# Patient Record
Sex: Female | Born: 1960 | Hispanic: No | Smoking: Never smoker
Health system: Southern US, Community
[De-identification: ages and names within clinical notes are randomized; demographics above are authoritative.]

## PROBLEM LIST (undated history)

## (undated) DIAGNOSIS — I10 Essential (primary) hypertension: Secondary | ICD-10-CM

## (undated) DIAGNOSIS — I82409 Acute embolism and thrombosis of unspecified deep veins of unspecified lower extremity: Secondary | ICD-10-CM

## (undated) DIAGNOSIS — Q632 Ectopic kidney: Secondary | ICD-10-CM

## (undated) DIAGNOSIS — K219 Gastro-esophageal reflux disease without esophagitis: Secondary | ICD-10-CM

## (undated) DIAGNOSIS — T7840XA Allergy, unspecified, initial encounter: Secondary | ICD-10-CM

## (undated) DIAGNOSIS — I959 Hypotension, unspecified: Secondary | ICD-10-CM

## (undated) DIAGNOSIS — R634 Abnormal weight loss: Secondary | ICD-10-CM

## (undated) DIAGNOSIS — E119 Type 2 diabetes mellitus without complications: Secondary | ICD-10-CM

## (undated) DIAGNOSIS — Z5189 Encounter for other specified aftercare: Secondary | ICD-10-CM

## (undated) DIAGNOSIS — T8610 Unspecified complication of kidney transplant: Secondary | ICD-10-CM

## (undated) DIAGNOSIS — D649 Anemia, unspecified: Secondary | ICD-10-CM

## (undated) DIAGNOSIS — R011 Cardiac murmur, unspecified: Secondary | ICD-10-CM

## (undated) DIAGNOSIS — D689 Coagulation defect, unspecified: Secondary | ICD-10-CM

## (undated) DIAGNOSIS — M1990 Unspecified osteoarthritis, unspecified site: Secondary | ICD-10-CM

## (undated) DIAGNOSIS — D61818 Other pancytopenia: Secondary | ICD-10-CM

## (undated) DIAGNOSIS — I639 Cerebral infarction, unspecified: Secondary | ICD-10-CM

## (undated) DIAGNOSIS — N184 Chronic kidney disease, stage 4 (severe): Secondary | ICD-10-CM

## (undated) DIAGNOSIS — H269 Unspecified cataract: Secondary | ICD-10-CM

## (undated) DIAGNOSIS — N189 Chronic kidney disease, unspecified: Secondary | ICD-10-CM

## (undated) DIAGNOSIS — R569 Unspecified convulsions: Secondary | ICD-10-CM

## (undated) DIAGNOSIS — T451X5A Adverse effect of antineoplastic and immunosuppressive drugs, initial encounter: Secondary | ICD-10-CM

## (undated) HISTORY — DX: Allergy, unspecified, initial encounter: T78.40XA

## (undated) HISTORY — DX: Hypercalcemia: E83.52

## (undated) HISTORY — DX: Hypomagnesemia: E83.42

## (undated) HISTORY — DX: Chronic kidney disease, unspecified: N18.9

## (undated) HISTORY — DX: Cerebral infarction, unspecified: I63.9

## (undated) HISTORY — DX: Anemia, unspecified: D64.9

## (undated) HISTORY — DX: Ectopic kidney: Q63.2

## (undated) HISTORY — DX: Acute embolism and thrombosis of unspecified deep veins of unspecified lower extremity: I82.409

## (undated) HISTORY — DX: Unspecified cataract: H26.9

## (undated) HISTORY — PX: OTHER SURGICAL HISTORY: SHX169

## (undated) HISTORY — DX: Hypotension, unspecified: I95.9

## (undated) HISTORY — DX: Encounter for other specified aftercare: Z51.89

## (undated) HISTORY — DX: Cardiac murmur, unspecified: R01.1

## (undated) HISTORY — DX: Coagulation defect, unspecified: D68.9

## (undated) HISTORY — DX: Gastro-esophageal reflux disease without esophagitis: K21.9

## (undated) HISTORY — PX: URETER SURGERY: SHX823

## (undated) HISTORY — DX: Unspecified osteoarthritis, unspecified site: M19.90

## (undated) HISTORY — DX: Unspecified convulsions: R56.9

## (undated) HISTORY — DX: Type 2 diabetes mellitus without complications: E11.9

## (undated) HISTORY — DX: Essential (primary) hypertension: I10

---

## 1991-03-01 HISTORY — PX: PERITONEAL CATHETER INSERTION: SHX2223

## 2002-02-28 DIAGNOSIS — I639 Cerebral infarction, unspecified: Secondary | ICD-10-CM

## 2002-02-28 HISTORY — DX: Cerebral infarction, unspecified: I63.9

## 2003-03-01 HISTORY — PX: KIDNEY TRANSPLANT: SHX239

## 2009-02-28 DIAGNOSIS — R569 Unspecified convulsions: Secondary | ICD-10-CM

## 2009-02-28 HISTORY — DX: Unspecified convulsions: R56.9

## 2010-09-02 ENCOUNTER — Emergency Department (HOSPITAL_COMMUNITY): Payer: Medicare Other

## 2010-09-02 ENCOUNTER — Inpatient Hospital Stay (HOSPITAL_COMMUNITY)
Admission: EM | Admit: 2010-09-02 | Discharge: 2010-09-07 | DRG: 683 | Disposition: A | Discharge status: Home / Self Care | Payer: Medicare Other | Attending: Internal Medicine | Admitting: Internal Medicine

## 2010-09-02 DIAGNOSIS — R0609 Other forms of dyspnea: Secondary | ICD-10-CM | POA: Diagnosis present

## 2010-09-02 DIAGNOSIS — N185 Chronic kidney disease, stage 5: Secondary | ICD-10-CM | POA: Diagnosis present

## 2010-09-02 DIAGNOSIS — E872 Acidosis, unspecified: Secondary | ICD-10-CM | POA: Diagnosis present

## 2010-09-02 DIAGNOSIS — Z8673 Personal history of transient ischemic attack (TIA), and cerebral infarction without residual deficits: Secondary | ICD-10-CM

## 2010-09-02 DIAGNOSIS — K439 Ventral hernia without obstruction or gangrene: Secondary | ICD-10-CM | POA: Diagnosis present

## 2010-09-02 DIAGNOSIS — D638 Anemia in other chronic diseases classified elsewhere: Secondary | ICD-10-CM | POA: Diagnosis present

## 2010-09-02 DIAGNOSIS — Z7982 Long term (current) use of aspirin: Secondary | ICD-10-CM

## 2010-09-02 DIAGNOSIS — N390 Urinary tract infection, site not specified: Secondary | ICD-10-CM | POA: Diagnosis present

## 2010-09-02 DIAGNOSIS — N179 Acute kidney failure, unspecified: Principal | ICD-10-CM | POA: Diagnosis present

## 2010-09-02 DIAGNOSIS — R0989 Other specified symptoms and signs involving the circulatory and respiratory systems: Secondary | ICD-10-CM | POA: Diagnosis present

## 2010-09-02 DIAGNOSIS — R197 Diarrhea, unspecified: Secondary | ICD-10-CM | POA: Diagnosis present

## 2010-09-02 DIAGNOSIS — Z9119 Patient's noncompliance with other medical treatment and regimen: Secondary | ICD-10-CM

## 2010-09-02 DIAGNOSIS — Z91199 Patient's noncompliance with other medical treatment and regimen due to unspecified reason: Secondary | ICD-10-CM

## 2010-09-02 DIAGNOSIS — E876 Hypokalemia: Secondary | ICD-10-CM | POA: Diagnosis present

## 2010-09-02 DIAGNOSIS — Z940 Kidney transplant status: Secondary | ICD-10-CM

## 2010-09-02 LAB — PROTIME-INR: INR: 1.02 (ref 0.00–1.49)

## 2010-09-02 LAB — DIFFERENTIAL
Eosinophils Absolute: 0 10*3/uL (ref 0.0–0.7)
Lymphs Abs: 1.2 10*3/uL (ref 0.7–4.0)
Monocytes Absolute: 0.2 10*3/uL (ref 0.1–1.0)
Neutrophils Relative %: 75 % (ref 43–77)

## 2010-09-02 LAB — ABO/RH: ABO/RH(D): A NEG

## 2010-09-02 LAB — CBC
HCT: 16.3 % — ABNORMAL LOW (ref 36.0–46.0)
Hemoglobin: 5.2 g/dL — CL (ref 12.0–15.0)
MCH: 24.4 pg — ABNORMAL LOW (ref 26.0–34.0)
RBC: 2.13 MIL/uL — ABNORMAL LOW (ref 3.87–5.11)

## 2010-09-02 LAB — POCT I-STAT, CHEM 8
BUN: 112 mg/dL — ABNORMAL HIGH (ref 6–23)
Creatinine, Ser: 7.1 mg/dL — ABNORMAL HIGH (ref 0.50–1.10)
Sodium: 141 mEq/L (ref 135–145)
TCO2: 6 mmol/L (ref 0–100)

## 2010-09-02 LAB — SAMPLE TO BLOOD BANK

## 2010-09-02 NOTE — H&P (Signed)
[name], [name] NO.:  [id number]  MEDICAL RECORD NO.:  [id number]  LOCATION:  [facility]                         FACILITY:  [facility]  PHYSICIAN:  Domenic Polite, MD     DATE OF BIRTH:  May 11, 1960  DATE OF ADMISSION:  09/02/2010 DATE OF DISCHARGE:                             HISTORY & PHYSICAL   PRIMARY CARE PHYSICIAN:  Unassigned at [facility] in [place], [place].  TRANSPLANT NEPHROLOGIST:  Dr. Freada Bergeron and Dr. Franchot Heidelberg, both at [facility] and [facility] in [place], [place].  CHIEF COMPLAINT:  Weakness and dyspnea on exertion.  HISTORY OF PRESENT ILLNESS:  [name] is a 50 year old female with history of chronic kidney disease, cadaveric renal transplant back in 2005, anemia of chronic disease who moved from [place] to [place] in October 2011, since then has no medical followup and blood work.  She was followed closely by the transplant physicians while she was in [place] and also used to get Procrit shots every other week for her anemia.  She reports that over the last several weeks she has noticed increased fatigue and weakness and dyspnea on exertion.  On evaluation in the ER, she was found to have a hemoglobin of 6.1 and a creatinine of 7.1.  Her last recorded creatinine back in August 2011 was 2.9 and her hemoglobin at that time was 10.2 while on Aranesp.  PAST MEDICAL HISTORY: 1. Congenital kidney disease. 2. Status post cadaveric kidney transplant in 2005. 3. History of CVA. 4. History of TIA. 5. Anemia of chronic disease supposed to be on erythropoietin shots. 6. History of peritoneal dialysis in the past. 7. Past history of ovarian cyst status post removal. 8. History of angina.  PAST SURGICAL HISTORY:  Significant for: 1. Urethral reconstruction. 2. Ovarian cyst removal. 3. C-section. 4. History of peritoneal dialysis catheter. 5. Cadaveric renal transplant in 2005.  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  No history of tobacco or alcohol use.  ALLERGIES:  ALLEGRA, CEPHALOSPORIN, and ERYTHROMYCIN.  MEDICATIONS INCLUDE: Prograf, cellcept, HCTZ, Aspirin, and supplements doses need to be confirmed.  REVIEW OF SYSTEMS:  Positive for diarrhea which has been ongoing intermittently for close to 9 months now.  PHYSICAL EXAMINATION:  VITAL SIGNS:  Temperature is 98.3, pulse is 86, blood pressure 115/63, respirations 20, satting 100% on room air. GENERAL:  This is an averagely built female, lying on the stretcher in no acute distress. HEENT:  Pupils round, reactive to light.  Extraocular movements intact. NECK:  No JVD or lymphadenopathy. CARDIOVASCULAR:  S1 and S2, regular rate and rhythm, mildly tachycardic. LUNGS:  Clear to auscultation bilaterally. ABDOMEN:  Soft, nontender.  Positive bowel sounds.  No organomegaly. EXTREMITIES:  With no edema, clubbing, or cyanosis.  REVIEW OF LABORATORY DATA:  White count of 5.7, hemoglobin 6.1, platelets 227, MCV is 76.  Differential is normal.  Coags normal.  BMET: Sodium of 141, potassium 3.0, chloride 120, bicarb of 6, BUN 112, creatinine 7.1, ionized calcium 0.9.  ASSESSMENT AND PLAN:  [name] is a 50 year old female with: 1. Acute renal failure, progressive chronic kidney disease. 2. Metabolic acidosis 3. Status post cadaveric kidney transplant. 4. Anemia of chronic disease, w/ possible Iron deficiency and reported suspicion of Myeloma 4. Prior history of suspicion of light chain disease(Myeloma). 5. History of transient ischemic attack. 6. History of chronic diarrhea for 9 months. 7. Noncompliance w/ medical follow up.  PLAN: 1. We will admit her to the telemetry floor, check anemia panel, type     and cross and transfuse 1 unit of leukoreduced PRBC, check SPEP,      and check an immunofixation, free light chain assays.   Also, for her acute renal failure,     put her on IV fluids w/ bicarb, get an ultrasound of her transplant     kidney, strict I/Os, check a Prograf level, and get a Renal consult. 2. For her diarrhea, it is possible that it could be secondary to her     CellCept; however, because she is immunosuppressed, we will check     for Cryptosporidium, Isospora, etc.   Further management as condition evolves.   At some point, this patient will best be served in a tertiary transplant facility.     Domenic Polite, MD     PJ/MEDQ  D:  09/02/2010  T:  09/02/2010  Job:  [id number]  Electronically Signed by Domenic Polite  on 09/02/2010 10:35:56 PM

## 2010-09-03 ENCOUNTER — Inpatient Hospital Stay (HOSPITAL_COMMUNITY): Payer: Medicare Other

## 2010-09-03 ENCOUNTER — Encounter (HOSPITAL_COMMUNITY): Payer: PRIVATE HEALTH INSURANCE

## 2010-09-03 LAB — CBC
Hemoglobin: 6.3 g/dL — CL (ref 12.0–15.0)
MCHC: 33.2 g/dL (ref 30.0–36.0)
RBC: 2.33 MIL/uL — ABNORMAL LOW (ref 3.87–5.11)

## 2010-09-03 LAB — BASIC METABOLIC PANEL
BUN: 84 mg/dL — ABNORMAL HIGH (ref 6–23)
BUN: 84 mg/dL — ABNORMAL HIGH (ref 6–23)
CO2: 17 mEq/L — ABNORMAL LOW (ref 19–32)
Calcium: 6.5 mg/dL — ABNORMAL LOW (ref 8.4–10.5)
Chloride: 105 mEq/L (ref 96–112)
Creatinine, Ser: 5.88 mg/dL — ABNORMAL HIGH (ref 0.50–1.10)
Creatinine, Ser: 5.56 mg/dL — ABNORMAL HIGH (ref 0.50–1.10)
GFR calc non Af Amer: 7 mL/min — ABNORMAL LOW (ref >60)
GFR calc non Af Amer: 8 mL/min — ABNORMAL LOW (ref >60)
Glucose, Bld: 101 mg/dL — ABNORMAL HIGH (ref 70–99)
Glucose, Bld: 110 mg/dL — ABNORMAL HIGH (ref 70–99)
Potassium: 3.3 mEq/L — ABNORMAL LOW (ref 3.5–5.1)
Sodium: 137 mEq/L (ref 135–145)
Sodium: 140 mEq/L (ref 135–145)

## 2010-09-03 LAB — URINALYSIS, ROUTINE W REFLEX MICROSCOPIC
Nitrite: POSITIVE — AB
Specific Gravity, Urine: 1.009 (ref 1.005–1.030)
Urobilinogen, UA: 0.2 mg/dL (ref 0.0–1.0)

## 2010-09-03 LAB — HEPATIC FUNCTION PANEL
ALT: 13 U/L (ref 0–35)
AST: 15 U/L (ref 0–37)
Bilirubin, Direct: 0.1 mg/dL (ref 0.0–0.3)
Total Protein: 5.8 g/dL — ABNORMAL LOW (ref 6.0–8.3)

## 2010-09-03 LAB — URINE MICROSCOPIC-ADD ON

## 2010-09-03 LAB — IRON AND TIBC: UIBC: <55 ug/dL

## 2010-09-03 LAB — LACTATE DEHYDROGENASE: LDH: 300 U/L — ABNORMAL HIGH (ref 94–250)

## 2010-09-03 LAB — MRSA PCR SCREENING: MRSA by PCR: NEGATIVE

## 2010-09-04 LAB — CBC
HCT: 22.2 % — ABNORMAL LOW (ref 36.0–46.0)
Hemoglobin: 8.1 g/dL — ABNORMAL LOW (ref 12.0–15.0)
Hemoglobin: 8.7 g/dL — ABNORMAL LOW (ref 12.0–15.0)
MCH: 28.9 pg (ref 26.0–34.0)
MCH: 27.5 pg (ref 26.0–34.0)
MCHC: 36.5 g/dL — ABNORMAL HIGH (ref 30.0–36.0)
MCHC: 34.5 g/dL (ref 30.0–36.0)

## 2010-09-04 LAB — BASIC METABOLIC PANEL
BUN: 81 mg/dL — ABNORMAL HIGH (ref 6–23)
BUN: 79 mg/dL — ABNORMAL HIGH (ref 6–23)
CO2: 20 mEq/L (ref 19–32)
Calcium: 6.2 mg/dL — CL (ref 8.4–10.5)
Chloride: 107 mEq/L (ref 96–112)
Creatinine, Ser: 5.04 mg/dL — ABNORMAL HIGH (ref 0.50–1.10)
Creatinine, Ser: 4.95 mg/dL — ABNORMAL HIGH (ref 0.50–1.10)
GFR calc non Af Amer: 9 mL/min — ABNORMAL LOW (ref >60)
Glucose, Bld: 139 mg/dL — ABNORMAL HIGH (ref 70–99)
Sodium: 143 mEq/L (ref 135–145)

## 2010-09-04 LAB — POCT OCCULT BLOOD STOOL (DEVICE): Fecal Occult Bld: NEGATIVE

## 2010-09-04 LAB — RENAL FUNCTION PANEL
BUN: 83 mg/dL — ABNORMAL HIGH (ref 6–23)
Calcium: 5.6 mg/dL — CL (ref 8.4–10.5)
Creatinine, Ser: 5.3 mg/dL — ABNORMAL HIGH (ref 0.50–1.10)
Glucose, Bld: 141 mg/dL — ABNORMAL HIGH (ref 70–99)
Phosphorus: 0.8 mg/dL — CL (ref 2.3–4.6)

## 2010-09-04 LAB — URINE CULTURE
Colony Count: NO GROWTH
Culture  Setup Time: 201207061643

## 2010-09-04 LAB — PHOSPHORUS: Phosphorus: 2.4 mg/dL (ref 2.3–4.6)

## 2010-09-04 LAB — MAGNESIUM: Magnesium: 1 mg/dL — ABNORMAL LOW (ref 1.5–2.5)

## 2010-09-05 LAB — RENAL FUNCTION PANEL
Albumin: 2.8 g/dL — ABNORMAL LOW (ref 3.5–5.2)
Calcium: 5.8 mg/dL — CL (ref 8.4–10.5)
Creatinine, Ser: 4.95 mg/dL — ABNORMAL HIGH (ref 0.50–1.10)
GFR calc non Af Amer: 9 mL/min — ABNORMAL LOW (ref >60)
Phosphorus: 2.7 mg/dL (ref 2.3–4.6)

## 2010-09-05 LAB — CBC
MCH: 27.7 pg (ref 26.0–34.0)
MCHC: 34.2 g/dL (ref 30.0–36.0)
MCV: 81.1 fL (ref 78.0–100.0)
Platelets: 171 10*3/uL (ref 150–400)
RDW: 16.1 % — ABNORMAL HIGH (ref 11.5–15.5)
WBC: 6.7 10*3/uL (ref 4.0–10.5)

## 2010-09-06 LAB — KAPPA/LAMBDA LIGHT CHAINS
Kappa free light chain: 9.79 mg/dL — ABNORMAL HIGH (ref 0.33–1.94)
Kappa, lambda light chain ratio: 1.1 (ref 0.26–1.65)
Lambda free light chains: 8.89 mg/dL — ABNORMAL HIGH (ref 0.57–2.63)

## 2010-09-06 LAB — CROSSMATCH
ABO/RH(D): A NEG
Unit division: 0
Unit division: 0

## 2010-09-06 LAB — CBC
HCT: 23.5 % — ABNORMAL LOW (ref 36.0–46.0)
MCH: 27.6 pg (ref 26.0–34.0)
MCV: 83 fL (ref 78.0–100.0)
Platelets: 167 10*3/uL (ref 150–400)
RDW: 16.6 % — ABNORMAL HIGH (ref 11.5–15.5)

## 2010-09-06 LAB — RENAL FUNCTION PANEL
Albumin: 2.9 g/dL — ABNORMAL LOW (ref 3.5–5.2)
BUN: 72 mg/dL — ABNORMAL HIGH (ref 6–23)
Calcium: 6.8 mg/dL — ABNORMAL LOW (ref 8.4–10.5)
Creatinine, Ser: 4.6 mg/dL — ABNORMAL HIGH (ref 0.50–1.10)
GFR calc non Af Amer: 10 mL/min — ABNORMAL LOW (ref >60)

## 2010-09-06 LAB — GIARDIA/CRYPTOSPORIDIUM SCREEN(EIA): Cryptosporidium Screen (EIA): NEGATIVE

## 2010-09-07 LAB — PROTEIN ELECTROPH W RFLX QUANT IMMUNOGLOBULINS
Albumin ELP: 58.5 % (ref 55.8–66.1)
Alpha-1-Globulin: 7.2 % — ABNORMAL HIGH (ref 2.9–4.9)
Beta 2: 4.4 % (ref 3.2–6.5)
Total Protein ELP: 5.8 g/dL — ABNORMAL LOW (ref 6.0–8.3)

## 2010-09-07 LAB — RENAL FUNCTION PANEL
Albumin: 2.8 g/dL — ABNORMAL LOW (ref 3.5–5.2)
BUN: 63 mg/dL — ABNORMAL HIGH (ref 6–23)
Calcium: 6.8 mg/dL — ABNORMAL LOW (ref 8.4–10.5)
Glucose, Bld: 94 mg/dL (ref 70–99)
Phosphorus: 3.2 mg/dL (ref 2.3–4.6)
Potassium: 3.3 mEq/L — ABNORMAL LOW (ref 3.5–5.1)

## 2010-09-07 LAB — VITAMIN D 25 HYDROXY (VIT D DEFICIENCY, FRACTURES): Vit D, 25-Hydroxy: 19 ng/mL — ABNORMAL LOW (ref 30–89)

## 2010-09-08 LAB — VITAMIN D 1,25 DIHYDROXY
Vitamin D2 1, 25 (OH)2: <8 pg/mL
Vitamin D3 1, 25 (OH)2: 19 pg/mL

## 2010-09-09 LAB — UIFE/LIGHT CHAINS/TP QN, 24-HR UR
Alpha 1, Urine: DETECTED — AB
Free Kappa Lt Chains,Ur: 38.2 mg/dL — ABNORMAL HIGH (ref 0.14–2.42)
Free Kappa/Lambda Ratio: 3.44 ratio (ref 2.04–10.37)
Gamma Globulin, Urine: DETECTED — AB
Total Protein, Urine: 51.5 mg/dL

## 2010-09-12 NOTE — Discharge Summary (Signed)
[name], ROSDAHL                ACCOUNT NO.:  [id number]  MEDICAL RECORD NO.:  [id number]  LOCATION:  6715                         FACILITY:  [facility]  PHYSICIAN:  Niel Hummer, MD    DATE OF BIRTH:  1960/06/29  DATE OF ADMISSION:  09/02/2010 DATE OF DISCHARGE:  09/07/2010                              DISCHARGE SUMMARY   DISCHARGE DIAGNOSES: 1. Acute on chronic kidney disease, prerenal, component of failing     renal transplant stage V. 2. Chronic diarrhea, treated with Flagyl for presumptive Clostridium     difficile. 3. Urinary tract infection. 4. Multiple electrolyte abnormalities, hypocalcemia, hypomagnesemia,     and hypophosphatemia. 5. Anemia of chronic disease.  DISCHARGE MEDICATIONS: 1. Calcium carbonate 250 mg 1 tablet b.i.d. 2. Cipro 500 mg p.o. daily for 4 more days. 3. Magnesium oxide 400 mg p.o. b.i.d. 4. Metronidazole 500 mg p.o. every 8 hours. 5. Paricalcitol 2 mcg p.o. daily. 6. Potassium chloride 10 mEq p.o. daily. 7. Sodium bicarb 650 mg p.o. 3 times a day. 8. Aspirin 81 mg p.o. daily. 9. CellCept 500 mg 1 tablet by mouth twice daily. 10.Ferrous sulfate 325 one tablet by mouth twice daily. 11.Multivitamins 1 tablet by mouth daily. 12.Tacrolimus 1 mg 3 tablets by mouth twice daily.  MEDICATIONS STOPPED DURING HOSPITALIZATION:  Hydrochlorothiazide.  DISPOSITION AND FOLLOWUP:  [name] will need to follow up with Dr. Posey Pronto on September 16, 2010, but she will also get lab work this next Monday and will need to fax to Dr. Serita Grit office.  She will need to follow with her primary care physician.  If her diarrhea continues, she might need a colonoscopy.  During her appointment with Dr. Posey Pronto, will need to assess for urinary tract infection and the need for chronic antibiotic suppressive therapy.  She will need a hemoglobin level to follow anemia.  Also, vitamin D level and protein electrophoresis will need to be followed.  She might need referral to  hematologist depending on those results.  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  She most recently has had baseline creatinine ranging from mid to lower 2 range as per history.  She stated that she has received routine transplant care in [place] on an annual basis for the last few years and has been contemplating moving permanently to [place] to be close to her son.  She was admitted yesterday with about 2-3 weeks' history of increasing weakness with poor appetite and a 70-month history of diarrhea.  She states that she has had this diarrhea since October 2011, which is the duration of time that she has been here in [place].  She reports diminished appetite at least for the past 2 weeks.  No vomiting, hematochezia, or melena.  She is taking hydrochlorothiazide daily.  HOSPITAL COURSE: 1. Acute on chronic renal failure.  The patient was admitted to the     Step-Down Unit.  She was started on IV fluid and bicarb drip.  Over  course of hospitalization, she was on normal saline lock.  Her     bicarb on admission was less than 7 with IV bicarb increased to     normal range.  On the day of discharge, her bicarb was 18, so she     was started on sodium bicarb.  Initially, her acute on chronic     renal failure was thought secondary to hemodynamically mediated due     to dehydration and poor oral intake.  She had an ultrasound that     was negative.  Also, there was a consideration that this is also     progression of the chronic renal failure and failing renal     transplant.  Dr. Jonnie Finner spoke with one of her transplant doctor at     [place] and it seems that the patient's creatinine was at 3 last     fall.  Before that, it was 2-2.5 range.   The patient's Prograf     level was at 7 this admission. We will continue with the same Prograf dose.  We     will continue with her immunosuppressive therapy.  She received 3     days of IV Solu-Medrol.  The patient's renal function has     stabilized in the 4.3 range, but this is likely stage V per Dr.     Sanda Klein recommendation.  The patient will follow up with Dr. Posey Pronto     on September 16, 2010.  She will have lab work done on Monday and this     result will be faxed to Dr. Serita Grit office.  Also, the patient had     multiple electrolytes abnormality, hypomagnesemia, hypocalcemia,     and hypophosphatemia, so phosphate was repleted and it was     normalized.  She continued to have low calcium level.  She received     multiple doses IV.  She will be discharged on oral supplement and     she was started on Zemplar.  For hypomagnesemia, she will be     discharged on magnesium oxide.  She did not have any worsening of     her diarrhea with the magnesium oxide. 2. Diarrhea, chronic.  The patient had Giardia negative.  C. diff was     negative.  Her diarrhea has improved on empiric treatment with     Flagyl.  She was taking clindamycin for suppressive therapy for     history of urinary tract infection.  Her C. diff was negative, but     I will give her 7 more days of Flagyl treatment empirically.  Her     diarrhea has improved.  I cannot discard that her diarrhea was     secondary to antibiotic use.  She will need to follow up with her     primary care physician and if this diarrhea persists she will need     a screening colonoscopy. 3. Urinary tract infection.  The patient had a UA with white blood     cell 21-50 and positive nitrate.  She was treated empirically with     ciprofloxacin IV for 4 days, then this was transitioned to p.o.  I     will give her 4 more days of oral antibiotics.  She will need to     follow with her nephrologist and primary care physician to     determine if she  needs long-term suppressive therapy.  I will avoid  continued antibiotics for long term at this time due to frequent     diarrhea to avoid a C. diff infection.  She will need a repeat UA.     Also, her UA did not grow anything. 4. Anemia.  This is likely anemia of chronic disease.  The patient has     not been receiving any epoetin for a while.  She received a total     of 3 units of packed red blood cell.  On admission, her hemoglobin     was 6.3.  It then stabilized at 8.  It seems that her baseline is     8.  The day prior to discharge, her hemoglobin was 7.8.  She was     feeling fine and she was asymptomatic.  She was started on ferrous     sulfate.  She was also started on darbepoetin.  She received a dose     of darbepoetin on September 05, 2010.  She will need this weekly and she     will need to follow up with her nephrologist for this. 5. Acute on chronic renal failure.  The patient had a questionable     history of multiple myeloma.  She had a bone scan that was     negative.  She had a kappa/lambda assay that was 9.7.  Suspicion     for multiple myeloma is less likely.  She will follow up with her     nephrologist for further evaluation to see if the patient will need     to be referred to hematologist.  The urine electrophoresis is     pending at this time.  Nephrologist will follow this result.  On the day of discharge, the patient was in improved condition.  Blood pressure 107/69, sat 99 on room air, respirations 20, pulse 93, and temperature 98.  DISCHARGE LABORATORY DATA:  Sodium 146, potassium 3.3, chloride 114, bicarb 18, glucose 94, BUN 63, creatinine 4.3, mag 1.1.  Kappa  9.7. Hemoglobin 7.8, white blood cell 6.3, and platelet 167.     Niel Hummer, MD     BR/MEDQ  D:  09/07/2010  T:  09/08/2010  Job:  [id number]  Electronically Signed by Niel Hummer MD on 09/12/2010 07:24:03 PM

## 2010-10-13 NOTE — Consult Note (Signed)
[name], [name] NO.:  [id number]  MEDICAL RECORD NO.:  [id number]  LOCATION:  2606                         FACILITY:  [facility]  PHYSICIAN:  Elmarie Shiley, MD          DATE OF BIRTH:  May 17, 1960  DATE OF CONSULTATION:  09/03/2010 DATE OF DISCHARGE:                                [place]   Nephrology is consulted by Dr. Niel Hummer of [facility] for the assistance with management of acute renal failure on chronic kidney disease, stage III transplant.  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  She most recently has had baseline creatinine ranging in the mid to lower two range.  She states that she has received routine transplant care in [place] on an annual basis for the last few years and has been contemplating moving permanently to the [place] area to be close to her son.  She was admitted yesterday with about 2-3 week history of increasing weakness and lethargy with poor appetite and a 41-month history of diarrhea.  She states that she has had this diarrhea since October 2011, which is a duration of time that she has been here in [place].  She reports diminished appetite at least for the last 2 weeks ingesting only about 16 fluid ounces per day.  She denies any nausea or vomiting and denies any hematochezia, melena or hematemesis. She does state that she was aberrantly taking her hydrochlorothiazide 12.5 mg p.o. daily and not missing any doses of her anti-rejection medications.  With regards to her renal transplant, she was previously on peritoneal dialysis for 13 years prior to getting a cadaveric kidney transplant. She was induced with Alemtuzumab and received only 3 doses of  Solu-Medrol postoperatively.  She has been on a steroid-free regime.  She states that prior to April 2011, her creatinine used to range from 1.6-1.8, however in April, she was admitted to a hospital in [place] with pyelonephritis where creatinine peaked at about 5.0 and upon discharge never really got back to its baseline and has been ranging from about 2- 2.5.  Apparently, a recorded creatinine which was verified on admission was 2.9 in August 2011.  Back in April 2011, when she was admitted to [facility] in [facility], she was diagnosed to have a pyelonephritis.  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  At that time, plans were made to do a bone marrow biopsy.  However, she elected to have this done in [place].  Upon reaching [place], repeat serum and urine free light chain assay was done and at that time found to be negative and bone marrow biopsy not done.  She has been anemic at least as far back as August 2011, and occasionally follows up with [facility] in [place], [place].  At some point, was on Aranesp therapy.  With regards to her  transplant allograft, she reports that she has had about 4-5 allograft biopsies after having slow to recover renal function after transplant.  She does not recall being treated for any acute rejection episodes.  PAST MEDICAL HISTORY: 1. Status post cadaveric kidney transplant in 2005.  Currently,     chronic kidney disease, stage III transplant. 2. End-stage renal disease from congenitally atrophic kidney-pelvic     kidney previously on peritoneal dialysis for 13 years (1993-2005). 3. History of cerebrovascular accident. 4. History of transient ischemic attack. 5. Anemia of chronic disease. 6. 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 7. History of ovarian cyst, status post oophorectomy. 8. History of  ACS  MEDICATIONS: 1. CellCept 500 mg p.o. b.i.d. 2. Prograf 3 mg p.o. b.i.d. 3. Hydrochlorothiazide 12.5 mg p.o. daily. 4. Aspirin 325 mg p.o. daily. 5. Clindamycin topical as needed. 6. Ferrous sulfate 325 mg p.o. b.i.d. 7. Multivitamin daily. 8. Sodium bicarbonate 650 mg 2 tablets a day.  ALLERGIES:  CEPHALOSPORINS cause nausea and vomiting, ERYTHROMYCIN causes nausea and vomiting.  SOCIAL HISTORY:  Divorced and currently resides in [place] to be close to her son.  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  She is also Engineer, maintenance.  Denies any tobacco, alcohol or illicit drug abuse.  FAMILY HISTORY:  Noncontributory.  REVIEW OF SYSTEMS:  Negative other than the HPI listed above for all 14 systems reviewed.  PHYSICAL EXAMINATION:  VITAL SIGNS:  Blood pressure 101/74, heart rate of 95 beats per minute, respiratory rate of 17, temperature 98.6 degrees Fahrenheit and oxygen saturation 98% on room air.  GENERAL EVALUATION: Medium build middle-aged [place] American woman resting comfortably in bed. HEAD, NECK AND ENT SYSTEMS:  Head is normocephalic and atraumatic. Pupils are bilaterally equal and reactive to light.  Extraocular muscle movements are normal. NECK:  Supple without any obvious JVD or goiter.  No lymphadenopathy. No bruit. CARDIOVASCULAR ASSESSMENT:  Pulse is currently regular in rate and rhythm.  Heart sounds S1 and S2 are obscured by an ejection systolic murmur. RESPIRATORY EVALUATION:  Both lung fields are clear to auscultation.  No rales, retractions or rhonchi. ABDOMEN:  Soft, flat, mild tenderness over the allograft site.  No obvious organomegaly.  Bowel sounds are normal. EXTREMITIES:  Trace to 1+ edema is present by pedally. SKIN:  No obvious rash, lumps or bumps.  Dry skin and dry oral mucosa is appreciated.  LABORATORY DATA:  Sodium 137, potassium 3.3, bicarbonate 7, BUN  88, (creatinine 6.2 down from 7.1 yesterday), calcium 6.8, hemoglobin 6.3, hematocrit 19, white cell count 5700, platelet count 186,000, ferritin 716.  Iron saturation too low to calculate.  Urinalysis shows a specific gravity of 1009, large amount of leukocyte esterase, positive nitrites, small amount of blood, urine microscopy shows 25-30 white cells per high- power field.  Urine pH is 7.0.  Red cells 0-2 are seen per high-power field.  RADIOLOGICAL EVALUATIONS:  Renal ultrasound of the allograft was done that shows echogenic allograft kidney with slight loss of cortical medullary differentiation.  No hydronephrosis is seen.  No obvious parenchymal masses or cyst are seen.  SKELETAL SURVEY:  Pending.  ASSESSMENT AND PLAN: 1. Acute renal failure on chronic kidney disease, stage III     transplant.  Suspect that her acute renal insufficiency is     primarily volume driven based on the history and timeline of     events.  Her allograft ultrasound was done and  is negative for any     pathological obstruction and shows echogenicity consistent with     chronic kidney disease.  I would continue aggressive intravenous     fluid resuscitation with isotonic sodium bicarbonate given her     history of GI losses that appears to be chronic.  She does have     significant metabolic acidosis that should be further aided by this     isotonic bicarbonate.  I would replete potassium anticipating some     intercellular shifts and agree with discontinuation of     hydrochlorothiazide.  I will continue her Prograf and CellCept and     adjust her doses based on trough Prograf levels.  I will     empirically treat her with 3 days of pulse corticosteroids at the     low dose of 60 mg for any benefit that not suspecting any rejection     at this time.  No recent NSAID use or intravenous contrast use is     noted and there is no acute hemodialysis needs identified at this     time. 2. 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  Await serum protein electrophoresis,     serum free light chain assay as well as urine protein     electrophoresis.  Bone survey is pending. 3. Probable urinary tract infection.  Urine culture is pending,     empirically started on ciprofloxacin. 4. Diarrhea.  Clostridium difficile assay is pending and she is     currently on contact precautions.  Started empirically on p.o.     Flagyl. 5. Anemia.  Suspect anemia of chronic disease.  She is status post     packed red cells and still getting 2 more units.  She also has an     element of iron deficiency for which I will give her intravenous     iron tomorrow.  She would need further evaluation for possible GI     losses.     Elmarie Shiley, MD     JP/MEDQ  D:  09/03/2010  T:  09/04/2010  Job:  [id number]  Electronically Signed by Elmarie Shiley MD on 10/13/2010 04:16:06 PM

## 2011-03-07 ENCOUNTER — Encounter (HOSPITAL_COMMUNITY)
Admission: RE | Admit: 2011-03-07 | Discharge: 2011-03-07 | Disposition: A | Discharge status: Home / Self Care | Payer: Medicare Other | Source: Ambulatory Visit | Attending: Nephrology | Admitting: Nephrology

## 2011-03-07 DIAGNOSIS — Z940 Kidney transplant status: Secondary | ICD-10-CM | POA: Insufficient documentation

## 2011-03-07 DIAGNOSIS — N185 Chronic kidney disease, stage 5: Secondary | ICD-10-CM | POA: Insufficient documentation

## 2011-03-07 DIAGNOSIS — D638 Anemia in other chronic diseases classified elsewhere: Secondary | ICD-10-CM | POA: Insufficient documentation

## 2011-03-07 LAB — RENAL FUNCTION PANEL
Albumin: 3.8 g/dL (ref 3.5–5.2)
BUN: 29 mg/dL — ABNORMAL HIGH (ref 6–23)
CO2: 21 mEq/L (ref 19–32)
Chloride: 110 mEq/L (ref 96–112)
GFR calc non Af Amer: 16 mL/min — ABNORMAL LOW (ref >90)
Potassium: 4.8 mEq/L (ref 3.5–5.1)

## 2011-03-07 LAB — IRON AND TIBC
Iron: 186 ug/dL — ABNORMAL HIGH (ref 42–135)
Saturation Ratios: 88 % — ABNORMAL HIGH (ref 20–55)
TIBC: 212 ug/dL — ABNORMAL LOW (ref 250–470)

## 2011-03-07 MED ORDER — EPOETIN ALFA 20000 UNIT/ML IJ SOLN
INTRAMUSCULAR | Status: AC
  Administered 2011-03-07: 20000 [IU] via SUBCUTANEOUS
  Filled 2011-03-07: qty 1

## 2011-03-07 MED ORDER — EPOETIN ALFA 20000 UNIT/ML IJ SOLN
20000.0000 [IU] | INTRAMUSCULAR | Status: DC
  Administered 2011-03-07: 20000 [IU] via SUBCUTANEOUS

## 2011-03-07 NOTE — Progress Notes (Addendum)
Hemocue phoned to [facility], Dr Abel Presto CMA.  Also advised them pt complained of weakness and occasional wooziness when she walks.   Pt states she is overall ok.  She denies shortness of breath and denies seeing any frank blood.   Crystal advised Dr Florene Glen and he advises they will send stool cards to patient and to go ahead and administer injection.  Pt spoke to crystal for directions for stool card instructions.

## 2011-03-14 ENCOUNTER — Other Ambulatory Visit (HOSPITAL_COMMUNITY): Payer: Self-pay | Admitting: *Deleted

## 2011-03-15 ENCOUNTER — Encounter (HOSPITAL_COMMUNITY)
Admission: RE | Admit: 2011-03-15 | Discharge: 2011-03-15 | Disposition: A | Discharge status: Home / Self Care | Payer: Medicare Other | Source: Ambulatory Visit | Attending: Nephrology | Admitting: Nephrology

## 2011-03-15 LAB — POCT HEMOGLOBIN-HEMACUE: Hemoglobin: 6.2 g/dL — CL (ref 12.0–15.0)

## 2011-03-15 MED ORDER — EPOETIN ALFA 10000 UNIT/ML IJ SOLN
INTRAMUSCULAR | Status: AC
  Administered 2011-03-15: 10000 [IU] via SUBCUTANEOUS
  Filled 2011-03-15: qty 1

## 2011-03-15 MED ORDER — EPOETIN ALFA 20000 UNIT/ML IJ SOLN
30000.0000 [IU] | INTRAMUSCULAR | Status: DC
  Administered 2011-03-15: 20000 [IU] via SUBCUTANEOUS

## 2011-03-15 MED ORDER — EPOETIN ALFA 20000 UNIT/ML IJ SOLN
INTRAMUSCULAR | Status: AC
  Administered 2011-03-15: 20000 [IU] via SUBCUTANEOUS
  Filled 2011-03-15: qty 1

## 2011-03-15 NOTE — Progress Notes (Signed)
Hgb 6.2 phoned to Dr Posey Pronto via [facility].  Order received to increase procrit to 30,000 units every week and advise patient if her hgb is still low at next appointment, she will likely have blood transfusion.  Patient advised and she voices understanding.  She was also instructed to call her md if she notices blood in her stool and to call md or 911 if she becomes short of breath and/or develops dizziness.  Again she says she understands to pay attention to herself and seek help if needed

## 2011-03-17 LAB — TACROLIMUS LEVEL: Tacrolimus (FK506) - LabCorp: 2.4 ng/mL

## 2011-03-18 ENCOUNTER — Encounter (HOSPITAL_COMMUNITY): Payer: PRIVATE HEALTH INSURANCE

## 2011-03-23 ENCOUNTER — Ambulatory Visit (HOSPITAL_COMMUNITY)
Admission: RE | Admit: 2011-03-23 | Discharge: 2011-03-23 | Disposition: A | Discharge status: Home / Self Care | Payer: Medicare Other | Source: Ambulatory Visit | Attending: Nephrology | Admitting: Nephrology

## 2011-03-23 DIAGNOSIS — Z940 Kidney transplant status: Secondary | ICD-10-CM | POA: Insufficient documentation

## 2011-03-23 DIAGNOSIS — N185 Chronic kidney disease, stage 5: Secondary | ICD-10-CM | POA: Insufficient documentation

## 2011-03-23 DIAGNOSIS — D638 Anemia in other chronic diseases classified elsewhere: Secondary | ICD-10-CM | POA: Insufficient documentation

## 2011-03-23 MED ORDER — EPOETIN ALFA 20000 UNIT/ML IJ SOLN
30000.0000 [IU] | INTRAMUSCULAR | Status: DC
  Administered 2011-03-23: 30000 [IU] via SUBCUTANEOUS

## 2011-03-23 MED ORDER — EPOETIN ALFA 20000 UNIT/ML IJ SOLN
INTRAMUSCULAR | Status: AC
  Filled 2011-03-23: qty 1

## 2011-03-23 MED ORDER — EPOETIN ALFA 10000 UNIT/ML IJ SOLN
INTRAMUSCULAR | Status: AC
  Filled 2011-03-23: qty 1

## 2011-03-24 LAB — POCT HEMOGLOBIN-HEMACUE: Hemoglobin: 6.4 g/dL — CL (ref 12.0–15.0)

## 2011-03-25 ENCOUNTER — Encounter (HOSPITAL_COMMUNITY): Payer: PRIVATE HEALTH INSURANCE

## 2011-03-28 ENCOUNTER — Other Ambulatory Visit (HOSPITAL_COMMUNITY): Payer: Self-pay | Admitting: *Deleted

## 2011-03-30 ENCOUNTER — Encounter (HOSPITAL_COMMUNITY)
Admission: RE | Admit: 2011-03-30 | Discharge: 2011-03-30 | Disposition: A | Discharge status: Home / Self Care | Payer: Medicare Other | Source: Ambulatory Visit | Attending: Nephrology | Admitting: Nephrology

## 2011-03-30 MED ORDER — EPOETIN ALFA 10000 UNIT/ML IJ SOLN
INTRAMUSCULAR | Status: AC
  Administered 2011-03-30: 30000 [IU] via SUBCUTANEOUS
  Filled 2011-03-30: qty 1

## 2011-03-30 MED ORDER — EPOETIN ALFA 20000 UNIT/ML IJ SOLN: 30000.0000 [IU] | INTRAMUSCULAR | Status: DC

## 2011-03-30 MED ORDER — EPOETIN ALFA 20000 UNIT/ML IJ SOLN
INTRAMUSCULAR | Status: AC
  Filled 2011-03-30: qty 1

## 2011-03-30 NOTE — Progress Notes (Signed)
Hematocrit phoned to Dr Posey Pronto.  No further orders.

## 2011-04-06 ENCOUNTER — Encounter (HOSPITAL_COMMUNITY): Payer: Medicare Other

## 2011-04-08 ENCOUNTER — Encounter (HOSPITAL_COMMUNITY)
Admission: RE | Admit: 2011-04-08 | Discharge: 2011-04-08 | Disposition: A | Discharge status: Home / Self Care | Payer: Medicare Other | Source: Ambulatory Visit | Attending: Nephrology | Admitting: Nephrology

## 2011-04-08 DIAGNOSIS — Z940 Kidney transplant status: Secondary | ICD-10-CM | POA: Insufficient documentation

## 2011-04-08 DIAGNOSIS — D638 Anemia in other chronic diseases classified elsewhere: Secondary | ICD-10-CM | POA: Insufficient documentation

## 2011-04-08 DIAGNOSIS — N185 Chronic kidney disease, stage 5: Secondary | ICD-10-CM | POA: Insufficient documentation

## 2011-04-08 LAB — IRON AND TIBC: TIBC: 247 ug/dL — ABNORMAL LOW (ref 250–470)

## 2011-04-08 LAB — FERRITIN: Ferritin: 380 ng/mL — ABNORMAL HIGH (ref 10–291)

## 2011-04-08 LAB — POCT HEMOGLOBIN-HEMACUE: Hemoglobin: 8 g/dL — ABNORMAL LOW (ref 12.0–15.0)

## 2011-04-08 MED ORDER — EPOETIN ALFA 20000 UNIT/ML IJ SOLN
30000.0000 [IU] | INTRAMUSCULAR | Status: DC
  Administered 2011-04-08: 30000 [IU] via SUBCUTANEOUS
  Filled 2011-04-08: qty 1

## 2011-04-12 ENCOUNTER — Other Ambulatory Visit (HOSPITAL_COMMUNITY): Payer: Self-pay | Admitting: *Deleted

## 2011-04-13 ENCOUNTER — Encounter (HOSPITAL_COMMUNITY): Payer: Medicare Other

## 2011-04-20 ENCOUNTER — Encounter (HOSPITAL_COMMUNITY)
Admission: RE | Admit: 2011-04-20 | Discharge: 2011-04-20 | Disposition: A | Discharge status: Home / Self Care | Payer: Medicare Other | Source: Ambulatory Visit | Attending: Nephrology | Admitting: Nephrology

## 2011-04-20 MED ORDER — EPOETIN ALFA 20000 UNIT/ML IJ SOLN: 30000.0000 [IU] | INTRAMUSCULAR | Status: DC

## 2011-04-20 MED ORDER — EPOETIN ALFA 10000 UNIT/ML IJ SOLN
INTRAMUSCULAR | Status: AC
  Administered 2011-04-20: 30000 [IU] via SUBCUTANEOUS
  Filled 2011-04-20: qty 1

## 2011-04-20 MED ORDER — EPOETIN ALFA 20000 UNIT/ML IJ SOLN
INTRAMUSCULAR | Status: DC
Start: 2011-04-20 — End: 2011-04-21
  Filled 2011-04-20: qty 1

## 2011-04-27 ENCOUNTER — Encounter (HOSPITAL_COMMUNITY): Payer: Medicare Other

## 2011-05-04 ENCOUNTER — Encounter (HOSPITAL_COMMUNITY): Payer: Medicare Other

## 2011-05-11 ENCOUNTER — Encounter (HOSPITAL_COMMUNITY)
Admission: RE | Admit: 2011-05-11 | Discharge: 2011-05-11 | Disposition: A | Discharge status: Home / Self Care | Payer: Medicare Other | Source: Ambulatory Visit | Attending: Nephrology | Admitting: Nephrology

## 2011-05-11 DIAGNOSIS — N185 Chronic kidney disease, stage 5: Secondary | ICD-10-CM | POA: Insufficient documentation

## 2011-05-11 DIAGNOSIS — Z940 Kidney transplant status: Secondary | ICD-10-CM | POA: Insufficient documentation

## 2011-05-11 DIAGNOSIS — D638 Anemia in other chronic diseases classified elsewhere: Secondary | ICD-10-CM | POA: Insufficient documentation

## 2011-05-11 LAB — POCT HEMOGLOBIN-HEMACUE: Hemoglobin: 8.5 g/dL — ABNORMAL LOW (ref 12.0–15.0)

## 2011-05-11 LAB — IRON AND TIBC
Saturation Ratios: 23 % (ref 20–55)
TIBC: 219 ug/dL — ABNORMAL LOW (ref 250–470)
UIBC: 168 ug/dL (ref 125–400)

## 2011-05-11 LAB — FERRITIN: Ferritin: 271 ng/mL (ref 10–291)

## 2011-05-11 MED ORDER — EPOETIN ALFA 10000 UNIT/ML IJ SOLN
INTRAMUSCULAR | Status: AC
  Administered 2011-05-11: 30000 [IU]
  Filled 2011-05-11: qty 1

## 2011-05-11 MED ORDER — EPOETIN ALFA 20000 UNIT/ML IJ SOLN: 30000.0000 [IU] | INTRAMUSCULAR | Status: DC

## 2011-05-11 MED ORDER — EPOETIN ALFA 20000 UNIT/ML IJ SOLN
INTRAMUSCULAR | Status: AC
  Filled 2011-05-11: qty 1

## 2011-05-18 ENCOUNTER — Encounter (HOSPITAL_COMMUNITY)
Admission: RE | Admit: 2011-05-18 | Discharge: 2011-05-18 | Disposition: A | Discharge status: Home / Self Care | Payer: Medicare Other | Source: Ambulatory Visit | Attending: Nephrology | Admitting: Nephrology

## 2011-05-18 MED ORDER — EPOETIN ALFA 10000 UNIT/ML IJ SOLN
INTRAMUSCULAR | Status: AC
  Filled 2011-05-18: qty 1

## 2011-05-18 MED ORDER — EPOETIN ALFA 20000 UNIT/ML IJ SOLN
30000.0000 [IU] | INTRAMUSCULAR | Status: DC
  Administered 2011-05-18: 30000 [IU] via SUBCUTANEOUS

## 2011-05-18 MED ORDER — EPOETIN ALFA 20000 UNIT/ML IJ SOLN
INTRAMUSCULAR | Status: AC
  Filled 2011-05-18: qty 1

## 2011-05-25 ENCOUNTER — Encounter (HOSPITAL_COMMUNITY): Payer: Medicare Other

## 2011-05-30 ENCOUNTER — Encounter (HOSPITAL_COMMUNITY): Payer: Medicare Other

## 2011-06-01 ENCOUNTER — Encounter (HOSPITAL_COMMUNITY)
Admission: RE | Admit: 2011-06-01 | Discharge: 2011-06-01 | Disposition: A | Discharge status: Home / Self Care | Payer: Medicare Other | Source: Ambulatory Visit | Attending: Nephrology | Admitting: Nephrology

## 2011-06-01 ENCOUNTER — Encounter (HOSPITAL_COMMUNITY): Payer: Medicare Other

## 2011-06-01 DIAGNOSIS — Z940 Kidney transplant status: Secondary | ICD-10-CM | POA: Insufficient documentation

## 2011-06-01 DIAGNOSIS — N185 Chronic kidney disease, stage 5: Secondary | ICD-10-CM | POA: Insufficient documentation

## 2011-06-01 DIAGNOSIS — D638 Anemia in other chronic diseases classified elsewhere: Secondary | ICD-10-CM | POA: Insufficient documentation

## 2011-06-01 MED ORDER — EPOETIN ALFA 10000 UNIT/ML IJ SOLN
INTRAMUSCULAR | Status: AC
  Administered 2011-06-01: 30000 [IU]
  Filled 2011-06-01: qty 1

## 2011-06-01 MED ORDER — EPOETIN ALFA 20000 UNIT/ML IJ SOLN
INTRAMUSCULAR | Status: DC
Start: 2011-06-01 — End: 2011-06-02
  Filled 2011-06-01: qty 1

## 2011-06-01 MED ORDER — EPOETIN ALFA 20000 UNIT/ML IJ SOLN: 30000.0000 [IU] | INTRAMUSCULAR | Status: DC

## 2011-06-08 ENCOUNTER — Encounter (HOSPITAL_COMMUNITY)
Admission: RE | Admit: 2011-06-08 | Discharge: 2011-06-08 | Disposition: A | Discharge status: Home / Self Care | Payer: Medicare Other | Source: Ambulatory Visit | Attending: Nephrology | Admitting: Nephrology

## 2011-06-08 LAB — POCT HEMOGLOBIN-HEMACUE: Hemoglobin: 8.6 g/dL — ABNORMAL LOW (ref 12.0–15.0)

## 2011-06-08 LAB — IRON AND TIBC: Iron: 44 ug/dL (ref 42–135)

## 2011-06-08 LAB — FERRITIN: Ferritin: 224 ng/mL (ref 10–291)

## 2011-06-08 MED ORDER — EPOETIN ALFA 20000 UNIT/ML IJ SOLN
30000.0000 [IU] | INTRAMUSCULAR | Status: DC
  Administered 2011-06-08: 30000 [IU] via SUBCUTANEOUS

## 2011-06-08 MED ORDER — EPOETIN ALFA 20000 UNIT/ML IJ SOLN
INTRAMUSCULAR | Status: AC
  Filled 2011-06-08: qty 1

## 2011-06-08 MED ORDER — EPOETIN ALFA 10000 UNIT/ML IJ SOLN
INTRAMUSCULAR | Status: AC
  Filled 2011-06-08: qty 1

## 2011-06-13 ENCOUNTER — Other Ambulatory Visit (HOSPITAL_COMMUNITY): Payer: Self-pay | Admitting: *Deleted

## 2011-06-15 ENCOUNTER — Encounter (HOSPITAL_COMMUNITY)
Admission: RE | Admit: 2011-06-15 | Discharge: 2011-06-15 | Disposition: A | Discharge status: Home / Self Care | Payer: Medicare Other | Source: Ambulatory Visit | Attending: Nephrology | Admitting: Nephrology

## 2011-06-15 LAB — POCT HEMOGLOBIN-HEMACUE: Hemoglobin: 9.1 g/dL — ABNORMAL LOW (ref 12.0–15.0)

## 2011-06-15 MED ORDER — EPOETIN ALFA 20000 UNIT/ML IJ SOLN
30000.0000 [IU] | INTRAMUSCULAR | Status: DC
  Administered 2011-06-15: 20000 [IU] via SUBCUTANEOUS

## 2011-06-15 MED ORDER — EPOETIN ALFA 20000 UNIT/ML IJ SOLN
INTRAMUSCULAR | Status: AC
  Administered 2011-06-15: 20000 [IU] via SUBCUTANEOUS
  Filled 2011-06-15: qty 1

## 2011-06-15 MED ORDER — EPOETIN ALFA 10000 UNIT/ML IJ SOLN
INTRAMUSCULAR | Status: AC
  Administered 2011-06-15: 10000 [IU] via SUBCUTANEOUS
  Filled 2011-06-15: qty 1

## 2011-06-23 ENCOUNTER — Encounter (HOSPITAL_COMMUNITY)
Admission: RE | Admit: 2011-06-23 | Discharge: 2011-06-23 | Disposition: A | Discharge status: Home / Self Care | Payer: Medicare Other | Source: Ambulatory Visit | Attending: Nephrology | Admitting: Nephrology

## 2011-06-23 LAB — POCT HEMOGLOBIN-HEMACUE: Hemoglobin: 9.1 g/dL — ABNORMAL LOW (ref 12.0–15.0)

## 2011-06-23 MED ORDER — EPOETIN ALFA 10000 UNIT/ML IJ SOLN
INTRAMUSCULAR | Status: AC
  Administered 2011-06-23: 10000 [IU]
  Filled 2011-06-23: qty 1

## 2011-06-23 MED ORDER — EPOETIN ALFA 20000 UNIT/ML IJ SOLN
30000.0000 [IU] | INTRAMUSCULAR | Status: DC
  Administered 2011-06-23: 20000 [IU] via SUBCUTANEOUS
  Filled 2011-06-23: qty 1

## 2011-07-04 ENCOUNTER — Encounter (HOSPITAL_COMMUNITY): Payer: Medicare Other

## 2011-07-06 ENCOUNTER — Encounter (HOSPITAL_COMMUNITY): Payer: Medicare Other

## 2011-07-13 ENCOUNTER — Encounter (HOSPITAL_COMMUNITY)
Admission: RE | Admit: 2011-07-13 | Discharge: 2011-07-13 | Disposition: A | Discharge status: Home / Self Care | Payer: Medicare Other | Source: Ambulatory Visit | Attending: Nephrology | Admitting: Nephrology

## 2011-07-13 DIAGNOSIS — N185 Chronic kidney disease, stage 5: Secondary | ICD-10-CM | POA: Insufficient documentation

## 2011-07-13 DIAGNOSIS — D638 Anemia in other chronic diseases classified elsewhere: Secondary | ICD-10-CM | POA: Insufficient documentation

## 2011-07-13 DIAGNOSIS — Z940 Kidney transplant status: Secondary | ICD-10-CM | POA: Insufficient documentation

## 2011-07-13 LAB — IRON AND TIBC
Iron: 92 ug/dL (ref 42–135)
TIBC: 224 ug/dL — ABNORMAL LOW (ref 250–470)
UIBC: 132 ug/dL (ref 125–400)

## 2011-07-13 LAB — FERRITIN: Ferritin: 222 ng/mL (ref 10–291)

## 2011-07-13 MED ORDER — EPOETIN ALFA 20000 UNIT/ML IJ SOLN
INTRAMUSCULAR | Status: AC
  Filled 2011-07-13: qty 1

## 2011-07-13 MED ORDER — EPOETIN ALFA 20000 UNIT/ML IJ SOLN: 30000.0000 [IU] | INTRAMUSCULAR | Status: DC

## 2011-07-13 MED ORDER — EPOETIN ALFA 10000 UNIT/ML IJ SOLN
INTRAMUSCULAR | Status: AC
  Administered 2011-07-13: 30000 [IU]
  Filled 2011-07-13: qty 1

## 2011-07-20 ENCOUNTER — Encounter (HOSPITAL_COMMUNITY)
Admission: RE | Admit: 2011-07-20 | Discharge: 2011-07-20 | Disposition: A | Discharge status: Home / Self Care | Payer: Medicare Other | Source: Ambulatory Visit | Attending: Nephrology | Admitting: Nephrology

## 2011-07-20 LAB — POCT HEMOGLOBIN-HEMACUE: Hemoglobin: 9 g/dL — ABNORMAL LOW (ref 12.0–15.0)

## 2011-07-20 MED ORDER — EPOETIN ALFA 10000 UNIT/ML IJ SOLN
INTRAMUSCULAR | Status: AC
  Filled 2011-07-20: qty 1

## 2011-07-20 MED ORDER — EPOETIN ALFA 20000 UNIT/ML IJ SOLN
30000.0000 [IU] | INTRAMUSCULAR | Status: DC
  Administered 2011-07-20: 30000 [IU] via SUBCUTANEOUS

## 2011-07-20 MED ORDER — EPOETIN ALFA 20000 UNIT/ML IJ SOLN
INTRAMUSCULAR | Status: AC
  Filled 2011-07-20: qty 1

## 2011-08-01 ENCOUNTER — Encounter (HOSPITAL_COMMUNITY)
Admission: RE | Admit: 2011-08-01 | Discharge: 2011-08-01 | Disposition: A | Discharge status: Home / Self Care | Payer: Medicare Other | Source: Ambulatory Visit | Attending: Nephrology | Admitting: Nephrology

## 2011-08-01 DIAGNOSIS — N185 Chronic kidney disease, stage 5: Secondary | ICD-10-CM | POA: Insufficient documentation

## 2011-08-01 DIAGNOSIS — D638 Anemia in other chronic diseases classified elsewhere: Secondary | ICD-10-CM | POA: Insufficient documentation

## 2011-08-01 DIAGNOSIS — Z940 Kidney transplant status: Secondary | ICD-10-CM | POA: Insufficient documentation

## 2011-08-01 MED ORDER — EPOETIN ALFA 20000 UNIT/ML IJ SOLN
INTRAMUSCULAR | Status: AC
  Administered 2011-08-01: 20000 [IU] via SUBCUTANEOUS
  Filled 2011-08-01: qty 1

## 2011-08-01 MED ORDER — EPOETIN ALFA 20000 UNIT/ML IJ SOLN: 30000.0000 [IU] | INTRAMUSCULAR | Status: DC

## 2011-08-01 MED ORDER — EPOETIN ALFA 10000 UNIT/ML IJ SOLN
INTRAMUSCULAR | Status: AC
  Administered 2011-08-01: 10000 [IU] via SUBCUTANEOUS
  Filled 2011-08-01: qty 1

## 2011-08-08 ENCOUNTER — Encounter (HOSPITAL_COMMUNITY): Payer: Medicare Other

## 2011-08-09 ENCOUNTER — Encounter (HOSPITAL_COMMUNITY)
Admission: RE | Admit: 2011-08-09 | Discharge: 2011-08-09 | Disposition: A | Discharge status: Home / Self Care | Payer: Medicare Other | Source: Ambulatory Visit | Attending: Nephrology | Admitting: Nephrology

## 2011-08-09 LAB — POCT HEMOGLOBIN-HEMACUE: Hemoglobin: 8.7 g/dL — ABNORMAL LOW (ref 12.0–15.0)

## 2011-08-09 LAB — IRON AND TIBC: UIBC: 163 ug/dL (ref 125–400)

## 2011-08-09 MED ORDER — EPOETIN ALFA 20000 UNIT/ML IJ SOLN
INTRAMUSCULAR | Status: AC
  Filled 2011-08-09: qty 1

## 2011-08-09 MED ORDER — EPOETIN ALFA 10000 UNIT/ML IJ SOLN
INTRAMUSCULAR | Status: AC
  Administered 2011-08-09: 30000 [IU] via SUBCUTANEOUS
  Filled 2011-08-09: qty 1

## 2011-08-09 MED ORDER — EPOETIN ALFA 20000 UNIT/ML IJ SOLN: 30000.0000 [IU] | INTRAMUSCULAR | Status: DC

## 2011-08-10 LAB — FERRITIN: Ferritin: 191 ng/mL (ref 10–291)

## 2011-11-14 ENCOUNTER — Other Ambulatory Visit (HOSPITAL_COMMUNITY): Payer: Self-pay | Admitting: *Deleted

## 2011-11-15 ENCOUNTER — Encounter (HOSPITAL_COMMUNITY)
Admission: RE | Admit: 2011-11-15 | Discharge: 2011-11-15 | Disposition: A | Discharge status: Home / Self Care | Payer: Medicare Other | Source: Ambulatory Visit | Attending: Nephrology | Admitting: Nephrology

## 2011-11-15 DIAGNOSIS — Z940 Kidney transplant status: Secondary | ICD-10-CM | POA: Insufficient documentation

## 2011-11-15 DIAGNOSIS — N185 Chronic kidney disease, stage 5: Secondary | ICD-10-CM | POA: Insufficient documentation

## 2011-11-15 DIAGNOSIS — D638 Anemia in other chronic diseases classified elsewhere: Secondary | ICD-10-CM | POA: Insufficient documentation

## 2011-11-15 MED ORDER — EPOETIN ALFA 20000 UNIT/ML IJ SOLN: 30000.0000 [IU] | INTRAMUSCULAR | Status: DC

## 2011-11-15 MED ORDER — EPOETIN ALFA 40000 UNIT/ML IJ SOLN
INTRAMUSCULAR | Status: AC
  Administered 2011-11-15: 30000 [IU] via SUBCUTANEOUS
  Filled 2011-11-15: qty 1

## 2011-11-15 NOTE — Progress Notes (Signed)
Spoke with [facility] @ [facility], re: HBG 6.8; Procrit 30,000 units given today, will follow weekly

## 2011-11-16 LAB — POCT HEMOGLOBIN-HEMACUE: Hemoglobin: 6.8 g/dL — CL (ref 12.0–15.0)

## 2011-11-23 ENCOUNTER — Encounter (HOSPITAL_COMMUNITY)
Admission: RE | Admit: 2011-11-23 | Discharge: 2011-11-23 | Disposition: A | Discharge status: Home / Self Care | Payer: Medicare Other | Source: Ambulatory Visit | Attending: Nephrology | Admitting: Nephrology

## 2011-11-23 LAB — IRON AND TIBC: UIBC: 117 ug/dL — ABNORMAL LOW (ref 125–400)

## 2011-11-23 MED ORDER — EPOETIN ALFA 20000 UNIT/ML IJ SOLN
INTRAMUSCULAR | Status: AC
  Administered 2011-11-23: 20000 [IU] via SUBCUTANEOUS
  Filled 2011-11-23: qty 1

## 2011-11-23 MED ORDER — EPOETIN ALFA 20000 UNIT/ML IJ SOLN
30000.0000 [IU] | INTRAMUSCULAR | Status: DC
  Administered 2011-11-23: 20000 [IU] via SUBCUTANEOUS

## 2011-11-23 MED ORDER — EPOETIN ALFA 10000 UNIT/ML IJ SOLN
INTRAMUSCULAR | Status: AC
  Administered 2011-11-23: 10000 [IU]
  Filled 2011-11-23: qty 1

## 2011-11-30 ENCOUNTER — Encounter (HOSPITAL_COMMUNITY)
Admission: RE | Admit: 2011-11-30 | Discharge: 2011-11-30 | Disposition: A | Discharge status: Home / Self Care | Payer: Medicare Other | Source: Ambulatory Visit | Attending: Nephrology | Admitting: Nephrology

## 2011-11-30 DIAGNOSIS — Z940 Kidney transplant status: Secondary | ICD-10-CM | POA: Insufficient documentation

## 2011-11-30 DIAGNOSIS — D638 Anemia in other chronic diseases classified elsewhere: Secondary | ICD-10-CM | POA: Insufficient documentation

## 2011-11-30 DIAGNOSIS — N185 Chronic kidney disease, stage 5: Secondary | ICD-10-CM | POA: Insufficient documentation

## 2011-11-30 MED ORDER — EPOETIN ALFA 20000 UNIT/ML IJ SOLN
INTRAMUSCULAR | Status: AC
  Administered 2011-11-30: 20000 [IU] via SUBCUTANEOUS
  Filled 2011-11-30: qty 1

## 2011-11-30 MED ORDER — EPOETIN ALFA 20000 UNIT/ML IJ SOLN
30000.0000 [IU] | INTRAMUSCULAR | Status: DC
  Administered 2011-11-30: 20000 [IU] via SUBCUTANEOUS

## 2011-11-30 MED ORDER — EPOETIN ALFA 10000 UNIT/ML IJ SOLN
INTRAMUSCULAR | Status: AC
  Administered 2011-11-30: 10000 [IU] via SUBCUTANEOUS
  Filled 2011-11-30: qty 1

## 2011-12-07 ENCOUNTER — Encounter (HOSPITAL_COMMUNITY): Payer: Medicare Other

## 2011-12-07 ENCOUNTER — Encounter (HOSPITAL_COMMUNITY)
Admission: RE | Admit: 2011-12-07 | Discharge: 2011-12-07 | Disposition: A | Discharge status: Home / Self Care | Payer: Medicare Other | Source: Ambulatory Visit | Attending: Nephrology | Admitting: Nephrology

## 2011-12-07 MED ORDER — EPOETIN ALFA 20000 UNIT/ML IJ SOLN: 30000.0000 [IU] | INTRAMUSCULAR | Status: DC

## 2011-12-07 MED ORDER — EPOETIN ALFA 10000 UNIT/ML IJ SOLN
INTRAMUSCULAR | Status: AC
  Administered 2011-12-07: 10000 [IU] via SUBCUTANEOUS
  Filled 2011-12-07: qty 1

## 2011-12-07 MED ORDER — EPOETIN ALFA 20000 UNIT/ML IJ SOLN
INTRAMUSCULAR | Status: AC
  Administered 2011-12-07: 20000 [IU] via SUBCUTANEOUS
  Filled 2011-12-07: qty 1

## 2011-12-15 ENCOUNTER — Encounter (HOSPITAL_COMMUNITY)
Admission: RE | Admit: 2011-12-15 | Discharge: 2011-12-15 | Disposition: A | Discharge status: Home / Self Care | Payer: Medicare Other | Source: Ambulatory Visit | Attending: Nephrology | Admitting: Nephrology

## 2011-12-15 MED ORDER — EPOETIN ALFA 10000 UNIT/ML IJ SOLN
INTRAMUSCULAR | Status: AC
  Administered 2011-12-15: 10000 [IU]
  Filled 2011-12-15: qty 1

## 2011-12-15 MED ORDER — EPOETIN ALFA 20000 UNIT/ML IJ SOLN
INTRAMUSCULAR | Status: AC
  Administered 2011-12-15: 20000 [IU]
  Filled 2011-12-15: qty 1

## 2011-12-15 MED ORDER — EPOETIN ALFA 20000 UNIT/ML IJ SOLN: 30000.0000 [IU] | INTRAMUSCULAR | Status: DC

## 2011-12-28 ENCOUNTER — Encounter (HOSPITAL_COMMUNITY): Payer: Medicare Other

## 2012-01-02 ENCOUNTER — Encounter (HOSPITAL_COMMUNITY)
Admission: RE | Admit: 2012-01-02 | Discharge: 2012-01-02 | Disposition: A | Discharge status: Home / Self Care | Payer: Medicare Other | Source: Ambulatory Visit | Attending: Nephrology | Admitting: Nephrology

## 2012-01-02 DIAGNOSIS — D638 Anemia in other chronic diseases classified elsewhere: Secondary | ICD-10-CM | POA: Insufficient documentation

## 2012-01-02 DIAGNOSIS — Z940 Kidney transplant status: Secondary | ICD-10-CM | POA: Insufficient documentation

## 2012-01-02 DIAGNOSIS — N185 Chronic kidney disease, stage 5: Secondary | ICD-10-CM | POA: Insufficient documentation

## 2012-01-02 LAB — IRON AND TIBC
Iron: 63 ug/dL (ref 42–135)
TIBC: 223 ug/dL — ABNORMAL LOW (ref 250–470)
UIBC: 160 ug/dL (ref 125–400)

## 2012-01-02 LAB — FERRITIN: Ferritin: 211 ng/mL (ref 10–291)

## 2012-01-02 MED ORDER — EPOETIN ALFA 10000 UNIT/ML IJ SOLN
INTRAMUSCULAR | Status: AC
  Administered 2012-01-02: 10000 [IU] via SUBCUTANEOUS
  Filled 2012-01-02: qty 1

## 2012-01-02 MED ORDER — EPOETIN ALFA 20000 UNIT/ML IJ SOLN: 30000.0000 [IU] | INTRAMUSCULAR | Status: DC

## 2012-01-02 MED ORDER — EPOETIN ALFA 20000 UNIT/ML IJ SOLN
INTRAMUSCULAR | Status: AC
  Administered 2012-01-02: 20000 [IU] via SUBCUTANEOUS
  Filled 2012-01-02: qty 1

## 2012-01-09 ENCOUNTER — Encounter (HOSPITAL_COMMUNITY)
Admission: RE | Admit: 2012-01-09 | Discharge: 2012-01-09 | Disposition: A | Discharge status: Home / Self Care | Payer: Medicare Other | Source: Ambulatory Visit | Attending: Nephrology | Admitting: Nephrology

## 2012-01-09 MED ORDER — EPOETIN ALFA 10000 UNIT/ML IJ SOLN
INTRAMUSCULAR | Status: AC
  Administered 2012-01-09: 10000 [IU] via SUBCUTANEOUS
  Filled 2012-01-09: qty 1

## 2012-01-09 MED ORDER — EPOETIN ALFA 20000 UNIT/ML IJ SOLN
INTRAMUSCULAR | Status: AC
  Administered 2012-01-09: 20000 [IU] via SUBCUTANEOUS
  Filled 2012-01-09: qty 1

## 2012-01-09 MED ORDER — EPOETIN ALFA 20000 UNIT/ML IJ SOLN: 30000.0000 [IU] | INTRAMUSCULAR | Status: DC

## 2012-01-16 ENCOUNTER — Encounter (HOSPITAL_COMMUNITY)
Admission: RE | Admit: 2012-01-16 | Discharge: 2012-01-16 | Disposition: A | Discharge status: Home / Self Care | Payer: Medicare Other | Source: Ambulatory Visit | Attending: Nephrology | Admitting: Nephrology

## 2012-01-16 LAB — POCT HEMOGLOBIN-HEMACUE: Hemoglobin: 9.8 g/dL — ABNORMAL LOW (ref 12.0–15.0)

## 2012-01-16 MED ORDER — EPOETIN ALFA 20000 UNIT/ML IJ SOLN
INTRAMUSCULAR | Status: AC
  Administered 2012-01-16: 20000 [IU] via SUBCUTANEOUS
  Filled 2012-01-16: qty 1

## 2012-01-16 MED ORDER — EPOETIN ALFA 10000 UNIT/ML IJ SOLN
INTRAMUSCULAR | Status: AC
  Administered 2012-01-16: 10000 [IU] via SUBCUTANEOUS
  Filled 2012-01-16: qty 1

## 2012-01-16 MED ORDER — EPOETIN ALFA 20000 UNIT/ML IJ SOLN
30000.0000 [IU] | INTRAMUSCULAR | Status: DC
  Administered 2012-01-16: 20000 [IU] via SUBCUTANEOUS

## 2012-01-30 ENCOUNTER — Encounter (HOSPITAL_COMMUNITY): Payer: Medicare Other

## 2012-02-09 ENCOUNTER — Encounter (HOSPITAL_COMMUNITY)
Admission: RE | Admit: 2012-02-09 | Discharge: 2012-02-09 | Disposition: A | Discharge status: Home / Self Care | Payer: Medicare Other | Source: Ambulatory Visit | Attending: Nephrology | Admitting: Nephrology

## 2012-02-09 DIAGNOSIS — N185 Chronic kidney disease, stage 5: Secondary | ICD-10-CM | POA: Insufficient documentation

## 2012-02-09 DIAGNOSIS — Z940 Kidney transplant status: Secondary | ICD-10-CM | POA: Insufficient documentation

## 2012-02-09 DIAGNOSIS — D638 Anemia in other chronic diseases classified elsewhere: Secondary | ICD-10-CM | POA: Insufficient documentation

## 2012-02-09 MED ORDER — EPOETIN ALFA 10000 UNIT/ML IJ SOLN
INTRAMUSCULAR | Status: AC
  Administered 2012-02-09: 10000 [IU] via SUBCUTANEOUS
  Filled 2012-02-09: qty 1

## 2012-02-09 MED ORDER — EPOETIN ALFA 20000 UNIT/ML IJ SOLN
30000.0000 [IU] | INTRAMUSCULAR | Status: DC
  Administered 2012-02-09: 20000 [IU] via SUBCUTANEOUS

## 2012-02-09 MED ORDER — EPOETIN ALFA 20000 UNIT/ML IJ SOLN
INTRAMUSCULAR | Status: AC
  Administered 2012-02-09: 20000 [IU] via SUBCUTANEOUS
  Filled 2012-02-09: qty 1

## 2012-02-10 LAB — IRON AND TIBC
Iron: 84 ug/dL (ref 42–135)
TIBC: 244 ug/dL — ABNORMAL LOW (ref 250–470)
UIBC: 160 ug/dL (ref 125–400)

## 2012-02-10 LAB — FERRITIN: Ferritin: 187 ng/mL (ref 10–291)

## 2012-02-14 ENCOUNTER — Other Ambulatory Visit (HOSPITAL_COMMUNITY): Payer: Self-pay | Admitting: *Deleted

## 2012-02-15 ENCOUNTER — Encounter (HOSPITAL_COMMUNITY)
Admission: RE | Admit: 2012-02-15 | Discharge: 2012-02-15 | Disposition: A | Discharge status: Home / Self Care | Payer: Medicare Other | Source: Ambulatory Visit | Attending: Nephrology | Admitting: Nephrology

## 2012-02-15 MED ORDER — EPOETIN ALFA 20000 UNIT/ML IJ SOLN
30000.0000 [IU] | INTRAMUSCULAR | Status: DC
  Administered 2012-02-15: 20000 [IU] via SUBCUTANEOUS

## 2012-02-15 MED ORDER — EPOETIN ALFA 20000 UNIT/ML IJ SOLN
INTRAMUSCULAR | Status: AC
  Filled 2012-02-15: qty 1

## 2012-02-15 MED ORDER — EPOETIN ALFA 10000 UNIT/ML IJ SOLN
INTRAMUSCULAR | Status: AC
  Administered 2012-02-15: 10000 [IU]
  Filled 2012-02-15: qty 1

## 2012-02-28 ENCOUNTER — Encounter (HOSPITAL_COMMUNITY): Payer: Medicare Other

## 2012-02-29 DIAGNOSIS — I82409 Acute embolism and thrombosis of unspecified deep veins of unspecified lower extremity: Secondary | ICD-10-CM

## 2012-02-29 HISTORY — DX: Acute embolism and thrombosis of unspecified deep veins of unspecified lower extremity: I82.409

## 2012-03-08 ENCOUNTER — Encounter (HOSPITAL_COMMUNITY): Payer: Medicare Other

## 2012-03-09 ENCOUNTER — Encounter (HOSPITAL_COMMUNITY)
Admission: RE | Admit: 2012-03-09 | Discharge: 2012-03-09 | Disposition: A | Discharge status: Home / Self Care | Payer: Medicare Other | Source: Ambulatory Visit | Attending: Nephrology | Admitting: Nephrology

## 2012-03-09 DIAGNOSIS — D638 Anemia in other chronic diseases classified elsewhere: Secondary | ICD-10-CM | POA: Insufficient documentation

## 2012-03-09 DIAGNOSIS — Z940 Kidney transplant status: Secondary | ICD-10-CM | POA: Insufficient documentation

## 2012-03-09 DIAGNOSIS — N185 Chronic kidney disease, stage 5: Secondary | ICD-10-CM | POA: Insufficient documentation

## 2012-03-09 LAB — FERRITIN: Ferritin: 187 ng/mL (ref 10–291)

## 2012-03-09 LAB — IRON AND TIBC: UIBC: 157 ug/dL (ref 125–400)

## 2012-03-09 LAB — POCT HEMOGLOBIN-HEMACUE: Hemoglobin: 10.1 g/dL — ABNORMAL LOW (ref 12.0–15.0)

## 2012-03-09 MED ORDER — EPOETIN ALFA 20000 UNIT/ML IJ SOLN: 30000.0000 [IU] | INTRAMUSCULAR | Status: DC

## 2012-03-09 MED ORDER — EPOETIN ALFA 20000 UNIT/ML IJ SOLN
INTRAMUSCULAR | Status: AC
  Administered 2012-03-09: 20000 [IU] via SUBCUTANEOUS
  Filled 2012-03-09: qty 1

## 2012-03-09 MED ORDER — EPOETIN ALFA 10000 UNIT/ML IJ SOLN
INTRAMUSCULAR | Status: AC
  Administered 2012-03-09: 10000 [IU] via SUBCUTANEOUS
  Filled 2012-03-09: qty 1

## 2012-03-15 ENCOUNTER — Encounter (HOSPITAL_COMMUNITY)
Admission: RE | Admit: 2012-03-15 | Discharge: 2012-03-15 | Disposition: A | Discharge status: Home / Self Care | Payer: Medicare Other | Source: Ambulatory Visit | Attending: Nephrology | Admitting: Nephrology

## 2012-03-15 LAB — POCT HEMOGLOBIN-HEMACUE: Hemoglobin: 10.4 g/dL — ABNORMAL LOW (ref 12.0–15.0)

## 2012-03-15 MED ORDER — EPOETIN ALFA 20000 UNIT/ML IJ SOLN: 30000.0000 [IU] | INTRAMUSCULAR | Status: DC

## 2012-03-15 MED ORDER — EPOETIN ALFA 10000 UNIT/ML IJ SOLN
INTRAMUSCULAR | Status: AC
  Administered 2012-03-15: 10000 [IU]
  Filled 2012-03-15: qty 1

## 2012-03-15 MED ORDER — EPOETIN ALFA 20000 UNIT/ML IJ SOLN
INTRAMUSCULAR | Status: AC
  Administered 2012-03-15: 20000 [IU]
  Filled 2012-03-15: qty 1

## 2012-03-26 ENCOUNTER — Encounter (HOSPITAL_COMMUNITY)
Admission: RE | Admit: 2012-03-26 | Discharge: 2012-03-26 | Disposition: A | Discharge status: Home / Self Care | Payer: Medicare Other | Source: Ambulatory Visit | Attending: Nephrology | Admitting: Nephrology

## 2012-03-26 LAB — POCT HEMOGLOBIN-HEMACUE: Hemoglobin: 10.5 g/dL — ABNORMAL LOW (ref 12.0–15.0)

## 2012-03-26 MED ORDER — EPOETIN ALFA 10000 UNIT/ML IJ SOLN
INTRAMUSCULAR | Status: AC
  Administered 2012-03-26: 10000 [IU] via SUBCUTANEOUS
  Filled 2012-03-26: qty 1

## 2012-03-26 MED ORDER — EPOETIN ALFA 20000 UNIT/ML IJ SOLN: 30000.0000 [IU] | INTRAMUSCULAR | Status: DC

## 2012-03-26 MED ORDER — EPOETIN ALFA 20000 UNIT/ML IJ SOLN
INTRAMUSCULAR | Status: AC
  Administered 2012-03-26: 20000 [IU] via SUBCUTANEOUS
  Filled 2012-03-26: qty 1

## 2012-04-09 ENCOUNTER — Encounter (HOSPITAL_COMMUNITY): Payer: Medicare Other

## 2012-04-13 ENCOUNTER — Encounter (HOSPITAL_COMMUNITY): Payer: Medicare Other

## 2012-04-18 ENCOUNTER — Encounter (HOSPITAL_COMMUNITY)
Admission: RE | Admit: 2012-04-18 | Discharge: 2012-04-18 | Disposition: A | Discharge status: Home / Self Care | Payer: Medicare Other | Source: Ambulatory Visit | Attending: Nephrology | Admitting: Nephrology

## 2012-04-18 DIAGNOSIS — Z940 Kidney transplant status: Secondary | ICD-10-CM | POA: Insufficient documentation

## 2012-04-18 DIAGNOSIS — D638 Anemia in other chronic diseases classified elsewhere: Secondary | ICD-10-CM | POA: Insufficient documentation

## 2012-04-18 DIAGNOSIS — N185 Chronic kidney disease, stage 5: Secondary | ICD-10-CM | POA: Insufficient documentation

## 2012-04-18 LAB — POCT HEMOGLOBIN-HEMACUE: Hemoglobin: 9.9 g/dL — ABNORMAL LOW (ref 12.0–15.0)

## 2012-04-18 LAB — IRON AND TIBC: Saturation Ratios: 71 % — ABNORMAL HIGH (ref 20–55)

## 2012-04-18 MED ORDER — EPOETIN ALFA 20000 UNIT/ML IJ SOLN
INTRAMUSCULAR | Status: AC
  Administered 2012-04-18: 20000 [IU]
  Filled 2012-04-18: qty 1

## 2012-04-18 MED ORDER — EPOETIN ALFA 10000 UNIT/ML IJ SOLN
INTRAMUSCULAR | Status: AC
  Administered 2012-04-18: 10000 [IU]
  Filled 2012-04-18: qty 1

## 2012-04-18 MED ORDER — EPOETIN ALFA 20000 UNIT/ML IJ SOLN: 30000.0000 [IU] | INTRAMUSCULAR | Status: DC

## 2012-04-25 ENCOUNTER — Encounter (HOSPITAL_COMMUNITY)
Admission: RE | Admit: 2012-04-25 | Discharge: 2012-04-25 | Disposition: A | Discharge status: Home / Self Care | Payer: Medicare Other | Source: Ambulatory Visit | Attending: Nephrology | Admitting: Nephrology

## 2012-04-25 LAB — POCT HEMOGLOBIN-HEMACUE: Hemoglobin: 9.9 g/dL — ABNORMAL LOW (ref 12.0–15.0)

## 2012-04-25 MED ORDER — EPOETIN ALFA 10000 UNIT/ML IJ SOLN
INTRAMUSCULAR | Status: AC
  Administered 2012-04-25: 10000 [IU] via SUBCUTANEOUS
  Filled 2012-04-25: qty 1

## 2012-04-25 MED ORDER — EPOETIN ALFA 20000 UNIT/ML IJ SOLN: 30000.0000 [IU] | INTRAMUSCULAR | Status: DC

## 2012-04-25 MED ORDER — EPOETIN ALFA 20000 UNIT/ML IJ SOLN
INTRAMUSCULAR | Status: AC
  Administered 2012-04-25: 20000 [IU] via SUBCUTANEOUS
  Filled 2012-04-25: qty 1

## 2012-05-03 ENCOUNTER — Encounter (HOSPITAL_COMMUNITY)
Admission: RE | Admit: 2012-05-03 | Discharge: 2012-05-03 | Disposition: A | Discharge status: Home / Self Care | Payer: Medicare Other | Source: Ambulatory Visit | Attending: Nephrology | Admitting: Nephrology

## 2012-05-03 DIAGNOSIS — Z940 Kidney transplant status: Secondary | ICD-10-CM | POA: Insufficient documentation

## 2012-05-03 DIAGNOSIS — N185 Chronic kidney disease, stage 5: Secondary | ICD-10-CM | POA: Insufficient documentation

## 2012-05-03 DIAGNOSIS — D638 Anemia in other chronic diseases classified elsewhere: Secondary | ICD-10-CM | POA: Insufficient documentation

## 2012-05-03 LAB — POCT HEMOGLOBIN-HEMACUE: Hemoglobin: 9.6 g/dL — ABNORMAL LOW (ref 12.0–15.0)

## 2012-05-03 MED ORDER — EPOETIN ALFA 20000 UNIT/ML IJ SOLN
INTRAMUSCULAR | Status: AC
  Administered 2012-05-03: 20000 [IU] via SUBCUTANEOUS
  Filled 2012-05-03: qty 1

## 2012-05-03 MED ORDER — EPOETIN ALFA 10000 UNIT/ML IJ SOLN
INTRAMUSCULAR | Status: AC
  Administered 2012-05-03: 10000 [IU] via SUBCUTANEOUS
  Filled 2012-05-03: qty 1

## 2012-05-03 MED ORDER — EPOETIN ALFA 20000 UNIT/ML IJ SOLN: 30000.0000 [IU] | INTRAMUSCULAR | Status: DC

## 2012-05-09 ENCOUNTER — Other Ambulatory Visit (HOSPITAL_COMMUNITY): Payer: Self-pay | Admitting: *Deleted

## 2012-05-10 ENCOUNTER — Encounter (HOSPITAL_COMMUNITY)
Admission: RE | Admit: 2012-05-10 | Discharge: 2012-05-10 | Disposition: A | Discharge status: Home / Self Care | Payer: Medicare Other | Source: Ambulatory Visit | Attending: Nephrology | Admitting: Nephrology

## 2012-05-10 MED ORDER — EPOETIN ALFA 10000 UNIT/ML IJ SOLN
INTRAMUSCULAR | Status: AC
  Administered 2012-05-10: 10000 [IU]
  Filled 2012-05-10: qty 1

## 2012-05-10 MED ORDER — EPOETIN ALFA 20000 UNIT/ML IJ SOLN
30000.0000 [IU] | INTRAMUSCULAR | Status: DC
  Administered 2012-05-10: 20000 [IU] via SUBCUTANEOUS

## 2012-05-10 MED ORDER — EPOETIN ALFA 20000 UNIT/ML IJ SOLN
INTRAMUSCULAR | Status: AC
  Filled 2012-05-10: qty 1

## 2012-05-17 ENCOUNTER — Other Ambulatory Visit (HOSPITAL_COMMUNITY): Payer: Self-pay | Admitting: *Deleted

## 2012-05-18 ENCOUNTER — Encounter (HOSPITAL_COMMUNITY)
Admission: RE | Admit: 2012-05-18 | Discharge: 2012-05-18 | Disposition: A | Discharge status: Home / Self Care | Payer: Medicare Other | Source: Ambulatory Visit | Attending: Nephrology | Admitting: Nephrology

## 2012-05-18 LAB — POCT HEMOGLOBIN-HEMACUE: Hemoglobin: 9.2 g/dL — ABNORMAL LOW (ref 12.0–15.0)

## 2012-05-18 MED ORDER — EPOETIN ALFA 20000 UNIT/ML IJ SOLN: 40000.0000 [IU] | INTRAMUSCULAR | Status: DC

## 2012-05-18 MED ORDER — EPOETIN ALFA 40000 UNIT/ML IJ SOLN
INTRAMUSCULAR | Status: AC
  Administered 2012-05-18: 40000 [IU] via SUBCUTANEOUS
  Filled 2012-05-18: qty 1

## 2012-05-19 LAB — IRON AND TIBC: Saturation Ratios: 48 % (ref 20–55)

## 2012-05-25 ENCOUNTER — Encounter (HOSPITAL_COMMUNITY): Payer: Medicare Other

## 2012-05-31 ENCOUNTER — Encounter (HOSPITAL_COMMUNITY)
Admission: RE | Admit: 2012-05-31 | Discharge: 2012-05-31 | Disposition: A | Discharge status: Home / Self Care | Payer: Medicare Other | Source: Ambulatory Visit | Attending: Nephrology | Admitting: Nephrology

## 2012-05-31 ENCOUNTER — Encounter (HOSPITAL_COMMUNITY): Payer: Medicare Other

## 2012-05-31 DIAGNOSIS — N185 Chronic kidney disease, stage 5: Secondary | ICD-10-CM | POA: Insufficient documentation

## 2012-05-31 DIAGNOSIS — D638 Anemia in other chronic diseases classified elsewhere: Secondary | ICD-10-CM | POA: Insufficient documentation

## 2012-05-31 DIAGNOSIS — Z940 Kidney transplant status: Secondary | ICD-10-CM | POA: Insufficient documentation

## 2012-05-31 LAB — POCT HEMOGLOBIN-HEMACUE: Hemoglobin: 8.2 g/dL — ABNORMAL LOW (ref 12.0–15.0)

## 2012-05-31 MED ORDER — EPOETIN ALFA 20000 UNIT/ML IJ SOLN: 40000.0000 [IU] | INTRAMUSCULAR | Status: DC

## 2012-05-31 MED ORDER — EPOETIN ALFA 40000 UNIT/ML IJ SOLN
INTRAMUSCULAR | Status: AC
  Administered 2012-05-31: 40000 [IU] via SUBCUTANEOUS
  Filled 2012-05-31: qty 1

## 2012-06-06 ENCOUNTER — Encounter (HOSPITAL_COMMUNITY)
Admission: RE | Admit: 2012-06-06 | Discharge: 2012-06-06 | Disposition: A | Discharge status: Home / Self Care | Payer: Medicare Other | Source: Ambulatory Visit | Attending: Nephrology | Admitting: Nephrology

## 2012-06-06 LAB — POCT HEMOGLOBIN-HEMACUE: Hemoglobin: 8.5 g/dL — ABNORMAL LOW (ref 12.0–15.0)

## 2012-06-06 MED ORDER — EPOETIN ALFA 20000 UNIT/ML IJ SOLN: 40000.0000 [IU] | INTRAMUSCULAR | Status: DC

## 2012-06-06 MED ORDER — EPOETIN ALFA 40000 UNIT/ML IJ SOLN
INTRAMUSCULAR | Status: AC
  Administered 2012-06-06: 40000 [IU] via SUBCUTANEOUS
  Filled 2012-06-06: qty 1

## 2012-06-13 ENCOUNTER — Encounter (HOSPITAL_COMMUNITY)
Admission: RE | Admit: 2012-06-13 | Discharge: 2012-06-13 | Disposition: A | Discharge status: Home / Self Care | Payer: Medicare Other | Source: Ambulatory Visit | Attending: Nephrology | Admitting: Nephrology

## 2012-06-13 LAB — HEMOGLOBIN AND HEMATOCRIT, BLOOD: HCT: 26.5 % — ABNORMAL LOW (ref 36.0–46.0)

## 2012-06-13 LAB — IRON AND TIBC: Iron: 135 ug/dL (ref 42–135)

## 2012-06-13 LAB — POCT HEMOGLOBIN-HEMACUE: Hemoglobin: 7.9 g/dL — ABNORMAL LOW (ref 12.0–15.0)

## 2012-06-13 MED ORDER — EPOETIN ALFA 40000 UNIT/ML IJ SOLN
INTRAMUSCULAR | Status: AC
  Administered 2012-06-13: 40000 [IU] via SUBCUTANEOUS
  Filled 2012-06-13: qty 1

## 2012-06-13 MED ORDER — EPOETIN ALFA 20000 UNIT/ML IJ SOLN: 40000.0000 [IU] | INTRAMUSCULAR | Status: DC

## 2012-06-13 NOTE — Progress Notes (Signed)
Hemocue 7.9. Repeat hemocue 8.1.  Dr. Ena Dawley office notified

## 2012-06-20 ENCOUNTER — Encounter (HOSPITAL_COMMUNITY)
Admission: RE | Admit: 2012-06-20 | Discharge: 2012-06-20 | Disposition: A | Discharge status: Home / Self Care | Payer: Medicare Other | Source: Ambulatory Visit | Attending: Nephrology | Admitting: Nephrology

## 2012-06-20 LAB — RENAL FUNCTION PANEL
BUN: 39 mg/dL — ABNORMAL HIGH (ref 6–23)
CO2: 13 mEq/L — ABNORMAL LOW (ref 19–32)
Calcium: 7.8 mg/dL — ABNORMAL LOW (ref 8.4–10.5)
Creatinine, Ser: 3.74 mg/dL — ABNORMAL HIGH (ref 0.50–1.10)
GFR calc Af Amer: 15 mL/min — ABNORMAL LOW (ref >90)
Glucose, Bld: 86 mg/dL (ref 70–99)

## 2012-06-20 LAB — PROTEIN / CREATININE RATIO, URINE
Creatinine, Urine: 132.29 mg/dL
Total Protein, Urine: 85.3 mg/dL

## 2012-06-20 LAB — CBC
Hemoglobin: 8.1 g/dL — ABNORMAL LOW (ref 12.0–15.0)
MCHC: 30.6 g/dL (ref 30.0–36.0)
RBC: 3.29 MIL/uL — ABNORMAL LOW (ref 3.87–5.11)
WBC: 6.1 10*3/uL (ref 4.0–10.5)

## 2012-06-20 MED ORDER — EPOETIN ALFA 40000 UNIT/ML IJ SOLN
INTRAMUSCULAR | Status: AC
  Administered 2012-06-20: 40000 [IU] via SUBCUTANEOUS
  Filled 2012-06-20: qty 1

## 2012-06-20 MED ORDER — EPOETIN ALFA 20000 UNIT/ML IJ SOLN: 40000.0000 [IU] | INTRAMUSCULAR | Status: DC

## 2012-06-21 LAB — TACROLIMUS LEVEL: Tacrolimus (FK506) - LabCorp: 5.4 ng/mL

## 2012-06-21 LAB — PTH, INTACT AND CALCIUM
Calcium, Total (PTH): 7.6 mg/dL — ABNORMAL LOW (ref 8.4–10.5)
PTH: 1211.1 pg/mL — ABNORMAL HIGH (ref 14.0–72.0)

## 2012-06-28 ENCOUNTER — Encounter (HOSPITAL_COMMUNITY)
Admission: RE | Admit: 2012-06-28 | Discharge: 2012-06-28 | Disposition: A | Discharge status: Home / Self Care | Payer: Medicare Other | Source: Ambulatory Visit | Attending: Nephrology | Admitting: Nephrology

## 2012-06-28 DIAGNOSIS — Z940 Kidney transplant status: Secondary | ICD-10-CM | POA: Insufficient documentation

## 2012-06-28 DIAGNOSIS — D638 Anemia in other chronic diseases classified elsewhere: Secondary | ICD-10-CM | POA: Insufficient documentation

## 2012-06-28 DIAGNOSIS — N185 Chronic kidney disease, stage 5: Secondary | ICD-10-CM | POA: Insufficient documentation

## 2012-06-28 MED ORDER — EPOETIN ALFA 40000 UNIT/ML IJ SOLN
INTRAMUSCULAR | Status: AC
  Administered 2012-06-28: 40000 [IU]
  Filled 2012-06-28: qty 1

## 2012-06-28 MED ORDER — EPOETIN ALFA 20000 UNIT/ML IJ SOLN: 40000.0000 [IU] | INTRAMUSCULAR | Status: DC

## 2012-07-03 LAB — CYTOMEGALOVIRUS PCR, QUALITATIVE: Cytomegalovirus DNA: NOT DETECTED

## 2012-07-05 ENCOUNTER — Encounter (HOSPITAL_COMMUNITY)
Admission: RE | Admit: 2012-07-05 | Discharge: 2012-07-05 | Disposition: A | Discharge status: Home / Self Care | Payer: Medicare Other | Source: Ambulatory Visit | Attending: Nephrology | Admitting: Nephrology

## 2012-07-05 LAB — FERRITIN: Ferritin: 265 ng/mL (ref 10–291)

## 2012-07-05 LAB — IRON AND TIBC
Saturation Ratios: 57 % — ABNORMAL HIGH (ref 20–55)
UIBC: 90 ug/dL — ABNORMAL LOW (ref 125–400)

## 2012-07-05 LAB — RENAL FUNCTION PANEL
Albumin: 3.8 g/dL (ref 3.5–5.2)
BUN: 31 mg/dL — ABNORMAL HIGH (ref 6–23)
Chloride: 112 mEq/L (ref 96–112)
GFR calc non Af Amer: 13 mL/min — ABNORMAL LOW (ref >90)
Phosphorus: 2.2 mg/dL — ABNORMAL LOW (ref 2.3–4.6)
Potassium: 3.7 mEq/L (ref 3.5–5.1)
Sodium: 141 mEq/L (ref 135–145)

## 2012-07-05 MED ORDER — EPOETIN ALFA 40000 UNIT/ML IJ SOLN
INTRAMUSCULAR | Status: AC
  Filled 2012-07-05: qty 1

## 2012-07-05 MED ORDER — EPOETIN ALFA 20000 UNIT/ML IJ SOLN
40000.0000 [IU] | INTRAMUSCULAR | Status: DC
  Administered 2012-07-05: 40000 [IU] via SUBCUTANEOUS

## 2012-07-06 LAB — PTH, INTACT AND CALCIUM
Calcium, Total (PTH): 8.5 mg/dL (ref 8.4–10.5)
PTH: 1360.6 pg/mL — ABNORMAL HIGH (ref 14.0–72.0)

## 2012-07-06 MED FILL — Epoetin Alfa Inj 40000 Unit/ML: INTRAMUSCULAR | Qty: 1 | Status: AC

## 2012-07-11 ENCOUNTER — Encounter (HOSPITAL_COMMUNITY)
Admission: RE | Admit: 2012-07-11 | Discharge: 2012-07-11 | Disposition: A | Discharge status: Home / Self Care | Payer: Medicare Other | Source: Ambulatory Visit | Attending: Nephrology | Admitting: Nephrology

## 2012-07-11 MED ORDER — EPOETIN ALFA 40000 UNIT/ML IJ SOLN
INTRAMUSCULAR | Status: AC
  Administered 2012-07-11: 40000 [IU] via SUBCUTANEOUS
  Filled 2012-07-11: qty 1

## 2012-07-11 MED ORDER — EPOETIN ALFA 20000 UNIT/ML IJ SOLN: 40000.0000 [IU] | INTRAMUSCULAR | Status: DC

## 2012-07-17 ENCOUNTER — Encounter (HOSPITAL_COMMUNITY)
Admission: RE | Admit: 2012-07-17 | Discharge: 2012-07-17 | Disposition: A | Discharge status: Home / Self Care | Payer: Medicare Other | Source: Ambulatory Visit | Attending: Nephrology | Admitting: Nephrology

## 2012-07-17 LAB — FERRITIN: Ferritin: 232 ng/mL (ref 10–291)

## 2012-07-17 LAB — POCT HEMOGLOBIN-HEMACUE: Hemoglobin: 9.1 g/dL — ABNORMAL LOW (ref 12.0–15.0)

## 2012-07-17 MED ORDER — EPOETIN ALFA 40000 UNIT/ML IJ SOLN
INTRAMUSCULAR | Status: AC
  Filled 2012-07-17: qty 1

## 2012-07-17 MED ORDER — EPOETIN ALFA 20000 UNIT/ML IJ SOLN
40000.0000 [IU] | INTRAMUSCULAR | Status: DC
  Administered 2012-07-17: 40000 [IU] via SUBCUTANEOUS

## 2012-07-18 ENCOUNTER — Encounter (HOSPITAL_COMMUNITY): Payer: Medicare Other

## 2012-07-18 MED FILL — Epoetin Alfa Inj 40000 Unit/ML: INTRAMUSCULAR | Qty: 1 | Status: AC

## 2012-07-26 ENCOUNTER — Encounter (HOSPITAL_COMMUNITY)
Admission: RE | Admit: 2012-07-26 | Discharge: 2012-07-26 | Disposition: A | Discharge status: Home / Self Care | Payer: Medicare Other | Source: Ambulatory Visit | Attending: Nephrology | Admitting: Nephrology

## 2012-07-26 LAB — POCT HEMOGLOBIN-HEMACUE: Hemoglobin: 7.6 g/dL — ABNORMAL LOW (ref 12.0–15.0)

## 2012-07-26 MED ORDER — EPOETIN ALFA 40000 UNIT/ML IJ SOLN
INTRAMUSCULAR | Status: AC
  Administered 2012-07-26: 40000 [IU] via SUBCUTANEOUS
  Filled 2012-07-26: qty 1

## 2012-07-26 MED ORDER — EPOETIN ALFA 20000 UNIT/ML IJ SOLN: 40000.0000 [IU] | INTRAMUSCULAR | Status: DC

## 2012-07-26 NOTE — Progress Notes (Signed)
Crystal called back from Dr. Ena Dawley office.  No new orders.  Dr. Posey Pronto wants to keep everything the same.

## 2012-07-26 NOTE — Progress Notes (Signed)
Pts hemocue  7.6.  Office notified.  Spoke with Crystal at office who stated she would notify  Dr.Patel.   Pt states she feels good and is asymptomatic.

## 2012-08-02 ENCOUNTER — Encounter (HOSPITAL_COMMUNITY)
Admission: RE | Admit: 2012-08-02 | Discharge: 2012-08-02 | Disposition: A | Discharge status: Home / Self Care | Payer: Medicare Other | Source: Ambulatory Visit | Attending: Nephrology | Admitting: Nephrology

## 2012-08-02 DIAGNOSIS — D638 Anemia in other chronic diseases classified elsewhere: Secondary | ICD-10-CM | POA: Insufficient documentation

## 2012-08-02 DIAGNOSIS — N185 Chronic kidney disease, stage 5: Secondary | ICD-10-CM | POA: Insufficient documentation

## 2012-08-02 DIAGNOSIS — Z940 Kidney transplant status: Secondary | ICD-10-CM | POA: Insufficient documentation

## 2012-08-02 LAB — POCT HEMOGLOBIN-HEMACUE: Hemoglobin: 7.9 g/dL — ABNORMAL LOW (ref 12.0–15.0)

## 2012-08-02 MED ORDER — EPOETIN ALFA 40000 UNIT/ML IJ SOLN
INTRAMUSCULAR | Status: AC
  Administered 2012-08-02: 40000 [IU] via SUBCUTANEOUS
  Filled 2012-08-02: qty 1

## 2012-08-02 MED ORDER — EPOETIN ALFA 20000 UNIT/ML IJ SOLN: 40000.0000 [IU] | INTRAMUSCULAR | Status: DC

## 2012-08-09 ENCOUNTER — Other Ambulatory Visit (HOSPITAL_COMMUNITY): Payer: Self-pay | Admitting: *Deleted

## 2012-08-10 ENCOUNTER — Encounter (HOSPITAL_COMMUNITY)
Admission: RE | Admit: 2012-08-10 | Discharge: 2012-08-10 | Disposition: A | Discharge status: Home / Self Care | Payer: Medicare Other | Source: Ambulatory Visit | Attending: Nephrology | Admitting: Nephrology

## 2012-08-10 LAB — POCT HEMOGLOBIN-HEMACUE: Hemoglobin: 7.9 g/dL — ABNORMAL LOW (ref 12.0–15.0)

## 2012-08-10 MED ORDER — EPOETIN ALFA 40000 UNIT/ML IJ SOLN
INTRAMUSCULAR | Status: AC
  Administered 2012-08-10: 40000 [IU] via SUBCUTANEOUS
  Filled 2012-08-10: qty 1

## 2012-08-10 MED ORDER — EPOETIN ALFA 20000 UNIT/ML IJ SOLN: 40000.0000 [IU] | INTRAMUSCULAR | Status: DC

## 2012-08-17 ENCOUNTER — Encounter (HOSPITAL_COMMUNITY)
Admission: RE | Admit: 2012-08-17 | Discharge: 2012-08-17 | Disposition: A | Discharge status: Home / Self Care | Payer: Medicare Other | Source: Ambulatory Visit | Attending: Nephrology | Admitting: Nephrology

## 2012-08-17 LAB — IRON AND TIBC: UIBC: 153 ug/dL (ref 125–400)

## 2012-08-17 LAB — POCT HEMOGLOBIN-HEMACUE: Hemoglobin: 8.1 g/dL — ABNORMAL LOW (ref 12.0–15.0)

## 2012-08-17 MED ORDER — EPOETIN ALFA 20000 UNIT/ML IJ SOLN: 40000.0000 [IU] | INTRAMUSCULAR | Status: DC

## 2012-08-17 MED ORDER — EPOETIN ALFA 40000 UNIT/ML IJ SOLN
INTRAMUSCULAR | Status: AC
  Administered 2012-08-17: 40000 [IU] via SUBCUTANEOUS
  Filled 2012-08-17: qty 1

## 2012-08-27 ENCOUNTER — Encounter (HOSPITAL_COMMUNITY)
Admission: RE | Admit: 2012-08-27 | Discharge: 2012-08-27 | Disposition: A | Discharge status: Home / Self Care | Payer: Medicare Other | Source: Ambulatory Visit | Attending: Nephrology | Admitting: Nephrology

## 2012-08-27 LAB — POCT HEMOGLOBIN-HEMACUE: Hemoglobin: 8.5 g/dL — ABNORMAL LOW (ref 12.0–15.0)

## 2012-08-27 MED ORDER — EPOETIN ALFA 20000 UNIT/ML IJ SOLN
40000.0000 [IU] | INTRAMUSCULAR | Status: DC
  Administered 2012-08-27: 40000 [IU] via SUBCUTANEOUS

## 2012-08-27 MED ORDER — EPOETIN ALFA 40000 UNIT/ML IJ SOLN
INTRAMUSCULAR | Status: AC
  Filled 2012-08-27: qty 1

## 2012-08-28 MED FILL — Epoetin Alfa Inj 40000 Unit/ML: INTRAMUSCULAR | Qty: 1 | Status: AC

## 2012-09-05 ENCOUNTER — Encounter (HOSPITAL_COMMUNITY): Payer: Medicare Other

## 2012-09-11 ENCOUNTER — Encounter (HOSPITAL_COMMUNITY): Payer: Medicare Other

## 2012-09-13 ENCOUNTER — Other Ambulatory Visit (HOSPITAL_COMMUNITY): Payer: Self-pay

## 2012-09-14 ENCOUNTER — Encounter (HOSPITAL_COMMUNITY)
Admission: RE | Admit: 2012-09-14 | Discharge: 2012-09-14 | Disposition: A | Discharge status: Home / Self Care | Payer: Medicare Other | Source: Ambulatory Visit | Attending: Nephrology | Admitting: Nephrology

## 2012-09-14 DIAGNOSIS — D638 Anemia in other chronic diseases classified elsewhere: Secondary | ICD-10-CM | POA: Insufficient documentation

## 2012-09-14 DIAGNOSIS — Z940 Kidney transplant status: Secondary | ICD-10-CM | POA: Insufficient documentation

## 2012-09-14 DIAGNOSIS — N185 Chronic kidney disease, stage 5: Secondary | ICD-10-CM | POA: Insufficient documentation

## 2012-09-14 LAB — RENAL FUNCTION PANEL
Albumin: 3.7 g/dL (ref 3.5–5.2)
BUN: 38 mg/dL — ABNORMAL HIGH (ref 6–23)
Calcium: 8.5 mg/dL (ref 8.4–10.5)
Glucose, Bld: 91 mg/dL (ref 70–99)
Phosphorus: 3 mg/dL (ref 2.3–4.6)
Potassium: 4 mEq/L (ref 3.5–5.1)
Sodium: 144 mEq/L (ref 135–145)

## 2012-09-14 LAB — MAGNESIUM: Magnesium: 1.4 mg/dL — ABNORMAL LOW (ref 1.5–2.5)

## 2012-09-14 LAB — IRON AND TIBC
Iron: 116 ug/dL (ref 42–135)
TIBC: 218 ug/dL — ABNORMAL LOW (ref 250–470)
UIBC: 102 ug/dL — ABNORMAL LOW (ref 125–400)

## 2012-09-14 LAB — FERRITIN: Ferritin: 258 ng/mL (ref 10–291)

## 2012-09-14 LAB — URINALYSIS W MICROSCOPIC + REFLEX CULTURE
Bilirubin Urine: UNDETERMINED — AB
Hgb urine dipstick: UNDETERMINED — AB
Ketones, ur: UNDETERMINED mg/dL — AB
Specific Gravity, Urine: UNDETERMINED (ref 1.005–1.030)
pH: UNDETERMINED (ref 5.0–8.0)

## 2012-09-14 MED ORDER — EPOETIN ALFA 20000 UNIT/ML IJ SOLN: 40000.0000 [IU] | INTRAMUSCULAR | Status: DC

## 2012-09-14 MED ORDER — EPOETIN ALFA 40000 UNIT/ML IJ SOLN
INTRAMUSCULAR | Status: AC
  Administered 2012-09-14: 40000 [IU] via SUBCUTANEOUS
  Filled 2012-09-14: qty 1

## 2012-09-17 LAB — PTH, INTACT AND CALCIUM: PTH: 1643.2 pg/mL — ABNORMAL HIGH (ref 14.0–72.0)

## 2012-09-24 ENCOUNTER — Encounter (HOSPITAL_COMMUNITY)
Admission: RE | Admit: 2012-09-24 | Discharge: 2012-09-24 | Disposition: A | Discharge status: Home / Self Care | Payer: Medicare Other | Source: Ambulatory Visit | Attending: Nephrology | Admitting: Nephrology

## 2012-09-24 LAB — POCT HEMOGLOBIN-HEMACUE
Hemoglobin: 7.2 g/dL — ABNORMAL LOW (ref 12.0–15.0)
Hemoglobin: 8.3 g/dL — ABNORMAL LOW (ref 12.0–15.0)

## 2012-09-24 MED ORDER — EPOETIN ALFA 20000 UNIT/ML IJ SOLN
40000.0000 [IU] | INTRAMUSCULAR | Status: DC
  Administered 2012-09-24: 40000 [IU] via SUBCUTANEOUS

## 2012-09-24 MED ORDER — EPOETIN ALFA 40000 UNIT/ML IJ SOLN
INTRAMUSCULAR | Status: AC
  Filled 2012-09-24: qty 1

## 2012-09-25 LAB — TACROLIMUS LEVEL: Tacrolimus (FK506) - LabCorp: 4.2 ng/mL

## 2012-10-03 ENCOUNTER — Encounter (HOSPITAL_COMMUNITY)
Admission: RE | Admit: 2012-10-03 | Discharge: 2012-10-03 | Disposition: A | Discharge status: Home / Self Care | Payer: Medicare Other | Source: Ambulatory Visit | Attending: Nephrology | Admitting: Nephrology

## 2012-10-03 DIAGNOSIS — Z940 Kidney transplant status: Secondary | ICD-10-CM | POA: Insufficient documentation

## 2012-10-03 DIAGNOSIS — N185 Chronic kidney disease, stage 5: Secondary | ICD-10-CM | POA: Insufficient documentation

## 2012-10-03 DIAGNOSIS — D638 Anemia in other chronic diseases classified elsewhere: Secondary | ICD-10-CM | POA: Insufficient documentation

## 2012-10-03 LAB — URINALYSIS, ROUTINE W REFLEX MICROSCOPIC
Bilirubin Urine: NEGATIVE
Glucose, UA: NEGATIVE mg/dL
Ketones, ur: NEGATIVE mg/dL
Protein, ur: 30 mg/dL — AB

## 2012-10-03 MED ORDER — EPOETIN ALFA 20000 UNIT/ML IJ SOLN: 40000.0000 [IU] | INTRAMUSCULAR | Status: DC

## 2012-10-03 MED ORDER — EPOETIN ALFA 40000 UNIT/ML IJ SOLN
INTRAMUSCULAR | Status: AC
  Administered 2012-10-03: 40000 [IU] via SUBCUTANEOUS
  Filled 2012-10-03: qty 1

## 2012-10-11 ENCOUNTER — Encounter (HOSPITAL_COMMUNITY)
Admission: RE | Admit: 2012-10-11 | Discharge: 2012-10-11 | Disposition: A | Discharge status: Home / Self Care | Payer: Medicare Other | Source: Ambulatory Visit | Attending: Nephrology | Admitting: Nephrology

## 2012-10-11 LAB — RENAL FUNCTION PANEL
Albumin: 3.9 g/dL (ref 3.5–5.2)
BUN: 41 mg/dL — ABNORMAL HIGH (ref 6–23)
Calcium: 8.7 mg/dL (ref 8.4–10.5)
Chloride: 113 mEq/L — ABNORMAL HIGH (ref 96–112)
Creatinine, Ser: 3.54 mg/dL — ABNORMAL HIGH (ref 0.50–1.10)
GFR calc non Af Amer: 14 mL/min — ABNORMAL LOW (ref >90)
Glucose, Bld: 102 mg/dL — ABNORMAL HIGH (ref 70–99)
Potassium: 3.5 mEq/L (ref 3.5–5.1)

## 2012-10-11 LAB — IRON AND TIBC
Iron: 141 ug/dL — ABNORMAL HIGH (ref 42–135)
TIBC: 234 ug/dL — ABNORMAL LOW (ref 250–470)
UIBC: 93 ug/dL — ABNORMAL LOW (ref 125–400)

## 2012-10-11 LAB — POCT HEMOGLOBIN-HEMACUE: Hemoglobin: 9.1 g/dL — ABNORMAL LOW (ref 12.0–15.0)

## 2012-10-11 LAB — FERRITIN: Ferritin: 284 ng/mL (ref 10–291)

## 2012-10-11 MED ORDER — EPOETIN ALFA 20000 UNIT/ML IJ SOLN: 40000.0000 [IU] | INTRAMUSCULAR | Status: DC

## 2012-10-11 MED ORDER — EPOETIN ALFA 40000 UNIT/ML IJ SOLN
INTRAMUSCULAR | Status: AC
  Administered 2012-10-11: 40000 [IU] via SUBCUTANEOUS
  Filled 2012-10-11: qty 1

## 2012-10-17 ENCOUNTER — Encounter (HOSPITAL_COMMUNITY): Payer: Medicare Other

## 2012-10-22 ENCOUNTER — Encounter (HOSPITAL_COMMUNITY)
Admission: RE | Admit: 2012-10-22 | Discharge: 2012-10-22 | Disposition: A | Discharge status: Home / Self Care | Payer: Medicare Other | Source: Ambulatory Visit | Attending: Nephrology | Admitting: Nephrology

## 2012-10-22 LAB — POCT HEMOGLOBIN-HEMACUE: Hemoglobin: 9.2 g/dL — ABNORMAL LOW (ref 12.0–15.0)

## 2012-10-22 MED ORDER — EPOETIN ALFA 40000 UNIT/ML IJ SOLN
INTRAMUSCULAR | Status: AC
  Administered 2012-10-22: 40000 [IU] via SUBCUTANEOUS
  Filled 2012-10-22: qty 1

## 2012-10-22 MED ORDER — EPOETIN ALFA 20000 UNIT/ML IJ SOLN: 40000.0000 [IU] | INTRAMUSCULAR | Status: DC

## 2012-11-06 ENCOUNTER — Other Ambulatory Visit (HOSPITAL_COMMUNITY): Payer: Self-pay | Admitting: *Deleted

## 2012-11-07 ENCOUNTER — Encounter (HOSPITAL_COMMUNITY): Payer: Medicare Other

## 2012-12-07 IMAGING — CR DG BONE SURVEY MET
10 series · 10 of 10 positions shown · non-contrast
Comparison: None.

CLINICAL DATA: History multiple myeloma.  Anemia.

METASTATIC BONE SURVEY

[t shoulder ap external left]
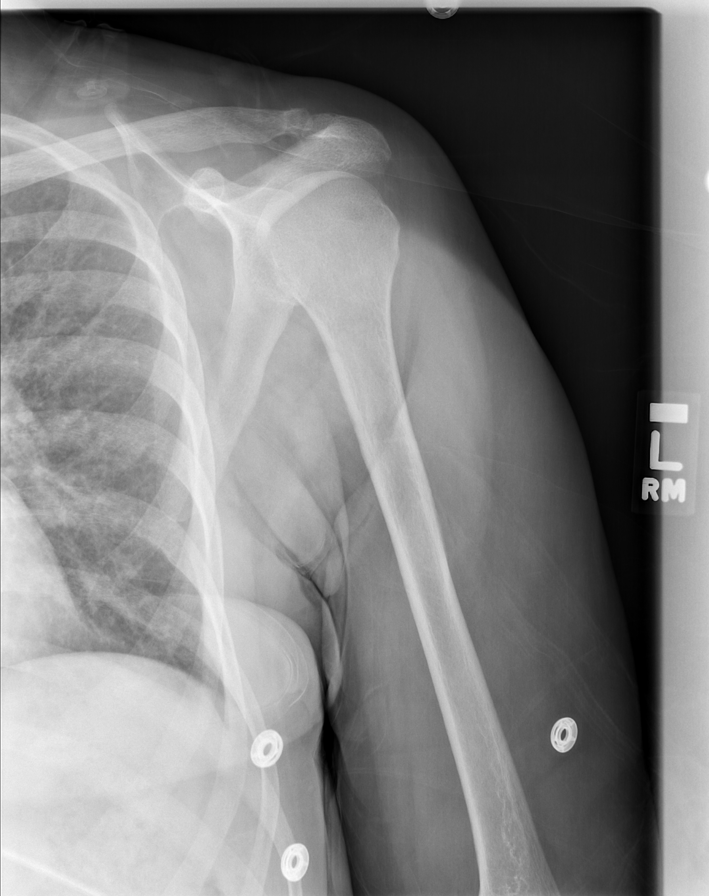

[t humerus ap left *]
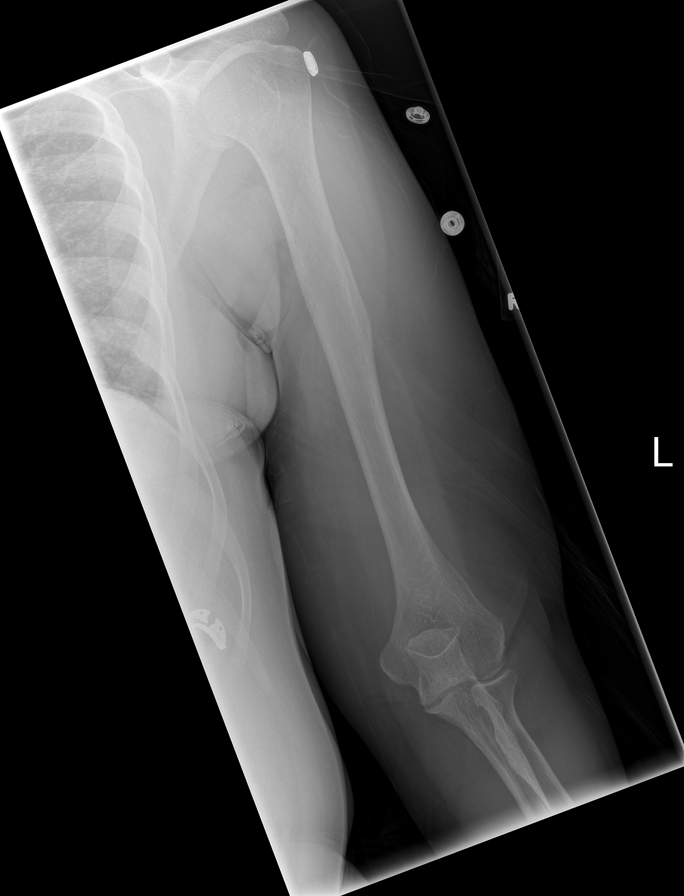

[t shoulder ap external righ]
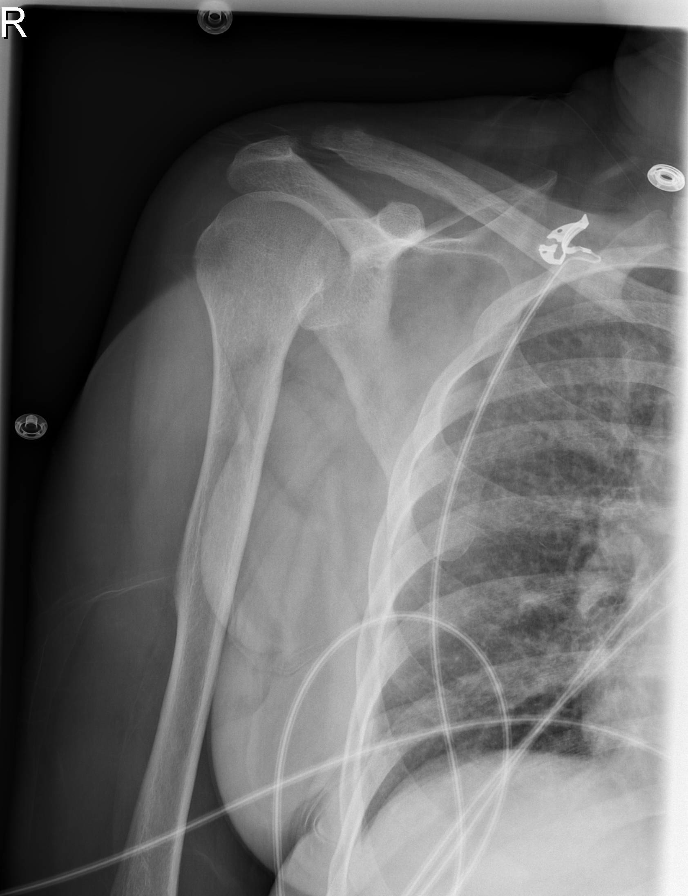

[t humerus ap right]
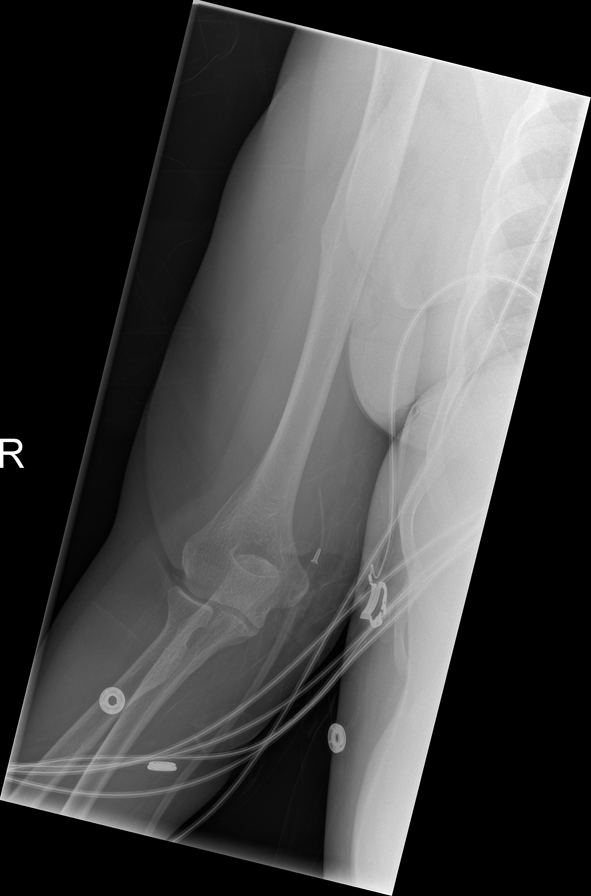

[t t-spine lat]
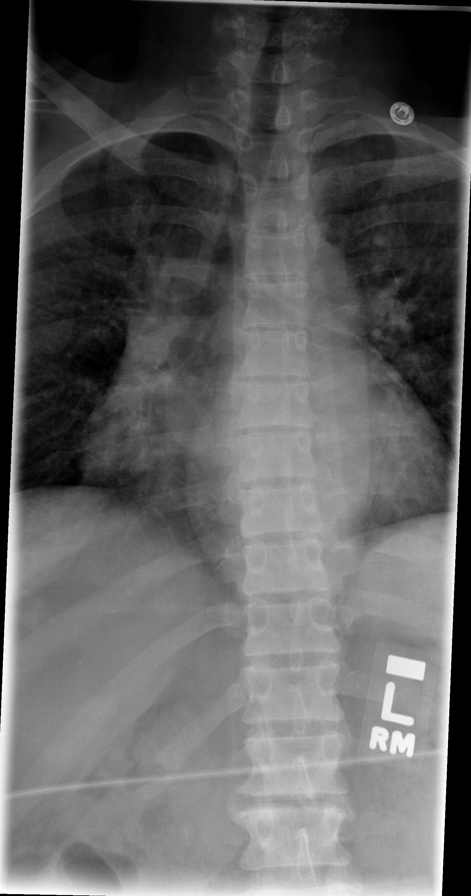

[t l-spine lat]
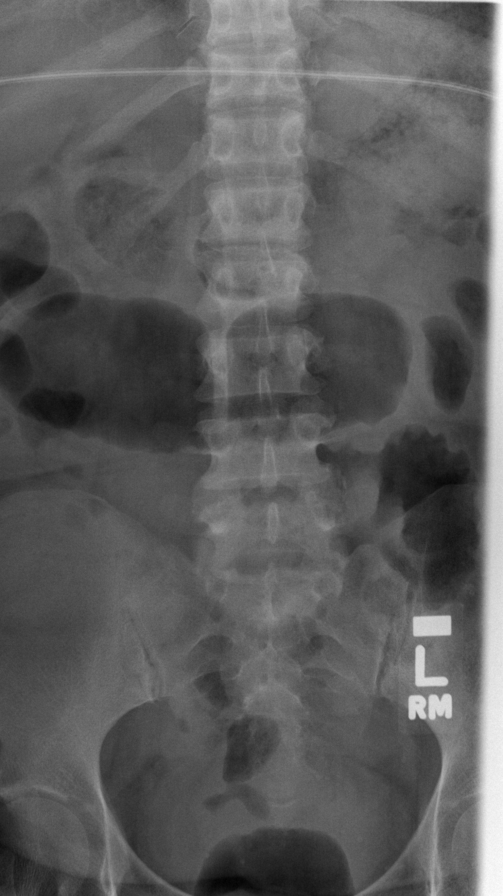

[t c-spine a.p.]
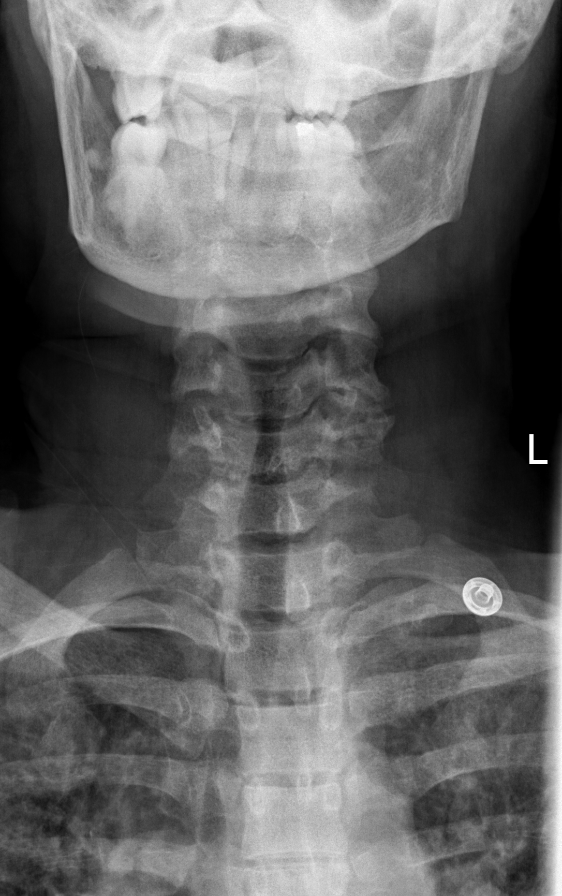

[t pelvis a.p.]
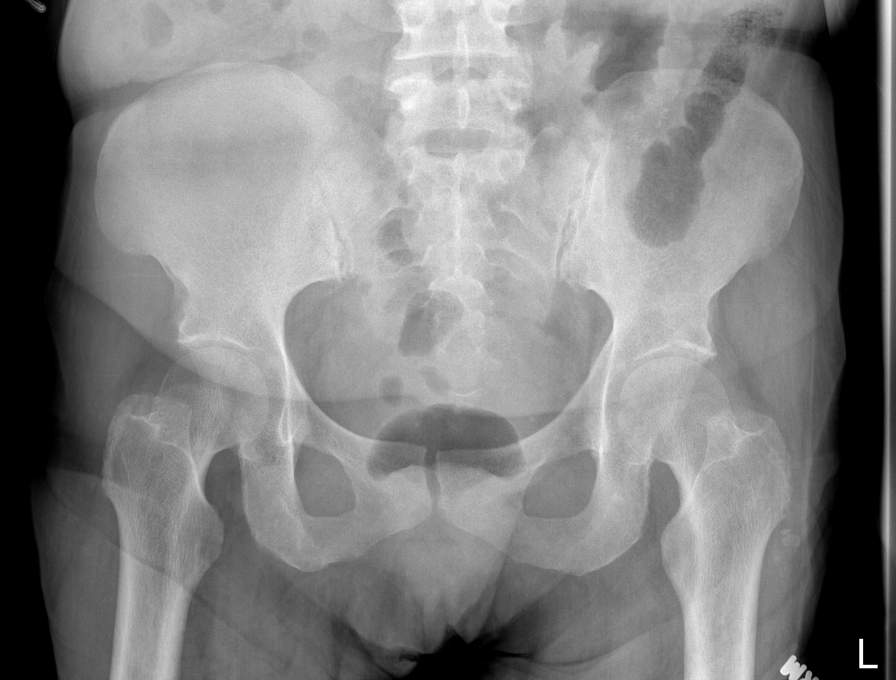

[t femur with hip  ap right]
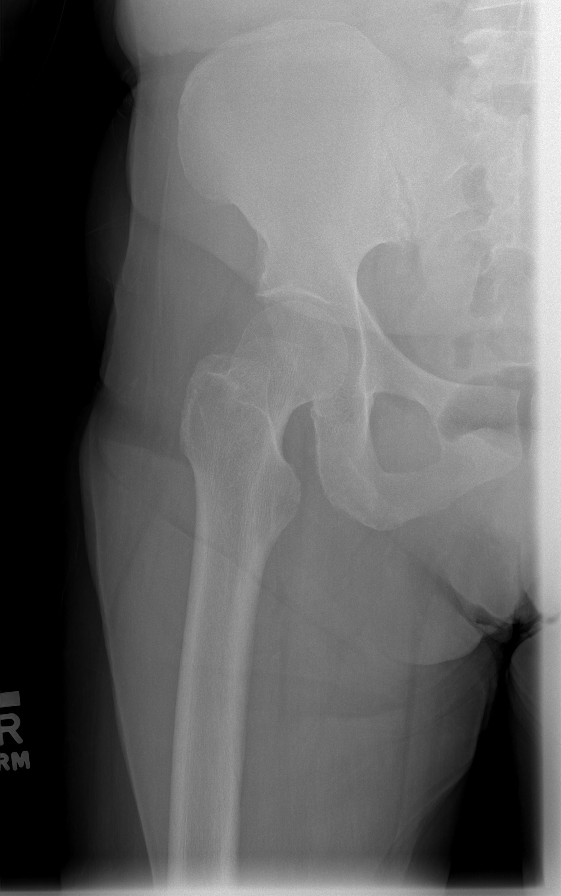

[t femur with hip  ap left]
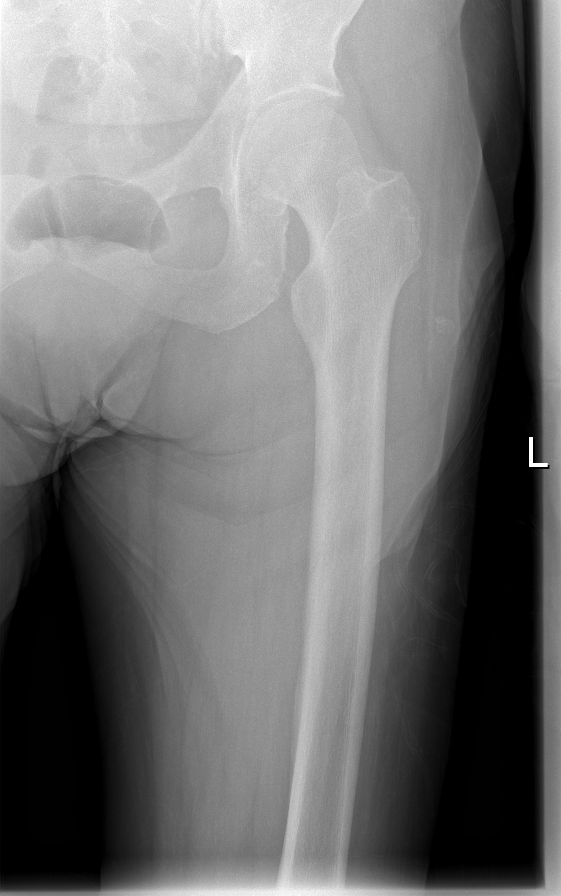

[10 of 10 positions shown; findings below may reference images not displayed]

FINDINGS: No lytic or sclerotic lesion is identified in the axial
or appendicular skeleton.  The patient has some cervical
degenerative disease with loss of disc space height and endplate
spurring worst at C6-7.  Degenerative disc disease is also noted at
L1-2 where there is loss of disc space height and endplate
spurring.
IMPRESSION: 1.  Negative for lytic or sclerotic lesion.
2.  Degenerative disc disease C6-7 and L1-2.

## 2013-01-31 ENCOUNTER — Other Ambulatory Visit: Payer: Self-pay | Admitting: Surgery

## 2013-01-31 ENCOUNTER — Other Ambulatory Visit (HOSPITAL_COMMUNITY): Payer: Self-pay | Admitting: *Deleted

## 2013-01-31 DIAGNOSIS — Z01810 Encounter for preprocedural cardiovascular examination: Secondary | ICD-10-CM

## 2013-02-01 ENCOUNTER — Other Ambulatory Visit (HOSPITAL_COMMUNITY): Payer: Self-pay | Admitting: *Deleted

## 2013-02-01 ENCOUNTER — Encounter (HOSPITAL_COMMUNITY)
Admission: RE | Admit: 2013-02-01 | Discharge: 2013-02-01 | Disposition: A | Discharge status: Home / Self Care | Payer: Medicare Other | Source: Ambulatory Visit | Attending: Nephrology | Admitting: Nephrology

## 2013-02-01 DIAGNOSIS — N185 Chronic kidney disease, stage 5: Secondary | ICD-10-CM | POA: Insufficient documentation

## 2013-02-01 DIAGNOSIS — Z940 Kidney transplant status: Secondary | ICD-10-CM | POA: Insufficient documentation

## 2013-02-01 DIAGNOSIS — D638 Anemia in other chronic diseases classified elsewhere: Secondary | ICD-10-CM | POA: Insufficient documentation

## 2013-02-01 LAB — PREPARE RBC (CROSSMATCH)

## 2013-02-04 ENCOUNTER — Encounter: Payer: Self-pay | Admitting: Surgery

## 2013-02-04 ENCOUNTER — Encounter (HOSPITAL_COMMUNITY)
Admission: RE | Admit: 2013-02-04 | Discharge: 2013-02-04 | Disposition: A | Discharge status: Home / Self Care | Payer: Medicare Other | Source: Ambulatory Visit | Attending: Rheumatology | Admitting: Rheumatology

## 2013-02-04 LAB — POCT HEMOGLOBIN-HEMACUE: Hemoglobin: 7 g/dL — ABNORMAL LOW (ref 12.0–15.0)

## 2013-02-04 MED ORDER — SODIUM CHLORIDE 0.9 % IV SOLN
Freq: Once | INTRAVENOUS | Status: AC
  Administered 2013-02-04: 11:00:00 via INTRAVENOUS

## 2013-02-04 MED ORDER — SODIUM CHLORIDE 0.9 % IV SOLN
2.0000 g | Freq: Once | INTRAVENOUS | Status: AC
  Administered 2013-02-04: 16:00:00 2 g via INTRAVENOUS
  Filled 2013-02-04: qty 20

## 2013-02-04 MED ORDER — EPOETIN ALFA 10000 UNIT/ML IJ SOLN
INTRAMUSCULAR | Status: AC
  Administered 2013-02-04: 11:00:00 10000 [IU] via SUBCUTANEOUS
  Filled 2013-02-04: qty 1

## 2013-02-04 MED ORDER — EPOETIN ALFA 20000 UNIT/ML IJ SOLN
INTRAMUSCULAR | Status: AC
  Administered 2013-02-04: 20000 [IU] via SUBCUTANEOUS
  Filled 2013-02-04: qty 1

## 2013-02-04 MED ORDER — MAGNESIUM SULFATE 40 MG/ML IJ SOLN
2.0000 g | Freq: Once | INTRAMUSCULAR | Status: AC
  Administered 2013-02-04: 2 g via INTRAVENOUS
  Filled 2013-02-04: qty 50

## 2013-02-04 MED ORDER — EPOETIN ALFA 20000 UNIT/ML IJ SOLN: 30000.0000 [IU] | INTRAMUSCULAR | Status: DC

## 2013-02-05 LAB — TYPE AND SCREEN
ABO/RH(D): A NEG
Antibody Screen: NEGATIVE

## 2013-02-11 ENCOUNTER — Encounter (HOSPITAL_COMMUNITY)
Admission: RE | Admit: 2013-02-11 | Discharge: 2013-02-11 | Disposition: A | Discharge status: Home / Self Care | Payer: Medicare Other | Source: Ambulatory Visit | Attending: Nephrology | Admitting: Nephrology

## 2013-02-11 LAB — URINALYSIS, ROUTINE W REFLEX MICROSCOPIC
Ketones, ur: NEGATIVE mg/dL
Nitrite: NEGATIVE
Protein, ur: 100 mg/dL — AB
pH: 5.5 (ref 5.0–8.0)

## 2013-02-11 LAB — RENAL FUNCTION PANEL
CO2: 19 mEq/L (ref 19–32)
Calcium: 8.6 mg/dL (ref 8.4–10.5)
Chloride: 108 mEq/L (ref 96–112)
Creatinine, Ser: 4.17 mg/dL — ABNORMAL HIGH (ref 0.50–1.10)
GFR calc Af Amer: 13 mL/min — ABNORMAL LOW (ref >90)
Glucose, Bld: 96 mg/dL (ref 70–99)
Phosphorus: 1.7 mg/dL — ABNORMAL LOW (ref 2.3–4.6)
Sodium: 141 mEq/L (ref 135–145)

## 2013-02-11 LAB — IRON AND TIBC
Iron: 185 ug/dL — ABNORMAL HIGH (ref 42–135)
Saturation Ratios: 89 % — ABNORMAL HIGH (ref 20–55)
TIBC: 209 ug/dL — ABNORMAL LOW (ref 250–470)

## 2013-02-11 LAB — FERRITIN: Ferritin: 430 ng/mL — ABNORMAL HIGH (ref 10–291)

## 2013-02-11 LAB — MAGNESIUM: Magnesium: 1.4 mg/dL — ABNORMAL LOW (ref 1.5–2.5)

## 2013-02-11 MED ORDER — EPOETIN ALFA 20000 UNIT/ML IJ SOLN
INTRAMUSCULAR | Status: AC
  Administered 2013-02-11: 20000 [IU] via SUBCUTANEOUS
  Filled 2013-02-11: qty 1

## 2013-02-11 MED ORDER — EPOETIN ALFA 10000 UNIT/ML IJ SOLN
INTRAMUSCULAR | Status: AC
  Administered 2013-02-11: 14:00:00 10000 [IU] via SUBCUTANEOUS
  Filled 2013-02-11: qty 1

## 2013-02-11 MED ORDER — EPOETIN ALFA 20000 UNIT/ML IJ SOLN: 30000.0000 [IU] | INTRAMUSCULAR | Status: DC

## 2013-02-11 NOTE — Progress Notes (Signed)
Hgb today 7.9.  Pt has no complaints of SOB and states she has not seen any bleeding.  Also reported pt having difficulty walking but Dr Posey Pronto states they are aware, and the pt states she's been seen about it.  No new orders received.

## 2013-02-18 ENCOUNTER — Encounter (HOSPITAL_COMMUNITY)
Admission: RE | Admit: 2013-02-18 | Discharge: 2013-02-18 | Disposition: A | Discharge status: Home / Self Care | Payer: Medicare Other | Source: Ambulatory Visit | Attending: Nephrology | Admitting: Nephrology

## 2013-02-18 LAB — RENAL FUNCTION PANEL
BUN: 45 mg/dL — ABNORMAL HIGH (ref 6–23)
CO2: 23 mEq/L (ref 19–32)
Calcium: 8.6 mg/dL (ref 8.4–10.5)
Chloride: 106 mEq/L (ref 96–112)
Glucose, Bld: 86 mg/dL (ref 70–99)
Potassium: 3.9 mEq/L (ref 3.5–5.1)

## 2013-02-18 LAB — MAGNESIUM: Magnesium: 1.5 mg/dL (ref 1.5–2.5)

## 2013-02-18 LAB — POCT HEMOGLOBIN-HEMACUE: Hemoglobin: 7.4 g/dL — ABNORMAL LOW (ref 12.0–15.0)

## 2013-02-18 MED ORDER — EPOETIN ALFA 20000 UNIT/ML IJ SOLN: 30000.0000 [IU] | INTRAMUSCULAR | Status: DC

## 2013-02-18 MED ORDER — EPOETIN ALFA 40000 UNIT/ML IJ SOLN
INTRAMUSCULAR | Status: AC
  Filled 2013-02-18: qty 1

## 2013-02-18 MED ORDER — EPOETIN ALFA 40000 UNIT/ML IJ SOLN
40000.0000 [IU] | Freq: Once | INTRAMUSCULAR | Status: AC
  Administered 2013-02-18: 40000 [IU] via SUBCUTANEOUS

## 2013-02-25 ENCOUNTER — Other Ambulatory Visit (HOSPITAL_COMMUNITY): Payer: Self-pay | Admitting: *Deleted

## 2013-02-26 ENCOUNTER — Encounter (HOSPITAL_COMMUNITY)
Admission: RE | Admit: 2013-02-26 | Discharge: 2013-02-26 | Disposition: A | Discharge status: Home / Self Care | Payer: Medicare Other | Source: Ambulatory Visit | Attending: Nephrology | Admitting: Nephrology

## 2013-02-26 DIAGNOSIS — N184 Chronic kidney disease, stage 4 (severe): Secondary | ICD-10-CM | POA: Insufficient documentation

## 2013-02-26 DIAGNOSIS — Z940 Kidney transplant status: Secondary | ICD-10-CM | POA: Insufficient documentation

## 2013-02-26 DIAGNOSIS — D638 Anemia in other chronic diseases classified elsewhere: Secondary | ICD-10-CM | POA: Insufficient documentation

## 2013-02-26 LAB — POCT HEMOGLOBIN-HEMACUE: Hemoglobin: 7.2 g/dL — ABNORMAL LOW (ref 12.0–15.0)

## 2013-02-26 MED ORDER — EPOETIN ALFA 20000 UNIT/ML IJ SOLN: 40000.0000 [IU] | INTRAMUSCULAR | Status: DC

## 2013-02-26 MED ORDER — EPOETIN ALFA 40000 UNIT/ML IJ SOLN
INTRAMUSCULAR | Status: AC
  Administered 2013-02-26: 40000 [IU] via SUBCUTANEOUS
  Filled 2013-02-26: qty 1

## 2013-03-04 ENCOUNTER — Encounter (HOSPITAL_COMMUNITY): Payer: Medicare Other

## 2013-03-04 ENCOUNTER — Ambulatory Visit: Payer: Medicare Other | Admitting: Surgery

## 2013-03-04 ENCOUNTER — Other Ambulatory Visit (HOSPITAL_COMMUNITY): Payer: Medicare Other

## 2013-03-05 ENCOUNTER — Encounter (HOSPITAL_COMMUNITY)
Admission: RE | Admit: 2013-03-05 | Discharge: 2013-03-05 | Disposition: A | Discharge status: Home / Self Care | Payer: Medicare Other | Source: Ambulatory Visit | Attending: Nephrology | Admitting: Nephrology

## 2013-03-05 DIAGNOSIS — D638 Anemia in other chronic diseases classified elsewhere: Secondary | ICD-10-CM | POA: Insufficient documentation

## 2013-03-05 DIAGNOSIS — N185 Chronic kidney disease, stage 5: Secondary | ICD-10-CM | POA: Insufficient documentation

## 2013-03-05 DIAGNOSIS — Z940 Kidney transplant status: Secondary | ICD-10-CM | POA: Insufficient documentation

## 2013-03-05 LAB — RENAL FUNCTION PANEL
Albumin: 3.8 g/dL (ref 3.5–5.2)
BUN: 50 mg/dL — ABNORMAL HIGH (ref 6–23)
CALCIUM: 9.1 mg/dL (ref 8.4–10.5)
CO2: 20 mEq/L (ref 19–32)
CREATININE: 3.82 mg/dL — AB (ref 0.50–1.10)
Chloride: 111 mEq/L (ref 96–112)
GFR calc Af Amer: 14 mL/min — ABNORMAL LOW (ref >90)
GFR, EST NON AFRICAN AMERICAN: 12 mL/min — AB (ref >90)
GLUCOSE: 103 mg/dL — AB (ref 70–99)
Phosphorus: 1.7 mg/dL — ABNORMAL LOW (ref 2.3–4.6)
Potassium: 4.5 mEq/L (ref 3.7–5.3)
Sodium: 145 mEq/L (ref 137–147)

## 2013-03-05 LAB — POCT HEMOGLOBIN-HEMACUE: HEMOGLOBIN: 7.1 g/dL — AB (ref 12.0–15.0)

## 2013-03-05 MED ORDER — EPOETIN ALFA 40000 UNIT/ML IJ SOLN
INTRAMUSCULAR | Status: AC
  Administered 2013-03-05: 14:00:00 40000 [IU] via SUBCUTANEOUS
  Filled 2013-03-05: qty 1

## 2013-03-05 MED ORDER — EPOETIN ALFA 20000 UNIT/ML IJ SOLN: 40000.0000 [IU] | INTRAMUSCULAR | Status: DC

## 2013-03-05 NOTE — Progress Notes (Signed)
Called Crystal at [facility]. hgb reported. No changes per Dr. Posey Pronto. Patient aware.

## 2013-03-11 ENCOUNTER — Other Ambulatory Visit (HOSPITAL_COMMUNITY): Payer: Self-pay | Admitting: *Deleted

## 2013-03-11 ENCOUNTER — Encounter (HOSPITAL_COMMUNITY)
Admission: RE | Admit: 2013-03-11 | Discharge: 2013-03-11 | Disposition: A | Discharge status: Home / Self Care | Payer: Medicare Other | Source: Ambulatory Visit | Attending: Nephrology | Admitting: Nephrology

## 2013-03-11 LAB — IRON AND TIBC
Iron: 116 ug/dL (ref 42–135)
SATURATION RATIOS: 56 % — AB (ref 20–55)
TIBC: 206 ug/dL — ABNORMAL LOW (ref 250–470)
UIBC: 90 ug/dL — ABNORMAL LOW (ref 125–400)

## 2013-03-11 LAB — FERRITIN: Ferritin: 354 ng/mL — ABNORMAL HIGH (ref 10–291)

## 2013-03-11 LAB — PREPARE RBC (CROSSMATCH)

## 2013-03-11 LAB — POCT HEMOGLOBIN-HEMACUE: HEMOGLOBIN: 6.3 g/dL — AB (ref 12.0–15.0)

## 2013-03-11 MED ORDER — EPOETIN ALFA 40000 UNIT/ML IJ SOLN
INTRAMUSCULAR | Status: AC
  Administered 2013-03-11: 11:00:00 40000 [IU] via SUBCUTANEOUS
  Filled 2013-03-11: qty 1

## 2013-03-11 MED ORDER — EPOETIN ALFA 20000 UNIT/ML IJ SOLN: 40000.0000 [IU] | INTRAMUSCULAR | Status: DC

## 2013-03-11 NOTE — Progress Notes (Addendum)
Hemoglobin phoned to Alinda Sierras, Dr Serita Grit CMA,  She will advise MD and phone back with further instructions.  Patient states she feels good and her physical therapist has increased her activity. States she is experiencing no shortness of breath and has seen not noted and bleeding.  Alinda Sierras advised.  She will advise Dr Posey Pronto.  Orders recieved Will type and crossmatch for 2 units of packed RBC's and have given patient appointment to have transfusion 03/12/2013 per order

## 2013-03-12 ENCOUNTER — Encounter (HOSPITAL_COMMUNITY)
Admission: RE | Admit: 2013-03-12 | Discharge: 2013-03-12 | Disposition: A | Discharge status: Home / Self Care | Payer: Medicare Other | Source: Ambulatory Visit | Attending: Nephrology | Admitting: Nephrology

## 2013-03-13 LAB — TYPE AND SCREEN
ABO/RH(D): A NEG
Antibody Screen: NEGATIVE
UNIT DIVISION: 0
Unit division: 0

## 2013-03-18 ENCOUNTER — Encounter (HOSPITAL_COMMUNITY): Payer: Medicare Other

## 2013-03-22 ENCOUNTER — Encounter (HOSPITAL_COMMUNITY)
Admission: RE | Admit: 2013-03-22 | Discharge: 2013-03-22 | Disposition: A | Discharge status: Home / Self Care | Payer: Medicare Other | Source: Ambulatory Visit | Attending: Nephrology | Admitting: Nephrology

## 2013-03-22 LAB — RENAL FUNCTION PANEL
ALBUMIN: 3.5 g/dL (ref 3.5–5.2)
BUN: 41 mg/dL — AB (ref 6–23)
CALCIUM: 8.5 mg/dL (ref 8.4–10.5)
CO2: 22 mEq/L (ref 19–32)
CREATININE: 3.45 mg/dL — AB (ref 0.50–1.10)
Chloride: 107 mEq/L (ref 96–112)
GFR calc Af Amer: 16 mL/min — ABNORMAL LOW (ref >90)
GFR, EST NON AFRICAN AMERICAN: 14 mL/min — AB (ref >90)
Glucose, Bld: 77 mg/dL (ref 70–99)
Phosphorus: 1.4 mg/dL — ABNORMAL LOW (ref 2.3–4.6)
Potassium: 4 mEq/L (ref 3.7–5.3)
Sodium: 144 mEq/L (ref 137–147)

## 2013-03-22 LAB — POCT HEMOGLOBIN-HEMACUE: Hemoglobin: 10.2 g/dL — ABNORMAL LOW (ref 12.0–15.0)

## 2013-03-22 MED ORDER — EPOETIN ALFA 20000 UNIT/ML IJ SOLN: 40000.0000 [IU] | INTRAMUSCULAR | Status: DC

## 2013-03-22 MED ORDER — EPOETIN ALFA 40000 UNIT/ML IJ SOLN
INTRAMUSCULAR | Status: AC
  Administered 2013-03-22: 40000 [IU] via SUBCUTANEOUS
  Filled 2013-03-22: qty 1

## 2013-03-27 ENCOUNTER — Inpatient Hospital Stay (HOSPITAL_COMMUNITY): Admission: RE | Admit: 2013-03-27 | Payer: Medicare Other | Source: Ambulatory Visit

## 2013-04-01 ENCOUNTER — Encounter (HOSPITAL_COMMUNITY)
Admission: RE | Admit: 2013-04-01 | Discharge: 2013-04-01 | Disposition: A | Discharge status: Home / Self Care | Payer: Medicare Other | Source: Ambulatory Visit | Attending: Nephrology | Admitting: Nephrology

## 2013-04-01 DIAGNOSIS — D638 Anemia in other chronic diseases classified elsewhere: Secondary | ICD-10-CM | POA: Insufficient documentation

## 2013-04-01 DIAGNOSIS — N185 Chronic kidney disease, stage 5: Secondary | ICD-10-CM | POA: Insufficient documentation

## 2013-04-01 DIAGNOSIS — Z940 Kidney transplant status: Secondary | ICD-10-CM | POA: Insufficient documentation

## 2013-04-01 LAB — POCT HEMOGLOBIN-HEMACUE: HEMOGLOBIN: 9.9 g/dL — AB (ref 12.0–15.0)

## 2013-04-01 LAB — RENAL FUNCTION PANEL
Albumin: 3.8 g/dL (ref 3.5–5.2)
BUN: 46 mg/dL — AB (ref 6–23)
CO2: 21 meq/L (ref 19–32)
Calcium: 8.9 mg/dL (ref 8.4–10.5)
Chloride: 105 mEq/L (ref 96–112)
Creatinine, Ser: 3.68 mg/dL — ABNORMAL HIGH (ref 0.50–1.10)
GFR calc non Af Amer: 13 mL/min — ABNORMAL LOW (ref >90)
GFR, EST AFRICAN AMERICAN: 15 mL/min — AB (ref >90)
Glucose, Bld: 80 mg/dL (ref 70–99)
POTASSIUM: 4.3 meq/L (ref 3.7–5.3)
Phosphorus: 1.7 mg/dL — ABNORMAL LOW (ref 2.3–4.6)
SODIUM: 141 meq/L (ref 137–147)

## 2013-04-01 MED ORDER — EPOETIN ALFA 40000 UNIT/ML IJ SOLN
INTRAMUSCULAR | Status: AC
  Administered 2013-04-01: 40000 [IU] via SUBCUTANEOUS
  Filled 2013-04-01: qty 1

## 2013-04-01 MED ORDER — EPOETIN ALFA 20000 UNIT/ML IJ SOLN: 40000.0000 [IU] | INTRAMUSCULAR | Status: DC

## 2013-04-08 ENCOUNTER — Encounter (HOSPITAL_COMMUNITY)
Admission: RE | Admit: 2013-04-08 | Discharge: 2013-04-08 | Disposition: A | Discharge status: Home / Self Care | Payer: Medicare Other | Source: Ambulatory Visit | Attending: Nephrology | Admitting: Nephrology

## 2013-04-08 LAB — IRON AND TIBC
Iron: 115 ug/dL (ref 42–135)
SATURATION RATIOS: 56 % — AB (ref 20–55)
TIBC: 206 ug/dL — AB (ref 250–470)
UIBC: 91 ug/dL — AB (ref 125–400)

## 2013-04-08 LAB — FERRITIN: Ferritin: 478 ng/mL — ABNORMAL HIGH (ref 10–291)

## 2013-04-08 LAB — POCT HEMOGLOBIN-HEMACUE: Hemoglobin: 10.2 g/dL — ABNORMAL LOW (ref 12.0–15.0)

## 2013-04-08 MED ORDER — EPOETIN ALFA 20000 UNIT/ML IJ SOLN: 40000.0000 [IU] | INTRAMUSCULAR | Status: DC

## 2013-04-08 MED ORDER — EPOETIN ALFA 40000 UNIT/ML IJ SOLN
INTRAMUSCULAR | Status: AC
  Administered 2013-04-08: 40000 [IU]
  Filled 2013-04-08: qty 1

## 2013-04-12 ENCOUNTER — Other Ambulatory Visit (HOSPITAL_COMMUNITY): Payer: Self-pay

## 2013-04-15 ENCOUNTER — Encounter (HOSPITAL_COMMUNITY)
Admission: RE | Admit: 2013-04-15 | Discharge: 2013-04-15 | Disposition: A | Discharge status: Home / Self Care | Payer: Medicare Other | Source: Ambulatory Visit | Attending: Nephrology | Admitting: Nephrology

## 2013-04-15 LAB — RENAL FUNCTION PANEL
Albumin: 3.8 g/dL (ref 3.5–5.2)
BUN: 42 mg/dL — ABNORMAL HIGH (ref 6–23)
CALCIUM: 9.2 mg/dL (ref 8.4–10.5)
CO2: 22 meq/L (ref 19–32)
CREATININE: 4 mg/dL — AB (ref 0.50–1.10)
Chloride: 107 mEq/L (ref 96–112)
GFR calc Af Amer: 14 mL/min — ABNORMAL LOW (ref >90)
GFR calc non Af Amer: 12 mL/min — ABNORMAL LOW (ref >90)
GLUCOSE: 91 mg/dL (ref 70–99)
Phosphorus: 2.4 mg/dL (ref 2.3–4.6)
Potassium: 4.1 mEq/L (ref 3.7–5.3)
Sodium: 142 mEq/L (ref 137–147)

## 2013-04-15 LAB — POCT HEMOGLOBIN-HEMACUE: Hemoglobin: 9.3 g/dL — ABNORMAL LOW (ref 12.0–15.0)

## 2013-04-15 MED ORDER — EPOETIN ALFA 40000 UNIT/ML IJ SOLN
INTRAMUSCULAR | Status: AC
  Administered 2013-04-15: 40000 [IU] via SUBCUTANEOUS
  Filled 2013-04-15: qty 1

## 2013-04-15 MED ORDER — EPOETIN ALFA 20000 UNIT/ML IJ SOLN: 40000.0000 [IU] | INTRAMUSCULAR | Status: DC

## 2013-04-22 ENCOUNTER — Other Ambulatory Visit (HOSPITAL_COMMUNITY): Payer: Medicare Other

## 2013-04-22 ENCOUNTER — Encounter (HOSPITAL_COMMUNITY)
Admission: RE | Admit: 2013-04-22 | Discharge: 2013-04-22 | Disposition: A | Discharge status: Home / Self Care | Payer: Medicare Other | Source: Ambulatory Visit | Attending: Nephrology | Admitting: Nephrology

## 2013-04-22 ENCOUNTER — Encounter (HOSPITAL_COMMUNITY): Payer: Medicare Other

## 2013-04-22 ENCOUNTER — Ambulatory Visit: Payer: Medicare Other | Admitting: Surgery

## 2013-04-22 LAB — POCT HEMOGLOBIN-HEMACUE: Hemoglobin: 9 g/dL — ABNORMAL LOW (ref 12.0–15.0)

## 2013-04-22 MED ORDER — EPOETIN ALFA 20000 UNIT/ML IJ SOLN: 40000.0000 [IU] | INTRAMUSCULAR | Status: DC

## 2013-04-22 MED ORDER — EPOETIN ALFA 40000 UNIT/ML IJ SOLN
INTRAMUSCULAR | Status: AC
  Administered 2013-04-22: 40000 [IU]
  Filled 2013-04-22: qty 1

## 2013-04-26 ENCOUNTER — Other Ambulatory Visit (HOSPITAL_COMMUNITY): Payer: Self-pay | Admitting: *Deleted

## 2013-04-29 ENCOUNTER — Encounter (HOSPITAL_COMMUNITY)
Admission: RE | Admit: 2013-04-29 | Discharge: 2013-04-29 | Disposition: A | Discharge status: Home / Self Care | Payer: Medicare Other | Source: Ambulatory Visit | Attending: Nephrology | Admitting: Nephrology

## 2013-04-29 DIAGNOSIS — N185 Chronic kidney disease, stage 5: Secondary | ICD-10-CM | POA: Insufficient documentation

## 2013-04-29 DIAGNOSIS — Z940 Kidney transplant status: Secondary | ICD-10-CM | POA: Insufficient documentation

## 2013-04-29 DIAGNOSIS — D638 Anemia in other chronic diseases classified elsewhere: Secondary | ICD-10-CM | POA: Insufficient documentation

## 2013-04-29 LAB — URINALYSIS, ROUTINE W REFLEX MICROSCOPIC
Bilirubin Urine: NEGATIVE
Glucose, UA: NEGATIVE mg/dL
Hgb urine dipstick: NEGATIVE
Ketones, ur: NEGATIVE mg/dL
LEUKOCYTES UA: NEGATIVE
NITRITE: NEGATIVE
PH: 6 (ref 5.0–8.0)
Protein, ur: 100 mg/dL — AB
SPECIFIC GRAVITY, URINE: 1.017 (ref 1.005–1.030)
Urobilinogen, UA: 0.2 mg/dL (ref 0.0–1.0)

## 2013-04-29 LAB — RENAL FUNCTION PANEL
Albumin: 3.8 g/dL (ref 3.5–5.2)
BUN: 41 mg/dL — ABNORMAL HIGH (ref 6–23)
CHLORIDE: 109 meq/L (ref 96–112)
CO2: 19 meq/L (ref 19–32)
CREATININE: 3.62 mg/dL — AB (ref 0.50–1.10)
Calcium: 8.9 mg/dL (ref 8.4–10.5)
GFR calc Af Amer: 15 mL/min — ABNORMAL LOW (ref >90)
GFR calc non Af Amer: 13 mL/min — ABNORMAL LOW (ref >90)
Glucose, Bld: 90 mg/dL (ref 70–99)
Phosphorus: 1.9 mg/dL — ABNORMAL LOW (ref 2.3–4.6)
Potassium: 4.2 mEq/L (ref 3.7–5.3)
Sodium: 142 mEq/L (ref 137–147)

## 2013-04-29 LAB — URINE MICROSCOPIC-ADD ON

## 2013-04-29 LAB — MAGNESIUM: MAGNESIUM: 1.9 mg/dL (ref 1.5–2.5)

## 2013-04-29 LAB — HEPATITIS B SURFACE ANTIGEN: HEP B S AG: NEGATIVE

## 2013-04-29 LAB — POCT HEMOGLOBIN-HEMACUE: Hemoglobin: 9.1 g/dL — ABNORMAL LOW (ref 12.0–15.0)

## 2013-04-29 MED ORDER — EPOETIN ALFA 20000 UNIT/ML IJ SOLN: 40000.0000 [IU] | INTRAMUSCULAR | Status: DC

## 2013-04-29 MED ORDER — EPOETIN ALFA 40000 UNIT/ML IJ SOLN
INTRAMUSCULAR | Status: AC
  Administered 2013-04-29: 40000 [IU]
  Filled 2013-04-29: qty 1

## 2013-04-30 LAB — BK VIRUS QUANT PCR, URINE: BK virus DNA, quant PCR: <500 copies/mL (ref <500)

## 2013-04-30 LAB — PTH, INTACT AND CALCIUM
Calcium, Total (PTH): 8.8 mg/dL (ref 8.4–10.5)
PTH: 1126.5 pg/mL — ABNORMAL HIGH (ref 14.0–72.0)

## 2013-04-30 LAB — VITAMIN D 25 HYDROXY (VIT D DEFICIENCY, FRACTURES): Vit D, 25-Hydroxy: 29 ng/mL — ABNORMAL LOW (ref 30–89)

## 2013-05-01 LAB — CYTOMEGALOVIRUS PCR, QUALITATIVE: Cytomegalovirus DNA: NOT DETECTED

## 2013-05-06 ENCOUNTER — Encounter (HOSPITAL_COMMUNITY)
Admission: RE | Admit: 2013-05-06 | Discharge: 2013-05-06 | Disposition: A | Discharge status: Home / Self Care | Payer: Medicare Other | Source: Ambulatory Visit | Attending: Nephrology | Admitting: Nephrology

## 2013-05-06 LAB — IRON AND TIBC
Iron: 201 ug/dL — ABNORMAL HIGH (ref 42–135)
Saturation Ratios: 88 % — ABNORMAL HIGH (ref 20–55)
TIBC: 229 ug/dL — AB (ref 250–470)
UIBC: 28 ug/dL — ABNORMAL LOW (ref 125–400)

## 2013-05-06 LAB — POCT HEMOGLOBIN-HEMACUE: HEMOGLOBIN: 10 g/dL — AB (ref 12.0–15.0)

## 2013-05-06 LAB — FERRITIN: FERRITIN: 608 ng/mL — AB (ref 10–291)

## 2013-05-06 MED ORDER — EPOETIN ALFA 40000 UNIT/ML IJ SOLN
INTRAMUSCULAR | Status: AC
  Administered 2013-05-06: 40000 [IU] via SUBCUTANEOUS
  Filled 2013-05-06: qty 1

## 2013-05-06 MED ORDER — EPOETIN ALFA 20000 UNIT/ML IJ SOLN: 40000.0000 [IU] | INTRAMUSCULAR | Status: DC

## 2013-05-10 LAB — TACROLIMUS LEVEL: TACROLIMUS (FK506) - LABCORP: 8.1 ng/mL

## 2013-05-13 ENCOUNTER — Encounter (HOSPITAL_COMMUNITY)
Admission: RE | Admit: 2013-05-13 | Discharge: 2013-05-13 | Disposition: A | Discharge status: Home / Self Care | Payer: Medicare Other | Source: Ambulatory Visit | Attending: Nephrology | Admitting: Nephrology

## 2013-05-13 LAB — RENAL FUNCTION PANEL
ALBUMIN: 3.9 g/dL (ref 3.5–5.2)
BUN: 55 mg/dL — AB (ref 6–23)
CHLORIDE: 106 meq/L (ref 96–112)
CO2: 15 mEq/L — ABNORMAL LOW (ref 19–32)
CREATININE: 3.95 mg/dL — AB (ref 0.50–1.10)
Calcium: 8.8 mg/dL (ref 8.4–10.5)
GFR calc Af Amer: 14 mL/min — ABNORMAL LOW (ref >90)
GFR, EST NON AFRICAN AMERICAN: 12 mL/min — AB (ref >90)
Glucose, Bld: 81 mg/dL (ref 70–99)
PHOSPHORUS: 1.8 mg/dL — AB (ref 2.3–4.6)
Potassium: 3.4 mEq/L — ABNORMAL LOW (ref 3.7–5.3)
Sodium: 138 mEq/L (ref 137–147)

## 2013-05-13 LAB — POCT HEMOGLOBIN-HEMACUE: Hemoglobin: 9.7 g/dL — ABNORMAL LOW (ref 12.0–15.0)

## 2013-05-13 MED ORDER — EPOETIN ALFA 40000 UNIT/ML IJ SOLN
INTRAMUSCULAR | Status: AC
  Administered 2013-05-13: 40000 [IU] via SUBCUTANEOUS
  Filled 2013-05-13: qty 1

## 2013-05-13 MED ORDER — EPOETIN ALFA 20000 UNIT/ML IJ SOLN: 40000.0000 [IU] | INTRAMUSCULAR | Status: DC

## 2013-05-20 ENCOUNTER — Encounter (HOSPITAL_COMMUNITY)
Admission: RE | Admit: 2013-05-20 | Discharge: 2013-05-20 | Disposition: A | Discharge status: Home / Self Care | Payer: Medicare Other | Source: Ambulatory Visit | Attending: Nephrology | Admitting: Nephrology

## 2013-05-20 LAB — POCT HEMOGLOBIN-HEMACUE: HEMOGLOBIN: 8.2 g/dL — AB (ref 12.0–15.0)

## 2013-05-20 MED ORDER — EPOETIN ALFA 20000 UNIT/ML IJ SOLN: 40000.0000 [IU] | INTRAMUSCULAR | Status: DC

## 2013-05-20 MED ORDER — EPOETIN ALFA 40000 UNIT/ML IJ SOLN
INTRAMUSCULAR | Status: AC
  Administered 2013-05-20: 40000 [IU] via SUBCUTANEOUS
  Filled 2013-05-20: qty 1

## 2013-05-27 ENCOUNTER — Encounter (HOSPITAL_COMMUNITY)
Admission: RE | Admit: 2013-05-27 | Discharge: 2013-05-27 | Disposition: A | Discharge status: Home / Self Care | Payer: Medicare Other | Source: Ambulatory Visit | Attending: Nephrology | Admitting: Nephrology

## 2013-05-27 LAB — RENAL FUNCTION PANEL
Albumin: 3.6 g/dL (ref 3.5–5.2)
BUN: 63 mg/dL — AB (ref 6–23)
CO2: 14 meq/L — AB (ref 19–32)
CREATININE: 4.48 mg/dL — AB (ref 0.50–1.10)
Calcium: 7.8 mg/dL — ABNORMAL LOW (ref 8.4–10.5)
Chloride: 110 mEq/L (ref 96–112)
GFR calc Af Amer: 12 mL/min — ABNORMAL LOW (ref >90)
GFR, EST NON AFRICAN AMERICAN: 10 mL/min — AB (ref >90)
Glucose, Bld: 95 mg/dL (ref 70–99)
Phosphorus: 2.1 mg/dL — ABNORMAL LOW (ref 2.3–4.6)
Potassium: 2.8 mEq/L — CL (ref 3.7–5.3)
Sodium: 144 mEq/L (ref 137–147)

## 2013-05-27 LAB — POCT HEMOGLOBIN-HEMACUE: Hemoglobin: 8.5 g/dL — ABNORMAL LOW (ref 12.0–15.0)

## 2013-05-27 LAB — MAGNESIUM: Magnesium: 1.3 mg/dL — ABNORMAL LOW (ref 1.5–2.5)

## 2013-05-27 MED ORDER — EPOETIN ALFA 40000 UNIT/ML IJ SOLN
INTRAMUSCULAR | Status: AC
Start: 2013-05-27 — End: 2013-05-27
  Administered 2013-05-27: 40000 [IU] via SUBCUTANEOUS
  Filled 2013-05-27: qty 1

## 2013-05-27 MED ORDER — EPOETIN ALFA 20000 UNIT/ML IJ SOLN: 40000.0000 [IU] | INTRAMUSCULAR | Status: DC

## 2013-06-03 ENCOUNTER — Encounter (HOSPITAL_COMMUNITY)
Admission: RE | Admit: 2013-06-03 | Discharge: 2013-06-03 | Disposition: A | Discharge status: Home / Self Care | Payer: Medicare Other | Source: Ambulatory Visit | Attending: Nephrology | Admitting: Nephrology

## 2013-06-03 ENCOUNTER — Other Ambulatory Visit (HOSPITAL_COMMUNITY): Payer: Self-pay | Admitting: *Deleted

## 2013-06-03 DIAGNOSIS — Z940 Kidney transplant status: Secondary | ICD-10-CM | POA: Insufficient documentation

## 2013-06-03 DIAGNOSIS — N185 Chronic kidney disease, stage 5: Secondary | ICD-10-CM | POA: Insufficient documentation

## 2013-06-03 DIAGNOSIS — D638 Anemia in other chronic diseases classified elsewhere: Secondary | ICD-10-CM | POA: Insufficient documentation

## 2013-06-03 LAB — MAGNESIUM: Magnesium: 1.3 mg/dL — ABNORMAL LOW (ref 1.5–2.5)

## 2013-06-03 LAB — IRON AND TIBC
Iron: 175 ug/dL — ABNORMAL HIGH (ref 42–135)
UIBC: <15 ug/dL — ABNORMAL LOW (ref 125–400)

## 2013-06-03 LAB — BASIC METABOLIC PANEL
BUN: 67 mg/dL — AB (ref 6–23)
CO2: 13 mEq/L — ABNORMAL LOW (ref 19–32)
Calcium: 7.8 mg/dL — ABNORMAL LOW (ref 8.4–10.5)
Chloride: 110 mEq/L (ref 96–112)
Creatinine, Ser: 4.3 mg/dL — ABNORMAL HIGH (ref 0.50–1.10)
GFR calc Af Amer: 12 mL/min — ABNORMAL LOW (ref >90)
GFR, EST NON AFRICAN AMERICAN: 11 mL/min — AB (ref >90)
GLUCOSE: 90 mg/dL (ref 70–99)
POTASSIUM: 3.9 meq/L (ref 3.7–5.3)
Sodium: 142 mEq/L (ref 137–147)

## 2013-06-03 LAB — FERRITIN: Ferritin: 444 ng/mL — ABNORMAL HIGH (ref 10–291)

## 2013-06-03 MED ORDER — EPOETIN ALFA 40000 UNIT/ML IJ SOLN
INTRAMUSCULAR | Status: AC
  Administered 2013-06-03: 40000 [IU] via SUBCUTANEOUS
  Filled 2013-06-03: qty 1

## 2013-06-03 MED ORDER — EPOETIN ALFA 10000 UNIT/ML IJ SOLN
INTRAMUSCULAR | Status: AC
  Administered 2013-06-03: 10000 [IU] via SUBCUTANEOUS
  Filled 2013-06-03: qty 1

## 2013-06-03 MED ORDER — EPOETIN ALFA 40000 UNIT/ML IJ SOLN: 50000.0000 [IU] | Freq: Once | INTRAMUSCULAR | Status: DC

## 2013-06-03 MED ORDER — EPOETIN ALFA 20000 UNIT/ML IJ SOLN: 40000.0000 [IU] | INTRAMUSCULAR | Status: DC

## 2013-06-04 LAB — POCT HEMOGLOBIN-HEMACUE: Hemoglobin: 7.6 g/dL — ABNORMAL LOW (ref 12.0–15.0)

## 2013-06-10 ENCOUNTER — Inpatient Hospital Stay (HOSPITAL_COMMUNITY): Admission: RE | Admit: 2013-06-10 | Payer: Medicare Other | Source: Ambulatory Visit

## 2013-06-12 ENCOUNTER — Encounter (HOSPITAL_COMMUNITY)
Admission: RE | Admit: 2013-06-12 | Discharge: 2013-06-12 | Disposition: A | Discharge status: Home / Self Care | Payer: Medicare Other | Source: Ambulatory Visit | Attending: Nephrology | Admitting: Nephrology

## 2013-06-12 ENCOUNTER — Encounter (HOSPITAL_COMMUNITY): Payer: Self-pay | Admitting: Emergency Medicine

## 2013-06-12 ENCOUNTER — Inpatient Hospital Stay (HOSPITAL_COMMUNITY)
Admission: EM | Admit: 2013-06-12 | Discharge: 2013-06-20 | DRG: 808 | Disposition: A | Discharge status: Rehabilitation Facility | Payer: Medicare Other | Attending: Family Medicine | Admitting: Family Medicine

## 2013-06-12 ENCOUNTER — Other Ambulatory Visit: Payer: Self-pay

## 2013-06-12 ENCOUNTER — Emergency Department (HOSPITAL_COMMUNITY): Payer: Medicare Other

## 2013-06-12 DIAGNOSIS — R5381 Other malaise: Secondary | ICD-10-CM | POA: Diagnosis present

## 2013-06-12 DIAGNOSIS — E43 Unspecified severe protein-calorie malnutrition: Secondary | ICD-10-CM | POA: Diagnosis present

## 2013-06-12 DIAGNOSIS — N2581 Secondary hyperparathyroidism of renal origin: Secondary | ICD-10-CM | POA: Diagnosis present

## 2013-06-12 DIAGNOSIS — N039 Chronic nephritic syndrome with unspecified morphologic changes: Secondary | ICD-10-CM | POA: Diagnosis present

## 2013-06-12 DIAGNOSIS — Y830 Surgical operation with transplant of whole organ as the cause of abnormal reaction of the patient, or of later complication, without mention of misadventure at the time of the procedure: Secondary | ICD-10-CM | POA: Diagnosis present

## 2013-06-12 DIAGNOSIS — I959 Hypotension, unspecified: Secondary | ICD-10-CM | POA: Diagnosis not present

## 2013-06-12 DIAGNOSIS — T8612 Kidney transplant failure: Secondary | ICD-10-CM

## 2013-06-12 DIAGNOSIS — D631 Anemia in chronic kidney disease: Secondary | ICD-10-CM | POA: Diagnosis present

## 2013-06-12 DIAGNOSIS — R630 Anorexia: Secondary | ICD-10-CM | POA: Diagnosis present

## 2013-06-12 DIAGNOSIS — R64 Cachexia: Secondary | ICD-10-CM | POA: Diagnosis present

## 2013-06-12 DIAGNOSIS — Q602 Renal agenesis, unspecified: Secondary | ICD-10-CM

## 2013-06-12 DIAGNOSIS — Z8673 Personal history of transient ischemic attack (TIA), and cerebral infarction without residual deficits: Secondary | ICD-10-CM

## 2013-06-12 DIAGNOSIS — R0781 Pleurodynia: Secondary | ICD-10-CM

## 2013-06-12 DIAGNOSIS — Z681 Body mass index (BMI) 19 or less, adult: Secondary | ICD-10-CM

## 2013-06-12 DIAGNOSIS — E872 Acidosis, unspecified: Secondary | ICD-10-CM | POA: Diagnosis present

## 2013-06-12 DIAGNOSIS — R634 Abnormal weight loss: Secondary | ICD-10-CM | POA: Diagnosis present

## 2013-06-12 DIAGNOSIS — T8611 Kidney transplant rejection: Secondary | ICD-10-CM | POA: Diagnosis present

## 2013-06-12 DIAGNOSIS — Z7901 Long term (current) use of anticoagulants: Secondary | ICD-10-CM

## 2013-06-12 DIAGNOSIS — R4181 Age-related cognitive decline: Secondary | ICD-10-CM | POA: Diagnosis present

## 2013-06-12 DIAGNOSIS — E876 Hypokalemia: Secondary | ICD-10-CM | POA: Diagnosis present

## 2013-06-12 DIAGNOSIS — I129 Hypertensive chronic kidney disease with stage 1 through stage 4 chronic kidney disease, or unspecified chronic kidney disease: Secondary | ICD-10-CM | POA: Diagnosis present

## 2013-06-12 DIAGNOSIS — I824Y9 Acute embolism and thrombosis of unspecified deep veins of unspecified proximal lower extremity: Secondary | ICD-10-CM | POA: Diagnosis not present

## 2013-06-12 DIAGNOSIS — Z515 Encounter for palliative care: Secondary | ICD-10-CM

## 2013-06-12 DIAGNOSIS — N184 Chronic kidney disease, stage 4 (severe): Secondary | ICD-10-CM | POA: Diagnosis present

## 2013-06-12 DIAGNOSIS — Q605 Renal hypoplasia, unspecified: Secondary | ICD-10-CM

## 2013-06-12 DIAGNOSIS — N250 Renal osteodystrophy: Secondary | ICD-10-CM | POA: Diagnosis present

## 2013-06-12 DIAGNOSIS — Z79899 Other long term (current) drug therapy: Secondary | ICD-10-CM

## 2013-06-12 DIAGNOSIS — Z881 Allergy status to other antibiotic agents status: Secondary | ICD-10-CM

## 2013-06-12 DIAGNOSIS — D61818 Other pancytopenia: Principal | ICD-10-CM | POA: Diagnosis present

## 2013-06-12 DIAGNOSIS — T451X5A Adverse effect of antineoplastic and immunosuppressive drugs, initial encounter: Secondary | ICD-10-CM | POA: Diagnosis present

## 2013-06-12 DIAGNOSIS — D649 Anemia, unspecified: Secondary | ICD-10-CM | POA: Diagnosis present

## 2013-06-12 DIAGNOSIS — E46 Unspecified protein-calorie malnutrition: Secondary | ICD-10-CM

## 2013-06-12 DIAGNOSIS — Z940 Kidney transplant status: Secondary | ICD-10-CM

## 2013-06-12 DIAGNOSIS — D696 Thrombocytopenia, unspecified: Secondary | ICD-10-CM

## 2013-06-12 HISTORY — DX: Chronic kidney disease, stage 4 (severe): N18.4

## 2013-06-12 HISTORY — DX: Unspecified complication of kidney transplant: T86.10

## 2013-06-12 HISTORY — DX: Adverse effect of antineoplastic and immunosuppressive drugs, initial encounter: T45.1X5A

## 2013-06-12 HISTORY — DX: Other pancytopenia: D61.818

## 2013-06-12 HISTORY — DX: Abnormal weight loss: R63.4

## 2013-06-12 LAB — BASIC METABOLIC PANEL
BUN: 66 mg/dL — ABNORMAL HIGH (ref 6–23)
CO2: 14 meq/L — AB (ref 19–32)
Calcium: 7.4 mg/dL — ABNORMAL LOW (ref 8.4–10.5)
Chloride: 112 mEq/L (ref 96–112)
Creatinine, Ser: 4.29 mg/dL — ABNORMAL HIGH (ref 0.50–1.10)
GFR calc Af Amer: 12 mL/min — ABNORMAL LOW (ref >90)
GFR calc non Af Amer: 11 mL/min — ABNORMAL LOW (ref >90)
Glucose, Bld: 92 mg/dL (ref 70–99)
POTASSIUM: 4 meq/L (ref 3.7–5.3)
SODIUM: 143 meq/L (ref 137–147)

## 2013-06-12 LAB — IRON AND TIBC
Iron: 180 ug/dL — ABNORMAL HIGH (ref 42–135)
UIBC: <15 ug/dL — ABNORMAL LOW (ref 125–400)

## 2013-06-12 LAB — CBC
HCT: 23.1 % — ABNORMAL LOW (ref 36.0–46.0)
Hemoglobin: 7.1 g/dL — ABNORMAL LOW (ref 12.0–15.0)
MCH: 25.9 pg — ABNORMAL LOW (ref 26.0–34.0)
MCHC: 30.7 g/dL (ref 30.0–36.0)
MCV: 84.3 fL (ref 78.0–100.0)
Platelets: 75 10*3/uL — ABNORMAL LOW (ref 150–400)
RBC: 2.74 MIL/uL — AB (ref 3.87–5.11)
RDW: 20.5 % — ABNORMAL HIGH (ref 11.5–15.5)
WBC: 3.5 10*3/uL — ABNORMAL LOW (ref 4.0–10.5)

## 2013-06-12 LAB — I-STAT TROPONIN, ED: Troponin i, poc: 0.02 ng/mL (ref 0.00–0.08)

## 2013-06-12 LAB — RENAL FUNCTION PANEL
Albumin: 3.5 g/dL (ref 3.5–5.2)
BUN: 66 mg/dL — ABNORMAL HIGH (ref 6–23)
CALCIUM: 7.3 mg/dL — AB (ref 8.4–10.5)
CO2: 14 mEq/L — ABNORMAL LOW (ref 19–32)
Chloride: 109 mEq/L (ref 96–112)
Creatinine, Ser: 4.41 mg/dL — ABNORMAL HIGH (ref 0.50–1.10)
GFR calc non Af Amer: 10 mL/min — ABNORMAL LOW (ref >90)
GFR, EST AFRICAN AMERICAN: 12 mL/min — AB (ref >90)
GLUCOSE: 95 mg/dL (ref 70–99)
POTASSIUM: 3.6 meq/L — AB (ref 3.7–5.3)
Phosphorus: 1.5 mg/dL — ABNORMAL LOW (ref 2.3–4.6)
SODIUM: 143 meq/L (ref 137–147)

## 2013-06-12 LAB — FERRITIN: Ferritin: 489 ng/mL — ABNORMAL HIGH (ref 10–291)

## 2013-06-12 LAB — PREPARE RBC (CROSSMATCH)

## 2013-06-12 LAB — RETICULOCYTES
RBC.: 2.85 MIL/uL — AB (ref 3.87–5.11)
RETIC COUNT ABSOLUTE: 11.4 10*3/uL — AB (ref 19.0–186.0)
Retic Ct Pct: 0.4 % (ref 0.4–3.1)

## 2013-06-12 LAB — FOLATE: FOLATE: 8.4 ng/mL

## 2013-06-12 LAB — POC OCCULT BLOOD, ED: FECAL OCCULT BLD: NEGATIVE

## 2013-06-12 LAB — VITAMIN B12: Vitamin B-12: 734 pg/mL (ref 211–911)

## 2013-06-12 LAB — MAGNESIUM: Magnesium: 0.9 mg/dL — CL (ref 1.5–2.5)

## 2013-06-12 MED ORDER — TACROLIMUS 1 MG PO CAPS
2.0000 mg | ORAL_CAPSULE | Freq: Two times a day (BID) | ORAL | Status: DC
  Administered 2013-06-12 – 2013-06-13 (×2): 2 mg via ORAL
  Filled 2013-06-12 (×3): qty 2

## 2013-06-12 MED ORDER — SODIUM CHLORIDE 0.9 % IJ SOLN: 3.0000 mL | INTRAMUSCULAR | Status: DC | PRN

## 2013-06-12 MED ORDER — SODIUM CHLORIDE 0.9 % IV BOLUS (SEPSIS)
500.0000 mL | Freq: Once | INTRAVENOUS | Status: AC
  Administered 2013-06-12: 500 mL via INTRAVENOUS

## 2013-06-12 MED ORDER — ADULT MULTIVITAMIN W/MINERALS CH
1.0000 | ORAL_TABLET | Freq: Every day | ORAL | Status: DC
  Administered 2013-06-13 – 2013-06-20 (×8): 1 via ORAL
  Filled 2013-06-12 (×8): qty 1

## 2013-06-12 MED ORDER — SODIUM CHLORIDE 0.9 % IV SOLN
250.0000 mL | INTRAVENOUS | Status: DC | PRN
  Administered 2013-06-15: 250 mL via INTRAVENOUS

## 2013-06-12 MED ORDER — HYDROCODONE-ACETAMINOPHEN 5-325 MG PO TABS
1.0000 | ORAL_TABLET | ORAL | Status: DC | PRN
  Administered 2013-06-12: 1 via ORAL
  Administered 2013-06-13 – 2013-06-14 (×2): 2 via ORAL
  Administered 2013-06-14 – 2013-06-19 (×2): 1 via ORAL
  Filled 2013-06-12 (×2): qty 1
  Filled 2013-06-12: qty 2
  Filled 2013-06-12: qty 1
  Filled 2013-06-12: qty 2

## 2013-06-12 MED ORDER — MYCOPHENOLATE MOFETIL 250 MG PO CAPS
500.0000 mg | ORAL_CAPSULE | Freq: Two times a day (BID) | ORAL | Status: DC
  Administered 2013-06-12 – 2013-06-13 (×2): 500 mg via ORAL
  Filled 2013-06-12 (×3): qty 2

## 2013-06-12 MED ORDER — CALCITRIOL 0.5 MCG PO CAPS
0.5000 ug | ORAL_CAPSULE | Freq: Every day | ORAL | Status: DC
  Administered 2013-06-13 – 2013-06-14 (×2): 0.5 ug via ORAL
  Filled 2013-06-12 (×3): qty 1

## 2013-06-12 MED ORDER — CALCIUM CARBONATE ANTACID 500 MG PO CHEW
2.0000 | CHEWABLE_TABLET | Freq: Three times a day (TID) | ORAL | Status: DC
  Administered 2013-06-12 – 2013-06-20 (×24): 400 mg via ORAL
  Filled 2013-06-12 (×25): qty 2

## 2013-06-12 MED ORDER — POTASSIUM CHLORIDE CRYS ER 20 MEQ PO TBCR
40.0000 meq | EXTENDED_RELEASE_TABLET | Freq: Every day | ORAL | Status: DC
  Administered 2013-06-13 – 2013-06-20 (×8): 40 meq via ORAL
  Filled 2013-06-12 (×8): qty 2

## 2013-06-12 MED ORDER — ACETAMINOPHEN 325 MG PO TABS: 650.0000 mg | ORAL_TABLET | Freq: Once | ORAL | Status: DC

## 2013-06-12 MED ORDER — EPOETIN ALFA 20000 UNIT/ML IJ SOLN: 50000.0000 [IU] | INTRAMUSCULAR | Status: DC

## 2013-06-12 MED ORDER — OXYCODONE-ACETAMINOPHEN 5-325 MG PO TABS
1.0000 | ORAL_TABLET | Freq: Once | ORAL | Status: AC
  Administered 2013-06-12: 1 via ORAL
  Filled 2013-06-12: qty 1

## 2013-06-12 MED ORDER — BOOST PO LIQD
237.0000 mL | Freq: Three times a day (TID) | ORAL | Status: DC
  Administered 2013-06-12 – 2013-06-13 (×2): 237 mL via ORAL
  Filled 2013-06-12 (×6): qty 237

## 2013-06-12 MED ORDER — EPOETIN ALFA 10000 UNIT/ML IJ SOLN
INTRAMUSCULAR | Status: AC
  Administered 2013-06-12: 10000 [IU] via SUBCUTANEOUS
  Filled 2013-06-12: qty 1

## 2013-06-12 MED ORDER — MAGNESIUM OXIDE 400 MG PO TABS
400.0000 mg | ORAL_TABLET | Freq: Three times a day (TID) | ORAL | Status: DC
  Administered 2013-06-12 – 2013-06-20 (×24): 400 mg via ORAL
  Filled 2013-06-12 (×25): qty 1

## 2013-06-12 MED ORDER — HEPARIN SODIUM (PORCINE) 5000 UNIT/ML IJ SOLN
5000.0000 [IU] | Freq: Three times a day (TID) | INTRAMUSCULAR | Status: DC
  Filled 2013-06-12 (×2): qty 1

## 2013-06-12 MED ORDER — EPOETIN ALFA 40000 UNIT/ML IJ SOLN
INTRAMUSCULAR | Status: AC
  Administered 2013-06-12: 40000 [IU] via SUBCUTANEOUS
  Filled 2013-06-12: qty 1

## 2013-06-12 MED ORDER — SODIUM BICARBONATE 650 MG PO TABS
1300.0000 mg | ORAL_TABLET | Freq: Three times a day (TID) | ORAL | Status: DC
  Administered 2013-06-12 – 2013-06-20 (×24): 1300 mg via ORAL
  Filled 2013-06-12 (×25): qty 2

## 2013-06-12 MED ORDER — SODIUM CHLORIDE 0.9 % IJ SOLN
3.0000 mL | Freq: Two times a day (BID) | INTRAMUSCULAR | Status: DC
  Administered 2013-06-12 – 2013-06-16 (×6): 3 mL via INTRAVENOUS
  Administered 2013-06-18: 10:00:00 via INTRAVENOUS
  Administered 2013-06-20: 3 mL via INTRAVENOUS

## 2013-06-12 NOTE — ED Notes (Signed)
Pt returned from radiology.

## 2013-06-12 NOTE — Progress Notes (Signed)
Patient presented c/o pain inside when she takes a deep breath. Hemocue 7.1. Dr. Serita Grit office called. Orders received. Type and cross match drawn and sent. Patient to go to ED. Patient aware. Denies bloody stools, SOB. Reports that she "just doesn't feel well".

## 2013-06-12 NOTE — ED Provider Notes (Signed)
CSN: [id number]     Arrival date & time 06/12/13  1516 History   First MD Initiated Contact with Patient 06/12/13 1534     Chief Complaint  Patient presents with  . Weakness  . Diarrhea     (Consider location/radiation/quality/duration/timing/severity/associated sxs/prior Treatment) Patient is a 53 y.o. female presenting with weakness. The history is provided by the patient.  Weakness This is a chronic problem. The current episode started more than 1 week ago. The problem occurs constantly. The problem has been gradually worsening. Pertinent negatives include no chest pain, no abdominal pain, no headaches and no shortness of breath. Nothing aggravates the symptoms. Nothing relieves the symptoms. She has tried nothing for the symptoms. The treatment provided no relief.    Past Medical History  Diagnosis Date  . Chronic kidney disease   . Anemia   . Stroke   . Hypertension   . Hypercalcemia   . Hypomagnesemia    Past Surgical History  Procedure Laterality Date  . Kidney transplant     No family history on file. History  Substance Use Topics  . Smoking status: Never Smoker   . Smokeless tobacco: Never Used  . Alcohol Use: No   OB History   Grav Para Term Preterm Abortions TAB SAB Ect Mult Living                 Review of Systems  Constitutional: Negative for fever and fatigue.  HENT: Negative for congestion and drooling.   Eyes: Negative for pain.  Respiratory: Negative for cough and shortness of breath.   Cardiovascular: Negative for chest pain.  Gastrointestinal: Positive for diarrhea. Negative for nausea, vomiting and abdominal pain.  Genitourinary: Negative for dysuria and hematuria.  Musculoskeletal: Negative for back pain, gait problem and neck pain.  Skin: Negative for color change.  Neurological: Positive for weakness (generalized). Negative for dizziness and headaches.  Hematological: Negative for adenopathy.  Psychiatric/Behavioral: Negative for behavioral  problems.  All other systems reviewed and are negative.     Allergies  Cephalosporins; Erythromycin; and Fexofenadine-pseudoephed er  Home Medications   Prior to Admission medications   Medication Sig Start Date End Date Taking? Authorizing Provider  acetaminophen (TYLENOL) 325 MG tablet Take 325 mg by mouth as needed.    Historical Provider, MD  calcitRIOL (ROCALTROL) 0.25 MCG capsule Take 1 capsule by mouth daily. 11/16/12 11/16/13  Historical Provider, MD  calcium carbonate (HEALTHY MAMA TAME THE FLAME) 500 MG chewable tablet Chew 500 mg by mouth 3 (three) times daily. 11/16/12 11/16/13  Historical Provider, MD  lactose free nutrition (BOOST) LIQD Take 237 mLs by mouth 3 (three) times daily between meals.    Historical Provider, MD  magnesium oxide (MAG-OX) 400 MG tablet Take 400 mg by mouth daily. 11/16/12 11/16/13  Historical Provider, MD  Multiple Vitamin (MULTI-VITAMINS) TABS Take 1 tablet by mouth daily.    Historical Provider, MD  mycophenolate (CELLCEPT) 500 MG tablet Take 500 mg by mouth 2 (two) times daily.    Historical Provider, MD  Potassium Chloride ER 20 MEQ TBCR Take 2 tablets by mouth daily.  11/16/12 11/16/13  Historical Provider, MD  sodium bicarbonate 325 MG tablet Take 1,300 mg by mouth daily.    Historical Provider, MD  tacrolimus (PROGRAF) 1 MG capsule Take 1 mg by mouth 2 (two) times daily.    Historical Provider, MD   BP 90/60  Pulse 79  Temp(Src) 97.6 F (36.4 C) (Oral)  Resp 16  SpO2 100% Physical Exam  Nursing note and vitals reviewed. Constitutional: She is oriented to person, place, and time. She appears well-developed and well-nourished.  Thin   HENT:  Head: Normocephalic.  Mouth/Throat: Oropharynx is clear and moist. No oropharyngeal exudate.  Eyes: Conjunctivae and EOM are normal. Pupils are equal, round, and reactive to light.  Neck: Normal range of motion. Neck supple.  Cardiovascular: Normal rate, regular rhythm, normal heart sounds and intact  distal pulses.  Exam reveals no gallop and no friction rub.   No murmur heard. Pulmonary/Chest: Effort normal and breath sounds normal. No respiratory distress. She has no wheezes. She exhibits tenderness (Bilateral lower lateral rib tenderness to palpation.).  Abdominal: Soft. Bowel sounds are normal. There is tenderness (diffuse non-specific tenderness to palpation of the abdomen.). There is no rebound and no guarding.  Musculoskeletal: Normal range of motion. She exhibits no edema and no tenderness.  Neurological: She is alert and oriented to person, place, and time.  Skin: Skin is warm and dry.  Psychiatric: She has a normal mood and affect. Her behavior is normal.    ED Course  Procedures (including critical care time) Labs Review Labs Reviewed  CBC - Abnormal; Notable for the following:    WBC 3.5 (*)    RBC 2.74 (*)    Hemoglobin 7.1 (*)    HCT 23.1 (*)    MCH 25.9 (*)    RDW 20.5 (*)    Platelets 75 (*)    All other components within normal limits  BASIC METABOLIC PANEL - Abnormal; Notable for the following:    CO2 14 (*)    BUN 66 (*)    Creatinine, Ser 4.29 (*)    Calcium 7.4 (*)    GFR calc non Af Amer 11 (*)    GFR calc Af Amer 12 (*)    All other components within normal limits  IRON AND TIBC - Abnormal; Notable for the following:    Iron 180 (*)    UIBC <15 (*)    All other components within normal limits  FERRITIN - Abnormal; Notable for the following:    Ferritin 489 (*)    All other components within normal limits  RETICULOCYTES - Abnormal; Notable for the following:    RBC. 2.85 (*)    Retic Count, Manual 11.4 (*)    All other components within normal limits  MAGNESIUM - Abnormal; Notable for the following:    Magnesium 0.9 (*)    All other components within normal limits  COMPREHENSIVE METABOLIC PANEL - Abnormal; Notable for the following:    Potassium 3.5 (*)    CO2 15 (*)    BUN 65 (*)    Creatinine, Ser 4.24 (*)    Calcium 7.0 (*)    Total  Protein 5.0 (*)    Albumin 2.9 (*)    Alkaline Phosphatase 383 (*)    GFR calc non Af Amer 11 (*)    GFR calc Af Amer 13 (*)    All other components within normal limits  CBC - Abnormal; Notable for the following:    WBC 3.1 (*)    RBC 2.27 (*)    Hemoglobin 5.9 (*)    HCT 19.0 (*)    RDW 20.6 (*)    Platelets 64 (*)    All other components within normal limits  MAGNESIUM - Abnormal; Notable for the following:    Magnesium 0.8 (*)    All other components within normal limits  PROTIME-INR - Abnormal; Notable for the  following:    Prothrombin Time 16.6 (*)    All other components within normal limits  LACTATE DEHYDROGENASE - Abnormal; Notable for the following:    LDH 265 (*)    All other components within normal limits  D-DIMER, QUANTITATIVE - Abnormal; Notable for the following:    D-Dimer, Quant 5.09 (*)    All other components within normal limits  VITAMIN B12  FOLATE  TECHNOLOGIST SMEAR REVIEW  APTT  FIBRINOGEN  HAPTOGLOBIN  HIV ANTIBODY (ROUTINE TESTING)  I-STAT TROPOININ, ED  POC OCCULT BLOOD, ED  TYPE AND SCREEN  PREPARE RBC (CROSSMATCH)    Imaging Review Dg Chest 2 View  06/12/2013   CLINICAL DATA:  Weakness, diarrhea and right-sided chest pain. History of stroke and chronic kidney disease.  EXAM: CHEST - 2 VIEW  COMPARISON:  DG CHEST 1V PORT dated 09/03/2010  FINDINGS: The heart is mildly enlarged. There is no evidence of pulmonary edema, consolidation, pneumothorax, nodule or pleural fluid. Lateral projection shows a watch diet appearance of the thoracic vertebral bodies suggestive of renal osteodystrophy. No fractures are identified.  IMPRESSION: No acute findings. 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   Electronically Signed   By: Aletta Edouard M.D.   On: 06/12/2013 17:31     EKG Interpretation None      Date: 06/12/2013  Rate: 85  Rhythm: normal sinus rhythm  QRS Axis: normal  Intervals: normal  ST/T Wave abnormalities: normal   Conduction Disutrbances:none  Narrative Interpretation: No ST or T wave changes consistent with ischemia.    Old EKG Reviewed: unchanged   MDM   Final diagnoses:  Anemia     4:15 PM 53 y.o. female w/ history of renal transplant in 2005 who presents from short stay up stairs due to low hemoglobin. The patient reports that her hemoglobin is 7.1. The last lab work I see in the system is a hemoglobin of 7.6 approximately one week ago. She states that she has chronic generalized weakness which has been slightly worse recently. She also notes chronic intermittent diarrhea which has been worse over the last few days. She also has chronic joint pains. She is complaining of some bilateral lower lateral rib pain which has been new over the last 1-2 weeks. She is afebrile and mildly hypotensive here. Will get screening labwork. Will give 500 cc bolus. Will Hemoccult.  Discussed w/ on call physician at [facility] who thinks blood transfusion is reasonable to perform given pt's weakness and consistently dropping Hgb. Will admit to [facility] medicine.   Blanchard Kelch, MD 06/13/13 1023

## 2013-06-12 NOTE — ED Notes (Addendum)
Pt states she was at short stay to receive a blood transfusion but was feeling bad so she was sent to ED.  C/o generalized weakness x 2-3 days, nausea, and "lung pain."  Also reports intermittent episodes of diarrhea x 1 year that causes electrolyte imbalances.  Denies sob.

## 2013-06-12 NOTE — Progress Notes (Signed)
Patient sent to ED via wheelchair, accompanied by NT. Report called to charge nurse.

## 2013-06-12 NOTE — ED Notes (Signed)
Pt reports she goes to short stay on a weekly basis to get her procrit injection and went ahead and had that injected prior to coming down to the ED. Pt sts the physician up there thought she needed to have a transfusion so he sent her down to the ED. Pt c/o pain to various joints, sts she thinks it is arthritis. Pt has kidney disease and has had a kidney transplant at [facility]. Pt reports recent decrease in weight but no explanation. sts that she has tried to investigate it with her PCP and other doctors but never given a reason for her weight loss and low Hgb. Pt sts she feels weaker today than normally. Nad, skin warm and dry, resp e/u.

## 2013-06-12 NOTE — ED Notes (Signed)
Meal Tray delivered to pt.

## 2013-06-12 NOTE — ED Notes (Addendum)
Spoke with blood bank. Pt has blood bank bracelet on but the blood bank never actually received a sample to the lab. Short stay placed the bracelet on the pt prior to transporting the pt down here. Phlebotomy has been called to collect a sample.

## 2013-06-12 NOTE — Progress Notes (Signed)
CRITICAL VALUE ALERT  Critical value received:  Magnesium 0.9 mg/dL   Date of notification:  06/12/2013   Time of notification:  23:01   Critical value read back:yes  Nurse who received alert:  Yaniv Lage [place] Mavric Cortright, RN  MD notified (1st page):  Family Medicine, Resident Sheral Apley, MD  Time of first page: 23:55

## 2013-06-12 NOTE — ED Notes (Signed)
Admitting at bedside.   Attempted report.

## 2013-06-12 NOTE — ED Notes (Signed)
Pharmacy tech at bedside 

## 2013-06-12 NOTE — ED Notes (Signed)
Pt taken to xray 

## 2013-06-12 NOTE — H&P (Signed)
[facility] Admission History and Physical Service Pager: [phone]  Patient name: [name] Medical record number: [id number] Date of birth: 11/18/1960 Age: 53 y.o. Gender: female  Primary Care Provider: Ulla Potash., MD Consultants: None Code Status: Full  Chief Complaint: Anemia  Assessment and Plan: [name] is a 53 y.o. female presenting with anemia . 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   # Anemia: Normocytic  - Chronic (likely secondary to CKD and anti-rejection medications) Hgb trending down from 8.5 (05/27/13); However concerning for additional subacute process. Reports baseline (~9-10) until September; Reports ~40 lbs wt loss since 9/14; FOBT (-); Never had colonoscopy; past due for mammogram - Retic Production Index = 0.08; very inadequate response  - Iron and ferritin elevated w/ low UIBC - Considering transfusion, but not acutely symptomatic (no tachycardia, CP, or dyspnea) other than fatigue/weakness, and do not want to promote Alloimmunization in renal transplant patient who is currently being evaluated for second kidney transplant - Will hold off on transfusion tonight and call her Nephrologist and Transplant doctor at [facility] tomorrow to discuss transfusion and get recommendations unless she becomes acutely symptomatic - PT/OT to eval/treat for weakness  #CKD s/p renal transplant - Renal fcn appears at baseline currently; Cr 4.29 - No electrolyte abnormalities; will continue to monitor  - Continue anti-rejection medications  # Weight loss - Reports ~40 lbs wt loss since September along with intermittent loose stools, worsening anemia (see above) and rib pain. Never had colonoscopy and is past due for Mammogram. Denies any changes to her renal medications  - Symptoms potentially related to worsening renal fcn, but concerning for malignancy as well - Currently w/o PCP  other than nephrologist - Will discuss with team in morning and likely recommend establishing PCP for ongoing evaluation, symptom management and completing cancer screening recommendations  # Thrombocytopenia - Plts 75; No baseline for comparison - Likely due to CKD and anti-rejection medications, but viral etiology possible - Avoid use of heparin if possible  # Stroke & Seizures - Reports stroke and subsequent seizures w/o residual deficits after episode of hypotension  - No symptoms, seizures or medications since   FEN/GI: SLIV; Diet renal Prophylaxis: SCDs  Disposition: Admit for observation; anticipate discharge home pending hgb remaining stable and nephrology recommendations  History of Present Illness: [name] is a 53 y.o. female presenting with anemia. She reports being sent from her Short stay visit for Procrit injection when it was noticed that her Hgb was 7.1. She denies any SOB, dizziness, or substernal CP. She reports chronic anemia with hgb ~ 9-10 due to her renal disease but reports worsening anemia starting in September, along with intermittent episodes of loose stools, weight loss ~ 40lb, and new lateral sharp rib pain bilaterally. 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 She says she is current being evaluated by [facility] for another renal transplant. She denies any abdominal pain, melena or hematochezia.   Review Of Systems: Per HPI with the following additions:  Otherwise 12 point review of systems was performed and was unremarkable.  Patient Active Problem List   Diagnosis Date Noted  . Anemia 06/12/2013   Past Medical History: Past Medical History  Diagnosis Date  . Chronic kidney disease   . Anemia   . Stroke   . Hypertension   . Hypercalcemia   . Hypomagnesemia  Past Surgical History: Past Surgical History  Procedure Laterality Date  . Kidney transplant     Social History: History   Substance Use Topics  . Smoking status: Never Smoker   . Smokeless tobacco: Never Used  . Alcohol Use: No   Additional social history:  Please also refer to relevant sections of EMR.  Family History: No family history on file. Allergies and Medications: Allergies  Allergen Reactions  . Cephalosporins Diarrhea and Nausea Only  . Erythromycin Diarrhea and Nausea Only  . Fexofenadine-Pseudoephed Er Other (See Comments)    dizzy   Current Facility-Administered Medications on File Prior to Encounter  Medication Dose Route Frequency Provider Last Rate Last Dose  . epoetin alfa (EPOGEN,PROCRIT) injection 50,000 Units  50,000 Units Subcutaneous Q7 days Clayborne Dana. Posey Pronto, MD       Current Outpatient Prescriptions on File Prior to Encounter  Medication Sig Dispense Refill  . calcium carbonate (HEALTHY MAMA TAME THE FLAME) 500 MG chewable tablet Chew 2 tablets by mouth 3 (three) times daily.       Marland Kitchen lactose free nutrition (BOOST) LIQD Take 237 mLs by mouth 3 (three) times daily between meals.      . magnesium oxide (MAG-OX) 400 MG tablet Take 400 mg by mouth 3 (three) times daily.       . Multiple Vitamin (MULTI-VITAMINS) TABS Take 1 tablet by mouth daily.      . mycophenolate (CELLCEPT) 500 MG tablet Take 500 mg by mouth 2 (two) times daily.      . Potassium Chloride ER 20 MEQ TBCR Take 2 tablets by mouth daily.       . sodium bicarbonate 325 MG tablet Take 1,300 mg by mouth 3 (three) times daily.       . tacrolimus (PROGRAF) 1 MG capsule Take 2 mg by mouth 2 (two) times daily.         Objective: BP 109/64  Pulse 79  Temp(Src) 97.6 F (36.4 C) (Oral)  Resp 16  SpO2 93% Exam: General: Thin frail Female; NAD. Appears fatigued HEENT: MMM, No cervical adenopathy Cardiovascular: RRR, no m/r/g; Cap refill < 3 secs Respiratory: CTAB, normal resp effort Abdomen: SNTND; BS+; No masses, RUQ hernia at site of previous incision Extremities: LE cool but pulses 2+; No edema MSK: Lateral lower  rib tenderness b/l   Labs and Imaging: CBC BMET   Recent Labs Lab 06/12/13 1529  WBC 3.5*  HGB 7.1*  HCT 23.1*  PLT 75*    Recent Labs Lab 06/12/13 1529  NA 143  K 4.0  CL 112  CO2 14*  BUN 66*  CREATININE 4.29*  GLUCOSE 92  CALCIUM 7.4*     Iron/TIBC/Ferritin    Component Value Date/Time   IRON 180* 06/12/2013 1618   TIBC NOT CALC 06/12/2013 1618   FERRITIN 444* 06/03/2013 1526   FOBT: negative  CXR FINDINGS: 06/12/13 The heart is mildly enlarged. There is no evidence of pulmonary  edema, consolidation, pneumothorax, nodule or pleural fluid. Lateral  projection shows a watch diet appearance of the thoracic vertebral  bodies suggestive of renal osteodystrophy. No fractures are  identified.  IMPRESSION:  No acute findings. 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    Phill Myron, MD 06/12/2013, 8:07 PM PGY-1, [place] Intern pager: [phone], text pages welcome  PGY-3 Addendum: I agree with Dr. Milagros Reap note and assessment above. Since she is stable, we will hold on transfusion until we get the go ahead  from her transplant team. If they agree with blood transfusion, she will need leukoreduced RBC transfused tomorrow. Monitor for acute worsening of her symptoms. She will also need further work up for her cause of anemia and co-existing conditions at time of admission. Most likely to be addressed as an outpatient.  Kayti Poss M. Alante Weimann, M.D. 06/12/2013 10:55 PM

## 2013-06-13 ENCOUNTER — Encounter (HOSPITAL_COMMUNITY): Payer: Self-pay | Admitting: Nephrology

## 2013-06-13 ENCOUNTER — Encounter (HOSPITAL_COMMUNITY): Payer: Medicare Other

## 2013-06-13 DIAGNOSIS — T8611 Kidney transplant rejection: Secondary | ICD-10-CM | POA: Diagnosis present

## 2013-06-13 DIAGNOSIS — T8612 Kidney transplant failure: Secondary | ICD-10-CM

## 2013-06-13 DIAGNOSIS — E46 Unspecified protein-calorie malnutrition: Secondary | ICD-10-CM

## 2013-06-13 DIAGNOSIS — Z940 Kidney transplant status: Secondary | ICD-10-CM

## 2013-06-13 DIAGNOSIS — T451X5A Adverse effect of antineoplastic and immunosuppressive drugs, initial encounter: Secondary | ICD-10-CM | POA: Diagnosis present

## 2013-06-13 DIAGNOSIS — D696 Thrombocytopenia, unspecified: Secondary | ICD-10-CM

## 2013-06-13 DIAGNOSIS — E43 Unspecified severe protein-calorie malnutrition: Secondary | ICD-10-CM | POA: Diagnosis present

## 2013-06-13 DIAGNOSIS — N184 Chronic kidney disease, stage 4 (severe): Secondary | ICD-10-CM | POA: Diagnosis present

## 2013-06-13 LAB — D-DIMER, QUANTITATIVE: D-Dimer, Quant: 5.09 ug/mL-FEU — ABNORMAL HIGH (ref 0.00–0.48)

## 2013-06-13 LAB — COMPREHENSIVE METABOLIC PANEL
ALT: 7 U/L (ref 0–35)
AST: 12 U/L (ref 0–37)
Albumin: 2.9 g/dL — ABNORMAL LOW (ref 3.5–5.2)
Alkaline Phosphatase: 383 U/L — ABNORMAL HIGH (ref 39–117)
BUN: 65 mg/dL — ABNORMAL HIGH (ref 6–23)
CALCIUM: 7 mg/dL — AB (ref 8.4–10.5)
CO2: 15 meq/L — AB (ref 19–32)
Chloride: 109 mEq/L (ref 96–112)
Creatinine, Ser: 4.24 mg/dL — ABNORMAL HIGH (ref 0.50–1.10)
GFR, EST AFRICAN AMERICAN: 13 mL/min — AB (ref >90)
GFR, EST NON AFRICAN AMERICAN: 11 mL/min — AB (ref >90)
GLUCOSE: 88 mg/dL (ref 70–99)
Potassium: 3.5 mEq/L — ABNORMAL LOW (ref 3.7–5.3)
SODIUM: 143 meq/L (ref 137–147)
TOTAL PROTEIN: 5 g/dL — AB (ref 6.0–8.3)
Total Bilirubin: 0.3 mg/dL (ref 0.3–1.2)

## 2013-06-13 LAB — CBC
HEMATOCRIT: 19 % — AB (ref 36.0–46.0)
HEMOGLOBIN: 5.9 g/dL — AB (ref 12.0–15.0)
MCH: 26 pg (ref 26.0–34.0)
MCHC: 31.1 g/dL (ref 30.0–36.0)
MCV: 83.7 fL (ref 78.0–100.0)
Platelets: 64 10*3/uL — ABNORMAL LOW (ref 150–400)
RBC: 2.27 MIL/uL — AB (ref 3.87–5.11)
RDW: 20.6 % — ABNORMAL HIGH (ref 11.5–15.5)
WBC: 3.1 10*3/uL — ABNORMAL LOW (ref 4.0–10.5)

## 2013-06-13 LAB — FIBRINOGEN: Fibrinogen: 229 mg/dL (ref 204–475)

## 2013-06-13 LAB — APTT: aPTT: 29 seconds (ref 24–37)

## 2013-06-13 LAB — LACTATE DEHYDROGENASE: LDH: 265 U/L — ABNORMAL HIGH (ref 94–250)

## 2013-06-13 LAB — MAGNESIUM
MAGNESIUM: 0.8 mg/dL — AB (ref 1.5–2.5)
Magnesium: 2.1 mg/dL (ref 1.5–2.5)

## 2013-06-13 LAB — PROTIME-INR
INR: 1.38 (ref 0.00–1.49)
Prothrombin Time: 16.6 seconds — ABNORMAL HIGH (ref 11.6–15.2)

## 2013-06-13 LAB — HIV ANTIBODY (ROUTINE TESTING W REFLEX): HIV: NONREACTIVE

## 2013-06-13 LAB — PREPARE RBC (CROSSMATCH)

## 2013-06-13 LAB — TECHNOLOGIST SMEAR REVIEW

## 2013-06-13 LAB — POCT HEMOGLOBIN-HEMACUE: HEMOGLOBIN: 7.1 g/dL — AB (ref 12.0–15.0)

## 2013-06-13 LAB — HAPTOGLOBIN: Haptoglobin: 138 mg/dL (ref 45–215)

## 2013-06-13 MED ORDER — SODIUM BICARBONATE 650 MG PO TABS: 1300.0000 mg | ORAL_TABLET | Freq: Two times a day (BID) | ORAL | Status: DC

## 2013-06-13 MED ORDER — BOOST / RESOURCE BREEZE PO LIQD
1.0000 | Freq: Three times a day (TID) | ORAL | Status: DC
  Administered 2013-06-13 – 2013-06-17 (×6): 1 via ORAL

## 2013-06-13 MED ORDER — MAGNESIUM SULFATE 4000MG/100ML IJ SOLN
4.0000 g | Freq: Once | INTRAMUSCULAR | Status: AC
  Administered 2013-06-13: 4 g via INTRAVENOUS
  Filled 2013-06-13 (×2): qty 100

## 2013-06-13 MED ORDER — TACROLIMUS 1 MG PO CAPS
1.0000 mg | ORAL_CAPSULE | Freq: Two times a day (BID) | ORAL | Status: DC
  Administered 2013-06-13 – 2013-06-20 (×14): 1 mg via ORAL
  Filled 2013-06-13 (×15): qty 1

## 2013-06-13 MED ORDER — MYCOPHENOLATE MOFETIL 250 MG PO CAPS
250.0000 mg | ORAL_CAPSULE | Freq: Two times a day (BID) | ORAL | Status: DC
  Administered 2013-06-13 – 2013-06-20 (×14): 250 mg via ORAL
  Filled 2013-06-13 (×15): qty 1

## 2013-06-13 MED ORDER — POTASSIUM CHLORIDE 20 MEQ/15ML (10%) PO LIQD: 20.0000 meq | Freq: Every day | ORAL | Status: DC

## 2013-06-13 NOTE — Discharge Summary (Signed)
[facility] Discharge Summary  Patient name: [name] Medical record number: [id number] Date of birth: [birth year] Age: 53 y.o. Gender: female Date of Admission: 06/12/2013  Date of Discharge: 06/20/13 Admitting Physician: Willeen Niece, MD  Primary Care Provider: Ulla Potash., MD Consultants: renal, hematology, palliative care  Indication for Hospitalization: Pancytopenia  Discharge Diagnoses/Problem List:  Pancytopenia DVT CKD s/p renal transplant Protein-calorie malnutrition Hypotension Hypomagnesemia  Hx of stroke Hx of seizure  Disposition: CIR  Discharge Condition: Stable  Brief Hospital Course:  [name] is a 54 y.o. female who presented with weakness and noted to be pancytopenia. PMH is significant for CKD status post cadaveric kidney transplant nine years ago in [place] 01/14/04 due to congenitally atrophic/pelvic kidneys, stroke and seizure. She presented from short stay after noting that her Hgb has dropped from 10 g/dL on 3/15 down to 7.6 at time of admission. She also reported 9 month h/o anorexia and 40 lb weight loss. Her Hgb dropped to 5.9 and she was transfused 1 unit. Nephrology and Hematology was consulted. Her Cellcept and Prograf were reduced, and she received another 1 unit of RBCs. Due to positive D-dimer on admission, LE dopplers were performed and DVT found. She was started on heparin and bridge to coumadin. Her pancytopenia improved with reduction on her anti-rejection medications. She was evaluated by PT/OT, and transferred to CIR for on going therapy and continue management of her medical needs.   # Pancytopenia: Likely chronic (likely secondary to CKD and anti-rejection medications). However concerning for additional subacute process as well, possible CA or viral etiology as she reports ~40 lbs wt loss since 11/11/13 and has history of CMV infection. Nephrology was consulted reduced Cellcept and Prograf. EBV, CMV, HIV and  Quatiferon gold were all negative. Hematology was consulted and work-up negative for MGUS or multiple myeloma. Skieletal survey done on 09/03/2010 was negative. She was advised to follow-up with previously recommended cancer screenings: Colonoscopy, Mammogram, Pap smear.   # ESRD: Status post cadaveric kidney transplant in 2005. Currently being managed by [facility]. She is not currently on dialysis and is unsure if she would go on dialysis again. She is currently being evaluated for second renal transplant, but also wants to "get affairs in order." Palliative care was consulted and was given an advanced directive packet to fill out and most form. She stated she had been talking about surrogate decision makers with her mom because she is concerned that her son may not be best person for that role.   # DVT: Ddimer 5.09 on admission. LE dopplers showed DVT in the saphenofemoral junction that extends into the common femoral vein. Raises concern for malignancy in setting of thrombocytopenia; However could be iatrogenic as near site of previous PICC line. Heparin-Coumadin bridge.   # Protein malnutrition- increase supplementation and appreciate Nutritionist eval/recs  # SHPTH/renal osteodystrophy- likely source of rib pain, increased Alk phos and LDH. CXR c/w renal osteodystrophy changes. Continue with vit D. Vicodin as needed  # Metabolic acidosis- bicarb po and follow  # Hx of Stroke & Seizures: Reports stroke and subsequent seizures w/o residual deficits after episode of hypotension. No symptoms, seizures or medications since.    Issues for Follow Up:   Thrombocytopenia on heparin and coumadin for DVT treatment  Monitor anemia - transfuse as needed but judiciously as candidate for second renal transplant  Out patient CA screening: Colonoscopy, Mammogram, and Pap  Significant Procedures: LE duplex  Significant Labs and Imaging:  Recent Labs Lab 06/18/13 0232 06/19/13 0610 06/20/13 0620   WBC 4.2 4.9 4.1  HGB 7.4* 7.4* 7.0*  HCT 23.9* 22.8* 22.1*  PLT 64* 91* 105*    Recent Labs Lab 06/16/13 0305 06/17/13 0250 06/18/13 0232 06/19/13 0610 06/20/13 0620  NA 137 139 142 140 140  K 4.9 4.5 4.5 4.8 5.3  CL 109 110 112 110 111  CO2 15* 16* 18* 16* 18*  GLUCOSE 84 88 85 78 81  BUN 50* 52* 52* 53* 52*  CREATININE 3.47* 3.53* 3.51* 3.52* 3.44*  CALCIUM 7.9* 7.4* 7.6* 7.7* 7.6*  MG 1.7 1.7 1.6 1.7 1.6    Recent Labs Lab 06/17/13 1443 06/18/13 0232 06/19/13 0610 06/20/13 0620  INR 1.16 1.28 1.69* 2.20*   Results/Tests Pending at Time of Discharge: none  Discharge Medications:    Medication List    STOP taking these medications       acetaminophen 500 MG tablet  Commonly known as:  TYLENOL  Replaced by:  acetaminophen 160 MG/5ML solution     mycophenolate 500 MG tablet  Commonly known as:  CELLCEPT  Replaced by:  mycophenolate 250 MG capsule     OVER THE COUNTER MEDICATION     Potassium Chloride ER 20 MEQ Tbcr  Replaced by:  potassium chloride SA 20 MEQ tablet      TAKE these medications       acetaminophen 160 MG/5ML solution  Commonly known as:  TYLENOL  Take 20.3 mLs (650 mg total) by mouth every 8 (eight) hours as needed for mild pain.     calcitRIOL 0.5 MCG capsule  Commonly known as:  ROCALTROL  Take 2 capsules (1 mcg total) by mouth daily.     calcium carbonate 500 MG chewable tablet  Commonly known as:  HEALTHY MAMA TAME THE FLAME  Chew 2 tablets (400 mg of elemental calcium total) by mouth 3 (three) times daily.     cinacalcet 30 MG tablet  Commonly known as:  SENSIPAR  Take 1 tablet (30 mg total) by mouth daily with breakfast.     darbepoetin 200 MCG/0.4ML Soln injection  Commonly known as:  ARANESP  Inject 0.4 mLs (200 mcg total) into the skin every Saturday at 6 PM.     heparin 100-0.45 UNIT/ML-% infusion  Inject 1,000 Units/hr into the vein continuous.     HYDROcodone-acetaminophen 5-325 MG per tablet  Commonly known  as:  NORCO/VICODIN  Take 1-2 tablets by mouth every 4 (four) hours as needed for moderate pain.     lactose free nutrition Liqd  Take 237 mLs by mouth 3 (three) times daily between meals.     magnesium oxide 400 MG tablet  Commonly known as:  MAG-OX  Take 400 mg by mouth 3 (three) times daily.     MULTI-VITAMINS Tabs  Take 1 tablet by mouth daily.     mycophenolate 250 MG capsule  Commonly known as:  CELLCEPT  Take 1 capsule (250 mg total) by mouth 2 (two) times daily.     potassium chloride SA 20 MEQ tablet  Commonly known as:  K-DUR,KLOR-CON  Take 2 tablets (40 mEq total) by mouth daily.     sodium bicarbonate 650 MG tablet  Take 2 tablets (1,300 mg total) by mouth 3 (three) times daily.     tacrolimus 1 MG capsule  Commonly known as:  PROGRAF  Take 1 capsule (1 mg total) by mouth 2 (two) times daily.     warfarin 2 MG tablet  Commonly known as:  COUMADIN  Take 1 tablet (2 mg total) by mouth one time only at 6 PM.        Discharge Instructions: Please refer to Patient Instructions section of EMR for full details.  Patient was counseled important signs and symptoms that should prompt return to medical care, changes in medications, dietary instructions, activity restrictions, and follow up appointments.   Follow-Up Appointments: Follow-up Information   Call Phill Myron, MD. (As needed)    Specialty:  Family Medicine   Contact information:   [place] [phone]      Phill Myron, MD 06/20/2013, 10:55 PM PGY-1, [place]

## 2013-06-13 NOTE — Progress Notes (Signed)
FMTS Attending Note Patient seen and examined by me, see my addendum to H&P for more details.  I agree with Dr Milagros Reap assessment and plan as documented.  Dalbert Mayotte, MD

## 2013-06-13 NOTE — Progress Notes (Signed)
CRITICAL VALUE ALERT  Critical value received:  Hemoglobin 5.9 g/dL   Date of notification:  06/13/2013  Time of notification:  05:50AM  Critical value read back:yes  Nurse who received alert:  Von [place]oe [place], RN  MD notified (1st page):  Dr. Sheral Apley, Family Medicine Resident  Time of first page:  05:53AM  Responding MD: Dr. Sheral Apley  Time MD responded:  05:54AM

## 2013-06-13 NOTE — Progress Notes (Signed)
Attempted report to unit 2Heart, [id number].

## 2013-06-13 NOTE — Progress Notes (Addendum)
INITIAL NUTRITION ASSESSMENT  DOCUMENTATION CODES Per approved criteria  -Severe malnutrition in the context of chronic illness -Underweight   INTERVENTION:  Recommend liberalizing diet to Regular  Substitute soy milk for regular milk.  Resource Breeze po TID, each supplement provides 250 kcal and 9 grams of protein  NUTRITION DIAGNOSIS: Malnutrition related to chronic illness as evidenced by severe fat and muscle wasting.   Goal: Pt to meet >/= 90% of their estimated nutrition needs   Monitor:  PO intake, supplement acceptance, weight trend, labs  Reason for Assessment: Pt identified as at nutrition risk on the Malnutrition Screen Tool  53 y.o. female  Admitting Dx: <principal problem not specified>  ASSESSMENT: Pt with hx of CKD and chronic anemia s/p kidney transplant at [facility].  Pt and care giver provide hx. They report that pt has lost a significant about of weight 30-40 lb (23% weight loss in 7 months) in the presence of diarrhea. Per pt she has bouts of diarrhea that she feels previous physicians have not addressed, therefore she has been struggling trying to figure out why she is having diarrhea. They are very aware of what she does not tolerate. Pt does not follow a renal diet at home.  Magnesium and Potassium are low and pt is receiving replacement as well as a MVI.  Pt uses supplements at home but does not tolerate the plus versions of the supplements states that they are too strong for her. Pt does focus on protein at home, her typical meal is salmon and rice or steak. Once she starts to have diarrhea pt becomes very weak and eats very little.   Nutrition Focused Physical Exam:  Subcutaneous Fat:  Orbital Region: severe wasting Upper Arm Region: severe wasting Thoracic and Lumbar Region: severe wasting  Muscle:  Temple Region: severe wasting Clavicle Bone Region: severe wasting Clavicle and Acromion Bone Region: severe wasting  Scapular Bone Region: severe  wasting Dorsal Hand: severe wasting Patellar Region: severe wasting Anterior Thigh Region: severe wasting Posterior Calf Region: severe wasting  Edema: not present   Height: Ht Readings from Last 1 Encounters:  06/13/13 5\' 3"  (1.6 m)    Weight: Wt Readings from Last 1 Encounters:  06/13/13 100 lb (45.36 kg)    Ideal Body Weight: 52.2 kg   % Ideal Body Weight: 87%  Wt Readings from Last 10 Encounters:  06/13/13 100 lb (45.36 kg)  06/08/11 130 lb (58.968 kg)  05/18/11 130 lb (58.968 kg)    Usual Body Weight: 130-140 lb   % Usual Body Weight: 77%  BMI:  Body mass index is 17.72 kg/(m^2).  Estimated Nutritional Needs: Kcal: 1400-1600 Protein: 67-80 grams Fluid: >1.5 L/day  Skin: no issues noted  Diet Order: Renal  EDUCATION NEEDS: -No education needs identified at this time   Intake/Output Summary (Last 24 hours) at 06/13/13 1123 Last data filed at 06/12/13 2242  Gross per 24 hour  Intake    237 ml  Output      0 ml  Net    237 ml    Last BM: 4/16   Labs:   Recent Labs Lab 06/12/13 1500 06/12/13 1529 06/12/13 2210 06/13/13 0500  NA 143 143  --  143  K 3.6* 4.0  --  3.5*  CL 109 112  --  109  CO2 14* 14*  --  15*  BUN 66* 66*  --  65*  CREATININE 4.41* 4.29*  --  4.24*  CALCIUM 7.3* 7.4*  --  7.0*  MG  --   --  0.9* 0.8*  PHOS 1.5*  --   --   --   GLUCOSE 95 92  --  88    CBG (last 3)  No results found for this basename: GLUCAP,  in the last 72 hours  Scheduled Meds: . calcitRIOL  0.5 mcg Oral Daily  . calcium carbonate  2 tablet Oral TID  . lactose free nutrition  237 mL Oral TID BM  . magnesium oxide  400 mg Oral TID  . magnesium sulfate 1 - 4 g bolus IVPB  4 g Intravenous Once  . multivitamin with minerals  1 tablet Oral Daily  . mycophenolate  500 mg Oral BID  . potassium chloride SA  40 mEq Oral Daily  . sodium bicarbonate  1,300 mg Oral TID  . sodium chloride  3 mL Intravenous Q12H  . tacrolimus  2 mg Oral BID     Continuous Infusions:   Past Medical History  Diagnosis Date  . Chronic kidney disease   . Anemia   . Stroke   . Hypertension   . Hypercalcemia   . Hypomagnesemia     Past Surgical History  Procedure Laterality Date  . Kidney transplant      [place], [place], [place] Pager [phone] After Hours Pager

## 2013-06-13 NOTE — Progress Notes (Addendum)
Stepdown bed available, pt will transfer to room 2H28.  Since stepdown bed available will not start blood transfusion at this time.                  h28

## 2013-06-13 NOTE — Progress Notes (Signed)
CRITICAL VALUE ALERT  Critical value received:  Magnesium 0.8mg /dL  Date of notification:  06/13/2013  Time of notification:  06:05AM    Critical value read back:yes  Nurse who received alert:  Rosalie Gums  MD notified (1st page): Dr. Sheral Apley, Family Medicine Resident  Time of first page:  06:23AM

## 2013-06-13 NOTE — Progress Notes (Signed)
PT Cancellation Note  Patient Details Name: [name] MRN: [id number] DOB: 1960-06-27   Cancelled Treatment:    Reason Eval/Treat Not Completed: Medical issues which prohibited therapy.  Pt. With current hgb of 5.9.  RN having pt. To use bed pan instead of getting OOB.  She prefers that PT eval be delayed until hgb. Is improved.  Will follow up later today as schedule permits, otherwise will proceed with evaluation tomorrow as her status allows.     Ladona Ridgel 06/13/2013, 12:16 PM Gerlean Ren PT Acute Rehab Services [phone] [place] [phone]

## 2013-06-13 NOTE — Progress Notes (Addendum)
Report given to Vladimir Creeks, RN on unit 2Heart.  Pt will transfer via bed with belongings to room 2H28.

## 2013-06-13 NOTE — Progress Notes (Signed)
Pt transferred via bed with belongings at this time, escorted by unit NT and RN charge nurse. Pt's family/friend at bedside and going with pt.

## 2013-06-13 NOTE — Progress Notes (Signed)
Family Medicine Teaching Service Daily Progress Note Intern Pager: [phone]  Patient name: [name] Medical record number: [id number] Date of birth: 03-Oct-1960 Age: 53 y.o. Gender: female  Primary Care Provider: Ulla Potash., MD Consultants: nephrology,  Code Status: full  Pt Overview and Major Events to Date:  4/15: Thrombocytopenia; Renal transplant (on anti-rejection medications) 4/16: Transfuse 1u; consult Nephrologist  Assessment and Plan: [name] is a 53 y.o. female presenting with anemia . PMH is significant for CKD (due to congential issue) s/p renal transplant at [facility], Stroke, Seizures and anemia.   # Anemia: Normocytic  - Chronic (likely secondary to CKD and anti-rejection medications) Hgb trending down from 8.5 (05/27/13); However concerning for additional subacute process. Reports baseline (~9-10) until September; Reports ~40 lbs wt loss since 9/14; FOBT (-); Never had colonoscopy; past due for mammogram  - Retic Production Index = 0.08; very inadequate response  - Iron and ferritin elevated w/ low UIBC  - Transfuse 1u today; Will be judicious with transfusing as she is currently being evaluated for second kidney transplant  - Consult Nephrologist; Discussed pt with Transplant doctor at [facility]  - PT/OT to eval/treat for weakness   # Pancytopenia - Plts 75; WBC 3.1; No baseline for comparison; Afebrile  - Likely due to CKD and anti-rejection medications, but viral etiology or Cancer possible  - Check HIV, Haptoglobin, Quantiferon Gold - Ddimer 5.09, LDH 254, Fibrinogen wnl INR 1.38, PTT wnl - LE dopplers - Smear: Spherocytes, elliptocytes  - Avoid use of heparin if possible  - Discussed anti-rejection medications with Renal and will await their recommendations  #CKD s/p renal transplant  - Renal fcn appears at baseline currently; Cr 4.29  - No electrolyte abnormalities; will continue to monitor  - Continue anti-rejection medications pending renal  recs  #Hypomagnesium - IV mag 4g; continue oral mag - monitor and replace as needed  # Weight loss  - Reports ~40 lbs wt loss since September along with intermittent loose stools, worsening anemia (see above) and rib pain. Never had colonoscopy and is past due for Mammogram. Denies any changes to her renal medications  - Symptoms potentially related to worsening renal fcn possible transplant rejection, but concerning for malignancy as well  - Currently w/o PCP other than nephrologist   # Stroke & Seizures  - Reports stroke and subsequent seizures w/o residual deficits after episode of hypotension  - No symptoms, seizures or medications since   FEN/GI: SLIV; Diet renal  Prophylaxis: SCDs   Disposition: Admit for observation; discharge pending nephrology recommendations  Subjective:  Continues to endorse weakness, fatigue, poor appetite; however denies CP, SOB. No dizziness with standing.   Objective: Temp:  [97.4 F (36.3 C)-98.4 F (36.9 C)] 98.4 F (36.9 C) (04/16 0440) Pulse Rate:  [71-97] 90 (04/16 0440) Resp:  [16-20] 16 (04/16 0440) BP: (84-109)/(52-67) 85/52 mmHg (04/16 0440) SpO2:  [93 %-100 %] 99 % (04/16 0440) Weight:  [100 lb (45.36 kg)] 100 lb (45.36 kg) (04/16 0750) Physical Exam: General: Thin frail Female; NAD. Appears fatigued  HEENT: MMM, No cervical adenopathy  Cardiovascular: RRR, no m/r/g; Cap refill < 3 secs  Respiratory: CTAB, normal resp effort  Abdomen: SNTND; BS+; No masses, RUQ hernia at site of previous incision  Extremities: LE cool but pulses 2+; No edema  MSK: Lateral lower rib tenderness b/l   Laboratory:  Recent Labs Lab 06/12/13 1529 06/13/13 0500  WBC 3.5* 3.1*  HGB 7.1* 5.9*  HCT 23.1* 19.0*  PLT 75* 64*  Recent Labs Lab 06/12/13 1500 06/12/13 1529 06/13/13 0500  NA 143 143 143  K 3.6* 4.0 3.5*  CL 109 112 109  CO2 14* 14* 15*  BUN 66* 66* 65*  CREATININE 4.41* 4.29* 4.24*  CALCIUM 7.3* 7.4* 7.0*  PROT  --   --   5.0*  BILITOT  --   --  0.3  ALKPHOS  --   --  383*  ALT  --   --  7  AST  --   --  12  GLUCOSE 95 92 88   Iron/TIBC/Ferritin    Component Value Date/Time   IRON 180* 06/12/2013 1618   TIBC NOT CALC 06/12/2013 1618   FERRITIN 489* 06/12/2013 1618   B12: 734 Folate: 8.4 Retic 0.4%; Count 11.4  Magnesium: 0.8 Trop: Neg FOBT: Neg  Imaging/Diagnostic Tests: CXR FINDINGS: 06/12/13  The heart is mildly enlarged. There is no evidence of pulmonary  edema, consolidation, pneumothorax, nodule or pleural fluid. Lateral  projection shows a watch diet appearance of the thoracic vertebral  bodies suggestive of renal osteodystrophy. No fractures are  identified.  IMPRESSION:  No acute findings. 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   Phill Myron, MD 06/13/2013, 8:06 AM PGY-1, [place] Intern pager: [phone], text pages welcome

## 2013-06-13 NOTE — Progress Notes (Signed)
UR completed 

## 2013-06-13 NOTE — H&P (Signed)
FMTS Attending Admit Note Patient seen and examined by me, discussed with resident team and I agree with Dr Darnelle Going admission H&P, assess/plan.  Patient is s/p renal transplant in 2005 in [place] for congenital renal disease, under the care of the [facility] renal transplant service and being worked up for another kidney transplant there.  She is admitted with subacute anemia, which has worsened overnight (Hgb down to 5.9 this morning) with pancytopenia (WBC 3.1, Plt 64K).  She reports feeling weak and experiencing a 40# weight loss over the past month, denies fevers/chills/sweats.  Is not up to date for cancer screening, last mammogram and pap smear was several years ago.   This morning she complains of R sided chest wall pain that may be slightly better with deep breathing; no cough or dyspnea. No vomiting but some nausea; no blood per rectum or abdominal pain.  No flank pain.  No visible sources of bleeding that she has noticed.  On exam she is comfortable and pleasant, no acute distress except for occasional wincing with spasmodic chest pain. Her SBP was come down to 80s on most recent vitals.   Neck supple. Pallor conjunctivae and mucus membranes.  COR Regular S1S2 CHEST WALL: tenderness to palpate over left chest wall.  ABD Soft, no masses or focal tenderness.  No lower extremity edema or tenderness, no popliteal tenderness.  D-Dimer markedly elevated this morning at 5; mildly elevated LDH and elevated Alk Phos (380).  Low phosphorus on admission (1.5), low albumin and calcium.   A/P: Patient with pancytopenia and dropping Hgb since admission, now with hemodynamic instability. Orders in for transfusion PRBCs, to transfer to SDU and consider IVF bolus while awaiting PRBCs. To communicate this turn of events with the Renal service.  Regarding anemia, considering hemolysis, with elevated LDH and awaiting haptoglobin and peripheral smear.  In setting of chest pain, hypotension, and elevated D-dimer,  consider VTE. Would consider VQ scan to avoid IV contrast in renal transplant patient. LE doppler US to evaluate for LE DVTs.  To consider non-contrast CT survey for malignancy, also SPEP/UPEP in patient with pancytopenia, weight loss and bony pain.  Dedicated rib films. Update cancer screening (mammography, pap smear, colon cancer screening) as outpatient. Breast exam and full lymph node exam here.  Dalbert Mayotte, MD

## 2013-06-13 NOTE — Progress Notes (Signed)
OT Cancellation Note  Patient Details Name: [name] MRN: [id number] DOB: March 24, 1960   Cancelled Treatment:    Reason Eval/Treat Not Completed: Medical issues which prohibited therapy. Pt. With current hgb of 5.9. Per PT note: RN having pt. To use bed pan instead of getting OOB. She prefers that PT eval be delayed until hgb. is improved.    Juluis Rainier [id number] 06/13/2013, 1:19 PM

## 2013-06-13 NOTE — Consult Note (Signed)
Reason for Consult:pancytopenia in setting of immunosuppressive therapy and kidney transplantation Referring Physician: Lindell Noe, MD  [name] is an 53 y.o. female.  HPI: The patient is a 53 year old African-American woman with a history of ESRD status post cadaveric kidney transplant nine years ago in [place] (01/14/04) followed by Dr. Posey Pronto in our office. 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  She has been maintained on prograf and cellcept.  She presented to [facility] with weakness and was noted to have panctyopenia.  We were asked to help evaluate and manage her immunosuppressive agents in this setting.   She has a h/o nonadherence to ESA therapy, however she has improved her compliance with Procrit therapy recently and keeps her weekly appointments at short stay, however her Hgb has dropped from 10 g/dL on 3/15 down to 7.6 at time of admission which has now dropped to 5.9. She does report 18 month h/o anorexia and 40 lb weight loss.  She was admitted at [facility] with AKI/CKD related to volume depletion and malnutrition in September 2014 and has received multiple transfusions.  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 Unclear when her last CMV level has been measured.  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    She has had advanced CKD for several years and she remains unsure as to whether she would want to pursue any form of renal replacement therapy. She is considering getting her "things in order" rather than pursue dialysis of any kind.  I don't think PD would be an option given her caloric and protein malnutrition.  She is reluctant to have an AVF placed at this time and wants to consider this some more.    Trend in Creatinine: Creatinine, Ser  Date/Time Value Ref Range Status  06/13/2013  5:00 AM 4.24* 0.50 - 1.10 mg/dL Final  06/12/2013  3:29 PM 4.29* 0.50 -  1.10 mg/dL Final  06/12/2013  3:00 PM 4.41* 0.50 - 1.10 mg/dL Final  06/03/2013  3:26 PM 4.30* 0.50 - 1.10 mg/dL Final  05/27/2013  1:54 PM 4.48* 0.50 - 1.10 mg/dL Final  05/13/2013  2:15 PM 3.95* 0.50 - 1.10 mg/dL Final  04/29/2013  1:27 PM 3.62* 0.50 - 1.10 mg/dL Final  04/15/2013  1:14 PM 4.00* 0.50 - 1.10 mg/dL Final  04/01/2013  1:24 PM 3.68* 0.50 - 1.10 mg/dL Final  03/22/2013  2:06 PM 3.45* 0.50 - 1.10 mg/dL Final  03/05/2013  1:48 PM 3.82* 0.50 - 1.10 mg/dL Final  02/18/2013  2:00 PM 3.92* 0.50 - 1.10 mg/dL Final  02/11/2013  1:20 PM 4.17* 0.50 - 1.10 mg/dL Final  10/11/2012  3:10 PM 3.54* 0.50 - 1.10 mg/dL Final  09/14/2012  2:30 PM 3.58* 0.50 - 1.10 mg/dL Final  07/05/2012  3:00 PM 3.59* 0.50 - 1.10 mg/dL Final  06/20/2012 11:10 AM 3.74* 0.50 - 1.10 mg/dL Final  03/07/2011 10:59 AM 3.07* 0.50 - 1.10 mg/dL Final  09/07/2010  6:20 AM 4.34* 0.50 - 1.10 mg/dL Final  09/06/2010  4:40 AM 4.60* 0.50 - 1.10 mg/dL Final  09/05/2010  6:20 AM 4.95* 0.50 - 1.10 mg/dL Final  09/04/2010  8:13 PM 4.95* 0.50 - 1.10 mg/dL Final  09/04/2010  5:31 AM 5.04* 0.50 - 1.10 mg/dL Final  09/04/2010  5:00 AM 5.30* 0.50 - 1.10 mg/dL Final  09/03/2010  9:44 PM 5.56* 0.50 - 1.10 mg/dL Final  09/03/2010  1:28 PM  5.88* 0.50 - 1.10 mg/dL Final  09/03/2010  3:55 AM 6.19* 0.50 - 1.10 mg/dL Final  09/02/2010  6:00 PM 7.10* 0.50 - 1.10 mg/dL Final    PMH:   Past Medical History  Diagnosis Date  . Chronic kidney disease   . Anemia   . Stroke   . Hypertension   . Hypercalcemia   . Hypomagnesemia   . Complications of transplanted kidney   . Chronic kidney disease, stage IV (severe)   . Loss of weight   . Pancytopenia   . Adverse effect of immunosuppressive drug     PSH:   Past Surgical History  Procedure Laterality Date  . Kidney transplant      Allergies:  Allergies  Allergen Reactions  . Cephalosporins Diarrhea and Nausea Only  . Erythromycin Diarrhea and Nausea Only  . Fexofenadine-Pseudoephed Er Other (See Comments)     dizzy    Medications:   Prior to Admission medications   Medication Sig Start Date End Date Taking? Authorizing Provider  acetaminophen (TYLENOL) 500 MG tablet Take 500-1,000 mg by mouth every 6 (six) hours as needed for mild pain.   Yes Historical Provider, MD  calcitRIOL (ROCALTROL) 0.5 MCG capsule Take 0.5 mcg by mouth daily.   Yes Historical Provider, MD  calcium carbonate (HEALTHY MAMA TAME THE FLAME) 500 MG chewable tablet Chew 2 tablets by mouth 3 (three) times daily.  11/16/12 11/16/13 Yes Historical Provider, MD  lactose free nutrition (BOOST) LIQD Take 237 mLs by mouth 3 (three) times daily between meals.   Yes Historical Provider, MD  magnesium oxide (MAG-OX) 400 MG tablet Take 400 mg by mouth 3 (three) times daily.  11/16/12 11/16/13 Yes Historical Provider, MD  Multiple Vitamin (MULTI-VITAMINS) TABS Take 1 tablet by mouth daily.   Yes Historical Provider, MD  mycophenolate (CELLCEPT) 500 MG tablet Take 500 mg by mouth 2 (two) times daily.   Yes Historical Provider, MD  OVER THE COUNTER MEDICATION Take 1 tablet by mouth daily. Mega-red   Yes Historical Provider, MD  Potassium Chloride ER 20 MEQ TBCR Take 2 tablets by mouth daily.  11/16/12 11/16/13 Yes Historical Provider, MD  sodium bicarbonate 325 MG tablet Take 1,300 mg by mouth 3 (three) times daily.    Yes Historical Provider, MD  tacrolimus (PROGRAF) 1 MG capsule Take 2 mg by mouth 2 (two) times daily.    Yes Historical Provider, MD    Inpatient medications: . calcitRIOL  0.5 mcg Oral Daily  . calcium carbonate  2 tablet Oral TID  . lactose free nutrition  237 mL Oral TID BM  . magnesium oxide  400 mg Oral TID  . multivitamin with minerals  1 tablet Oral Daily  . mycophenolate  500 mg Oral BID  . potassium chloride SA  40 mEq Oral Daily  . sodium bicarbonate  1,300 mg Oral TID  . sodium chloride  3 mL Intravenous Q12H  . tacrolimus  2 mg Oral BID    Discontinued Meds:   Medications Discontinued During This Encounter   Medication Reason  . acetaminophen (TYLENOL) tablet 650 mg   . acetaminophen (TYLENOL) 325 MG tablet Inpatient Standard  . calcitRIOL (ROCALTROL) 0.25 MCG capsule Inpatient Standard  . heparin injection 5,000 Units     Social History:  reports that she has never smoked. She has never used smokeless tobacco. She reports that she does not drink alcohol or use illicit drugs.  Family History:  No family history on file.  A comprehensive review  of systems was negative except for: Constitutional: positive for anorexia, malaise and weight loss Gastrointestinal: positive for abdominal pain and nausea Musculoskeletal: positive for back pain, bone pain, muscle weakness and stiff joints Neurological: positive for weakness Weight change:   Intake/Output Summary (Last 24 hours) at 06/13/13 1336 Last data filed at 06/13/13 1100  Gross per 24 hour  Intake    457 ml  Output      0 ml  Net    457 ml   BP 96/58  Pulse 93  Temp(Src) 98.2 F (36.8 C) (Oral)  Resp 16  Ht '5\' 3"'  (1.6 m)  Wt 45.36 kg (100 lb)  BMI 17.72 kg/m2  SpO2 100% Filed Vitals:   06/13/13 0750 06/13/13 0817 06/13/13 0934 06/13/13 1100  BP:  '84/50 98/50 96/58 '  Pulse:  86 88 93  Temp:    98.2 F (36.8 C)  TempSrc:    Oral  Resp:  '16 16 16  ' Height: '5\' 3"'  (1.6 m)     Weight: 45.36 kg (100 lb)     SpO2:    100%     General appearance: alert, cooperative, appears older than stated age, cachectic, fatigued and no distress Head: Normocephalic, without obvious abnormality, atraumatic Eyes: negative findings: lids and lashes normal, conjunctivae and sclerae normal and corneas clear Neck: no adenopathy, no carotid bruit, no JVD, supple, symmetrical, trachea midline and thyroid not enlarged, symmetric, no tenderness/mass/nodules Resp: clear to auscultation bilaterally Cardio: regular rate and rhythm, S1, S2 normal, no murmur, click, rub or gallop GI: Distended, decreased BS, tender to deep palpation, no  guarding/rebound Extremities: extremities normal, atraumatic, no cyanosis or edema  Labs: Basic Metabolic Panel:  Recent Labs Lab 06/12/13 1500 06/12/13 1529 06/13/13 0500  NA 143 143 143  K 3.6* 4.0 3.5*  CL 109 112 109  CO2 14* 14* 15*  GLUCOSE 95 92 88  BUN 66* 66* 65*  CREATININE 4.41* 4.29* 4.24*  ALBUMIN 3.5  --  2.9*  CALCIUM 7.3* 7.4* 7.0*  PHOS 1.5*  --   --    Liver Function Tests:  Recent Labs Lab 06/12/13 1500 06/13/13 0500  AST  --  12  ALT  --  7  ALKPHOS  --  383*  BILITOT  --  0.3  PROT  --  5.0*  ALBUMIN 3.5 2.9*   No results found for this basename: LIPASE, AMYLASE,  in the last 168 hours No results found for this basename: AMMONIA,  in the last 168 hours CBC:  Recent Labs Lab 06/12/13 1439 06/12/13 1529 06/13/13 0500  WBC  --  3.5* 3.1*  HGB 7.1* 7.1* 5.9*  HCT  --  23.1* 19.0*  MCV  --  84.3 83.7  PLT  --  75* 64*   PT/INR: '@LABRCNTIP' (inr:5) Cardiac Enzymes: )No results found for this basename: CKTOTAL, CKMB, CKMBINDEX, TROPONINI,  in the last 168 hours CBG: No results found for this basename: GLUCAP,  in the last 168 hours  Iron Studies:  Recent Labs Lab 06/12/13 1618  IRON 180*  TIBC NOT CALC  FERRITIN 489*    Xrays/Other Studies: Dg Chest 2 View  06/12/2013   CLINICAL DATA:  Weakness, diarrhea and right-sided chest pain. History of stroke and chronic kidney disease.  EXAM: CHEST - 2 VIEW  COMPARISON:  DG CHEST 1V PORT dated 09/03/2010  FINDINGS: The heart is mildly enlarged. There is no evidence of pulmonary edema, consolidation, pneumothorax, nodule or pleural fluid. Lateral projection shows a watch diet appearance  of the thoracic vertebral bodies suggestive of renal osteodystrophy. No fractures are identified.  IMPRESSION: No acute findings. 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   Electronically Signed   By: Aletta Edouard M.D.   On: 06/12/2013 17:31     Assessment/Plan: 1. Pancytopenia- in setting of  advanced CKD (on ESA) due to allograft nephropathy and immunosuppressive therapy.   1. Decrease cellcept and prograf 2. Very worrisome for CMV reactivation will check DNA PCR and start gancyclovir if + 3. Also need to r/o malignancy given longstanding immunosuppressive therapy and weight loss. 4. REcommend Hematology consult and bone marrow biopsy to r/o malignancy/PTLD 5. Guaiac stool and follow 6. Cont to follow and transfuse as needed 2. 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 She is not sure she would resume RRT if needed.  May need to involve palliative care if her condition worsens as she has mentioned "getting affairs in order" 2. No significant change in overall renal function and actually improved over the last 2 years (although has lost more body weight) 3. Immunosuppressive therapy- will decrease cellcept to 250 bid and prograf to 64m bid and check prograf level in am 4. Anemia of chronic disease- cont with procrit 5. Protein malnutrition- increase supplementation and consider Nutritionist eval 6. SHPTH/renal osteodystrophy- likely source of increased Alk phos and LDH, cont with vit D.  CXR c/w renal osteodystrophy changes 7. Metabolic acidosis- start bicarb po and follow 8. Hypokalemia- replete and increase po intake   JDonetta Potts4/16/2015, 1:36 PM

## 2013-06-13 NOTE — Progress Notes (Addendum)
Spoke with Dr Skeet Simmer concerning pt order to transfer to stepdown and blood transfusion. MD wants pt to transfer to stepdown for closer monitoring due to "low hgb, low BP,  pancytopenia, concern for infiltrated process in bone ? Leukemia, elevated D-dimer", "feels pt may make a turn for the worst, and wants in stepdown unit" Dr Skeet Simmer states.  Waiting for stepdown bed.  Made aware blood transfusion has not occurred yet, magnesium was completing. And clarified okay for transfusion since there had been concern for pt since she is a renal transplant and is going to need another renal transplant.  Okay for transfusion to occur per Dr Skeet Simmer.

## 2013-06-14 DIAGNOSIS — T8610 Unspecified complication of kidney transplant: Secondary | ICD-10-CM

## 2013-06-14 DIAGNOSIS — N186 End stage renal disease: Secondary | ICD-10-CM

## 2013-06-14 DIAGNOSIS — R634 Abnormal weight loss: Secondary | ICD-10-CM

## 2013-06-14 DIAGNOSIS — M79609 Pain in unspecified limb: Secondary | ICD-10-CM

## 2013-06-14 DIAGNOSIS — D61818 Other pancytopenia: Principal | ICD-10-CM

## 2013-06-14 LAB — CBC
HEMATOCRIT: 25.2 % — AB (ref 36.0–46.0)
HEMOGLOBIN: 7.9 g/dL — AB (ref 12.0–15.0)
MCH: 25.7 pg — AB (ref 26.0–34.0)
MCHC: 31.3 g/dL (ref 30.0–36.0)
MCV: 82.1 fL (ref 78.0–100.0)
Platelets: 59 10*3/uL — ABNORMAL LOW (ref 150–400)
RBC: 3.07 MIL/uL — AB (ref 3.87–5.11)
RDW: 20 % — ABNORMAL HIGH (ref 11.5–15.5)
WBC: 3.6 10*3/uL — AB (ref 4.0–10.5)

## 2013-06-14 LAB — BASIC METABOLIC PANEL
BUN: 54 mg/dL — AB (ref 6–23)
CHLORIDE: 115 meq/L — AB (ref 96–112)
CO2: 15 mEq/L — ABNORMAL LOW (ref 19–32)
Calcium: 8 mg/dL — ABNORMAL LOW (ref 8.4–10.5)
Creatinine, Ser: 3.96 mg/dL — ABNORMAL HIGH (ref 0.50–1.10)
GFR calc Af Amer: 14 mL/min — ABNORMAL LOW (ref >90)
GFR, EST NON AFRICAN AMERICAN: 12 mL/min — AB (ref >90)
Glucose, Bld: 89 mg/dL (ref 70–99)
Potassium: 4.3 mEq/L (ref 3.7–5.3)
SODIUM: 144 meq/L (ref 137–147)

## 2013-06-14 LAB — MRSA PCR SCREENING: MRSA by PCR: NEGATIVE

## 2013-06-14 LAB — SAVE SMEAR

## 2013-06-14 NOTE — Care Management Note (Addendum)
    Page 1 of 1   06/18/2013     2:59:59 PM CARE MANAGEMENT NOTE 06/18/2013  Patient:  [name],[name]   Account Number:  [id number]  Date Initiated:  06/14/2013  Documentation initiated by:  Elissa Hefty  Subjective/Objective Assessment:   adm w anemia     Action/Plan:   pt lives w friend.   Anticipated DC Date:     Anticipated DC Plan:  IP REHAB FACILITY  In-house referral  Clinical Social Worker  Hospice / [place]  CM consult      [facility] Choice  IP REHAB   Choice offered to / List presented to:             Status of service:   Medicare Important Message given?   (If response is "NO", the following Medicare IM given date fields will be blank) Date Medicare IM given:   Date Additional Medicare IM given:    Discharge Disposition:  IP REHAB FACILITY  Per UR Regulation:  Reviewed for med. necessity/level of care/duration of stay  If discussed at [place] of Stay Meetings, dates discussed:   06/18/2013    Comments:  4/21  1458 [name] Romain Erion rn,bsn left pt health connect phone number. will enc to ck w her ins provider for list of phy in network.  4/17 0925 [name] Earsie Humm rn,bsn will await phy ther rec. pal care to see pt also. spoke w pt and friend. gave them sitter/priv duty and hhc agency list. friend does cooking and cleaning but pt to price out someone to help w meals. will await phy ther eval and assist w hhc if needed.

## 2013-06-14 NOTE — Progress Notes (Signed)
FMTS Attending Daily Note:  Annabell Sabal MD  [phone] pager  Family Practice pager:  [phone] I have seen and examined this patient and have reviewed their chart. I have discussed this patient with the resident. I agree with the resident's findings, assessment and care plan.  Alveda Reasons, MD 06/14/2013 4:08 PM

## 2013-06-14 NOTE — Consult Note (Signed)
.   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 She is on Cellcept and Prograf which since admission have been dose decreased.. She presented on 4/15 with generalized weakness and poor oral intake. She was noted to have pancytopenia.Of note, the patient has been significantly anemic over the last 8 months,requiring Procrit therapy as an outpatient.She has also received multiple transfusions since September of last year. In the past, she has been seen by a hematologist while living in [place], undergoing extensive workup for myeloma which was inconclusive at the time. That was neve followed up,per patient report.in 2012, and SPEP and UPEP immunofixation were negative for M. Spike and Bence-Jones protein, but had some abnormalities in the globulins and light chains (see below) Other symptoms included 40 pound weight loss since September 2014.she also has generalized muscle weakness in 01/2013 and lately significant left rib pain in the setting of renal osteodystrophy. Denies gum bleed,epistaxis or hemoptysis.Denies easy bruising.Denies fevers,chills or  night sweats.No ASA, NSAID or Lovenox PT/ INR. PTT.Denies risk factors for HIV or hepatitis.No hemoptysis or epistaxis.Denies blood in urine or in stool.   Prior data and office notes from [facility] everywhere are not available for review, as patient has not yet signed the release  of information. Form has been requested STAT to evaluate.  Available data from [facility] shows admission WBC of 3.5 without differential. Her Hb/Hct was 7.1/23.1 respectively and platelets were 75,000.Retic absolute was 11.4. Smear showed spherocytes, elliptocytes and decreased platelets. No schistocytes were seen. Her Hb dropped significantly to 5.9 on 4/16 with Hct 19, requiring transfusion with some improvement.  Today her white count is 3.6 with a hemoglobin and hematocrit of 7.9 and 25.2 respectively, MCV 82.1 and platelets of 59,000. haptoglobin was 138.d-dimer was 5.09 with fibrinogen to 29 and normal PT and INR,and PTT.  LDH was 265, Fe levels on 4/15 were 180, Ferritin 489 ,B12 734, Folate 8.4.UA ,protein in blood,leukocytes. SPEP and UPEP taken in 2012 had a negative M spike with slightly decreased gammaglobulins. Her UPEP at the time showed no monoclonal free light chains (Bence Jones Protein) Polyclonal increase in free Kappa and/or free Lambda light chains were noted. New SPEP and UPEP with IFE are pending.HIV is non reactive. Hemoccult negative. CMV reactivation may be present, followed by renal with DNA PCR pending. Smear has been ordered for review.   We were kindly asked to see the patient with recommendations.   PMH:  Past Medical History  Diagnosis Date  . Chronic kidney disease   . Anemia   . Stroke   . Hypertension   . Hypercalcemia   . Hypomagnesemia   . Complications of transplanted kidney   .  Chronic kidney disease, stage IV (severe)   . Loss of weight   . Pancytopenia   . Adverse effect of immunosuppressive drug     Surgeries:  Past Surgical History  Procedure Laterality Date  . Kidney transplant    s/p C section S/p Ureter correction surgery at age 83   Allergies:  Allergies  Allergen Reactions  . Cephalosporins Diarrhea and Nausea Only  . Erythromycin Diarrhea and Nausea Only  . Fexofenadine-Pseudoephed Er Other (See Comments)    dizzy     Medications:   Prior to Admission:  Prescriptions prior to admission  Medication Sig Dispense Refill  . acetaminophen (TYLENOL) 500 MG tablet Take 500-1,000 mg by mouth every 6 (six) hours as needed for mild pain.      . calcitRIOL (ROCALTROL) 0.5 MCG capsule Take 0.5 mcg by mouth daily.      . calcium carbonate (HEALTHY MAMA TAME THE FLAME) 500 MG chewable tablet Chew 2 tablets by mouth 3 (three) times daily.       Marland Kitchen lactose free nutrition (BOOST) LIQD Take 237 mLs by mouth 3 (three) times daily between meals.      . magnesium oxide (MAG-OX) 400 MG tablet Take 400 mg by mouth 3 (three) times daily.       . Multiple Vitamin (MULTI-VITAMINS) TABS Take 1 tablet by mouth daily.      . mycophenolate (CELLCEPT) 500 MG tablet Take 500 mg by mouth 2 (two) times daily.      Marland Kitchen OVER THE COUNTER MEDICATION Take 1 tablet by mouth daily. Mega-red      . Potassium Chloride ER 20 MEQ TBCR Take 2 tablets by mouth daily.       . sodium bicarbonate 325 MG tablet Take 1,300 mg by mouth 3 (three) times daily.       . tacrolimus (PROGRAF) 1 MG capsule Take 2 mg by mouth 2 (two) times daily.        Scheduled Meds: . calcitRIOL  0.5 mcg Oral Daily  . calcium carbonate  2 tablet Oral TID  . feeding supplement (RESOURCE BREEZE)  1 Container Oral TID BM  . magnesium oxide  400 mg Oral TID  . multivitamin with minerals  1 tablet Oral Daily  . mycophenolate  250 mg Oral BID  . potassium chloride SA  40 mEq Oral Daily  . sodium bicarbonate  1,300 mg Oral TID  . sodium chloride  3 mL Intravenous Q12H  . tacrolimus  1 mg Oral BID   Continuous Infusions:  PRN Meds:.sodium chloride, HYDROcodone-acetaminophen, sodium chloride  CMK:LKJZPH chloride, HYDROcodone-acetaminophen, sodium chloride  ROS: Constitutional: Positive for 40 weight loss.Negative for fever,chills or night sweats. Positive for fatigue.  Eyes: Negative for blurred vision and double vision.  Respiratory: Negative for cough. No  hemoptysis.Positive shortness of breath.  Cardiovascular: Negative for chest pain. No palpitations.  GI: Negative for nausea,vomiting,diarrhea orconstipation.Denies change in bowel caliber. Denies melena or hematochezia.No abdominal pain.  XT:AVWPVXYI for hematuria.No loss of urinary control. Chronic decreased urinary output. Skin:Negative for itching.No petechial rash. No easy bruising.  Neurological:No headaches. No motor or sensory deficits. Musculoskeletal: left rib pain in the setting of renal osteodystrophy.Generalized muscle weakness.  Review of all other organ systems are otherwise negative   Family History:    No family history of hematological  Disorders.mother has a history of breast cancer. She has 2 siblings in good health.  Social History:  reports that she has never smoked. She has never used smokeless tobacco. She  reports that she does not drink alcohol or use illicit drugs. Divorced. Her boyfriend is accompanying her today.Lives in [place] with her son. She is a Careers information officer.  Physical Exam    Filed Vitals:   06/14/13 1229  BP: 122/75  Pulse: 84  Temp: 98.2 F (36.8 C)  Resp:     General: 53 y.o. female in no acute distress,well-developed and cachectic HEENT: normocephalic,atraumatic temporal wasting. .Oral cavity without thrush or lesions.Sclerae anicteric. Neck: supple,no thyromegaly.No cervical or supraclavicular adenopathy  Lungs:clear bilaterally.No wheezing,rhonchi or rales.No axillary masses. Breasts: not examined. Cardiac:regular rate and rhythm,no murmur,rubs or gallops Abdomen:soft,somewhat distended, which appears to have a fluid wave,date diffusely mildly tender to palpation,bowel sounds x4.No hepatosplenomegaly. No masses palpable.  GU/rectal: deferred. Extremities:no clubbing,cyanosis or edema. No bruising or petechial rash. No inguinal lymph nodes palpable.  Musculoskeletal: no spinal tenderness.chest bilateral  lower rib pain  Neuro:no motor or sensory deficts  Labs:    Recent Labs Lab 06/12/13 1439 06/12/13 1529 06/13/13 0500 06/14/13 0800  WBC  --  3.5* 3.1* 3.6*  HGB 7.1* 7.1* 5.9* 7.9*  HCT  --  23.1* 19.0* 25.2*  PLT  --  75* 64* 59*  MCV  --  84.3 83.7 82.1  MCH  --  25.9* 26.0 25.7*  MCHC  --  30.7 31.1 31.3  RDW  --  20.5* 20.6* 20.0*       Recent Labs Lab 06/12/13 1500 06/12/13 1529 06/12/13 2210 06/13/13 0500 06/13/13 1515 06/14/13 0800  NA 143 143  --  143  --  144  K 3.6* 4.0  --  3.5*  --  4.3  CL 109 112  --  109  --  115*  CO2 14* 14*  --  15*  --  15*  GLUCOSE 95 92  --  88  --  89  BUN 66* 66*  --  65*  --  54*  CREATININE 4.41* 4.29*  --  4.24*  --  3.96*  CALCIUM 7.3* 7.4*  --  7.0*  --  8.0*  MG  --   --  0.9* 0.8* 2.1  --   AST  --   --   --  12  --   --   ALT  --   --   --  7  --   --   ALKPHOS  --   --   --  383*  --   --   BILITOT  --   --   --  0.3  --   --         Component Value Date/Time   BILITOT 0.3 06/13/2013 0500   BILIDIR 0.1 09/03/2010 2144   IBILI 0.2* 09/03/2010 2144      Recent Labs Lab 06/13/13 0803  INR 1.38     Recent Labs  06/13/13 0803  DDIMER 5.09*     Anemia panel:   Recent Labs  06/12/13 1618  VITAMINB12 734  FOLATE 8.4  FERRITIN 489*  TIBC NOT CALC  IRON 180*  RETICCTPCT 0.4    Urinalysis    Component Value Date/Time   COLORURINE YELLOW 04/29/2013 1346   APPEARANCEUR CLEAR 04/29/2013 1346   LABSPEC 1.017 04/29/2013 1346   PHURINE 6.0 04/29/2013 1346   GLUCOSEU NEGATIVE 04/29/2013 1346   HGBUR NEGATIVE 04/29/2013 1346   BILIRUBINUR NEGATIVE 04/29/2013 1346   KETONESUR NEGATIVE 04/29/2013 1346   PROTEINUR 100* 04/29/2013 1346   UROBILINOGEN 0.2 04/29/2013 1346   NITRITE  NEGATIVE 04/29/2013 1346   LEUKOCYTESUR NEGATIVE 04/29/2013 1346    Drugs of Abuse  No results found for this basename: labopia, cocainscrnur, labbenz, amphetmu, thcu, labbarb      Imaging Studies:  Dg Chest 2 View  06/12/2013    CLINICAL DATA:  Weakness, diarrhea and right-sided chest pain. History of stroke and chronic kidney disease.  EXAM: CHEST - 2 VIEW  COMPARISON:  DG CHEST 1V PORT dated 09/03/2010  FINDINGS: The heart is mildly enlarged. There is no evidence of pulmonary edema, consolidation, pneumothorax, nodule or pleural fluid. Lateral projection shows a watch diet appearance of the thoracic vertebral bodies suggestive of renal osteodystrophy. No fractures are identified.  IMPRESSION: No acute findings. 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   Electronically Signed   By: Aletta Edouard M.D.   On: 06/12/2013 17:31      A/P: 53 y.o. female  Asked to see for evaluation of  1. Pancytopenia: in the setting of chronic disease, including ESRD status post cadaveric kidney transplant in 2005. Patient is stage IV, and is being followed by the renal service. Her white count is low, although no differential is available at this time.  Her anemia is very significant, acute on chronic. No recent ESA therapy has been given, but she did receive several transfusions in the recent past, especially since September 2014. Since admission, her hemoglobin dropped to down to 5.9, requiring One unit to date of blood.her Hemoccult was negative. On admission, the patient was on CellCept and Prograft, which she continues to receive was hospitalized. She is very symptomatic for anemia, for which she may have to receive more transfusions. Iron studies are consistent with chronic disease. Peripheral blood smear is available for review. SPEP is pending Her platelets are low, but they have been low for a long time, in the setting of CellCept and Prograft .prior smear showed no schistocytes. New smear is pending.Heparin, or Heparin products or aspirin have been given during this hospitalization.  2. ESRD, stage IV, as per renal service.  3. Abnormal weight loss with decreased appetite, the possibility of malignancy may need to be  entertained.consider Nutrition Consult  #4 History of Polyclonal increase in free Kappa and/or free Lambda light chains/slightly decreased gammaglobulins.new SPEP and UPEP with immunofixation is pending.Will try to obtain records from [facility] hopes to obtain information from Hematology consultation in [place].   5 right rib pain, in the setting of renal osteodystrophy. Patient has significant debilitation and pain.although the chest x-ray was negative or fracture consider metastatic bone survey to rule out any metastatic deposits, as she was worked up in the past for potential multiple myeloma.  6 Full Code  Dr. Juliann Mule.is to see the patient following this consult with recommendations regarding diagnosis and  further workup studies which may include a bone marrow biopsy if indicated.Supportive transfusion may berecommended if no underlying risk, if platelets less than 10,000 or acute bleed, Neupogen until ANC>1 and RBC transfusion if Hb <8 or acutely bleeding.  Addendum to this note to be written by consulting MD.   Thank you for the referral.  **Disclaimer: This note was dictated with voice recognition software. Similar sounding words can inadvertently be transcribed and this note may contain transcription errors which may not have been corrected upon publication of note.Rondel Jumbo, PA-C 06/14/2013 1:10 PM     ADDENDUM: Patient is 52 year old female status post cadaveric kidney transplant in 2005 on both Cellcept and prograf admitted with pancytopenia.  She also notes profound weight lost but she states ongoing diarrhea since September.  Agree with above.  Pancytopenia likely secondary to medications.  Her last cancer screening including mammogram was in 2008 and pelvic/pap smear.  She had not had a colonoscopy.  I think she should certainly undergo cancer screening.  She denies fevers, night sweats which would be concerning for post transplant lymphoproliferative disorder, but this should be  considered as well.  We will follow her MM w/u with SPEP, UPEP.   I personally saw this patient and performed a substantive portion of this encounter with the listed APP documented above.   Concha Norway, MD

## 2013-06-14 NOTE — Progress Notes (Signed)
PT Cancellation Note  Patient Details Name: [name] MRN: [id number] DOB: [birth year]   Cancelled Treatment:    Reason Eval Not Completed: Patient not medically ready. Noted +DVT RLE and not yet on anticoagulation. PT eval held this date. Will follow and proceed with evaluation when medically appropriate.   [name] Rout 06/14/2013, 3:02 PM Pager [phone]

## 2013-06-14 NOTE — Progress Notes (Signed)
Noted consult to social work. PT/OT has not yet evaluated pt for potential placement needs. Pt also to speak with palliative concerning goals of care. CSW following and will complete full assessment once disposition is addressed and pt is able to participate in PT/OT.   Ky Barban, MSW, [facility] Clinical Social Worker [phone]

## 2013-06-14 NOTE — Progress Notes (Signed)
Admit: 06/12/2013 LOS: 2  47F CKD4T (ddKT 2005, on MMF/Tac, followed by [facility]) admit with anemia/pancytopenia, 40lb weight loss.    Subjective:  No events o/n  Transfused yesterday Hematology consult ordered Pt w/o consistent diarrhea, N/V, abd pain, fevers, sweats Appetite is poor No rashes/lesions MMF and Tac reduced B12 replete CMV negative 04/2013 No dysuria  04/16 0701 - 04/17 0700 In: 790 [P.O.:390; Blood:300; IV Piggyback:100] Out: 350 [Urine:350]  Filed Weights   06/13/13 0750  Weight: 45.36 kg (100 lb)    Current meds: reviewed  Current Labs: reviewed    Physical Exam:  Blood pressure 106/64, pulse 82, temperature 98.7 F (37.1 C), temperature source Oral, resp. rate 18, height 5\' 3"  (1.6 m), weight 45.36 kg (100 lb), SpO2 100.00%. NAD RRR CTAB No LEE Nonfocal, aaox3 NCAT EOMI No rashes/lesions S/nt/nd  Assessment 1. Worsened Anemia / Pancytopenia; B12 replete. FOBT neg x1.   2. 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 2HPTH on calcitriol 0.5 daily 4. ? Hx/o CMV in peritransplant period 5. Hypomagnesemia likely 2/2 Tac  Plan 1. F/u hematology c/s today 2. Agree with reduced MMF/Tac 3. Cont calcitriol.  Phos at goal 4. Cont NaHCO3 1300 TID, uptitrate as needed to achieve NaHCO3>20 5. Await CMV serum PCR 6. GFR stable 7. Palliative care meeting today  Pearson Grippe MD 06/14/2013, 10:30 AM   Recent Labs Lab 06/12/13 1500 06/12/13 1529 06/13/13 0500 06/14/13 0800  NA 143 143 143 144  K 3.6* 4.0 3.5* 4.3  CL 109 112 109 115*  CO2 14* 14* 15* 15*  GLUCOSE 95 92 88 89  BUN 66* 66* 65* 54*  CREATININE 4.41* 4.29* 4.24* 3.96*  CALCIUM 7.3* 7.4* 7.0* 8.0*  PHOS 1.5*  --   --   --     Recent Labs Lab 06/12/13 1529 06/13/13 0500 06/14/13 0800  WBC 3.5* 3.1* 3.6*  HGB 7.1* 5.9* 7.9*  HCT 23.1* 19.0* 25.2*  MCV 84.3 83.7 82.1  PLT 75* 64* 59*

## 2013-06-14 NOTE — Consult Note (Signed)
Patient [name]      DOB: [birth year]      [id number]  Summary of Goals of care; full note to follow:  Patient wanted to talk through her current goals which include trying to achieve wellness to the point of being able to travel at will and plan to ultimately settle in [place], [place] for retirement.  She is open and realistic about her current goals which are to pursue curative treatment with consideration for repeat transplant either at [facility] or in [place] where the wait list is shorter.  Patient reports that she does not have any current questions regarding her current situation she relates that she would like to hold off on dialysis as long as possible as the literature that she has reviewed does not show a quality of life benefit for starting dialysis too far out form transplant.  She reported that she is not looking forward to "fighting this out" with her doctors as she feels strongly that the literature she reviewed is reliable.  The patient was having considerable left sided rib pain during our evaluation to the point of having her breath taken away.  We used a heat pack with some success but this is very noticeable very strong cramping pain that causes her to suck in her breath.  She agrees to talk further with me tomorrow regarding other goals for her care.    Recommend:  1.  Code status not addressed this visit. Working on quality of life issues.  2.  Left rib pain: ?related osteodystrophy vs PE ( agree not tachy), vs vert fracture(x ray -), heat did help a little. Continue heat.  Understand hem/onc is going to eval.  3.  Anemia- hem onc eval pending per PMD  4. Rib pain: could use lidocaine patch if very localized.  Currently on hydrocodone use with caution and monitor for build up of metabolites.   5. Severe protein calorie malnutrition: reviewed nutrition note.  Will see patient in am again to continue to incorporate new medical data into goals of care. Patient specifically  appreciated being able to verbalize what is important to her.  I encouraged her to journal her thoughts and continue to work on her art, writing, and dreaming even when she is exhausted.   Total: 1105- [place] Lovena Le, MD MBA The Palliative Medicine Team at [facility] Phone: [phone] Pager: [phone]

## 2013-06-14 NOTE — Progress Notes (Addendum)
NUTRITION FOLLOW UP   DOCUMENTATION CODES  Per approved criteria  -Severe malnutrition in the context of chronic illness  -Underweight   Intervention:   1.  Supplements; Company secretary 2.  Meals/snacks; continue soy milk per pt preference.   Nutrition Dx:   Malnutrition, ongoing  Monitor:   1.  Food/Beverage; pt meeting >/=90% estimated needs with tolerance. 2.  Wt/wt change; monitor trends  Assessment:   Pt with hx of CKD and chronic anemia s/p kidney transplant at [facility].  RD seen and assessed by RD yesterday.  Pt with severe wt loss, poor PO intake, and poor food tolerance as evidenced by chronic diarrhea.  Pt with low HgB and needs renal replacement therapy. Diet was liberalized to Regular today.  Pt being assessed at time of visit.  RD notes transfer to SDU related to HgB. Also note plan for [place] meeting with Palliative today. Will continue to follow.  Continue current interventions at this time.   Height: Ht Readings from Last 1 Encounters:  06/13/13 5\' 3"  (1.6 m)    Weight Status:   Wt Readings from Last 1 Encounters:  06/13/13 100 lb (45.36 kg)    Re-estimated needs:  Kcal: 1400-1600  Protein: 67-80 grams  Fluid: >1.5 L/day  Skin: intact  Diet Order: General   Intake/Output Summary (Last 24 hours) at 06/14/13 1110 Last data filed at 06/13/13 2300  Gross per 24 hour  Intake    450 ml  Output    350 ml  Net    100 ml    Last BM: 4/15   Labs:   Recent Labs Lab 06/12/13 1500 06/12/13 1529 06/12/13 2210 06/13/13 0500 06/13/13 1515 06/14/13 0800  NA 143 143  --  143  --  144  K 3.6* 4.0  --  3.5*  --  4.3  CL 109 112  --  109  --  115*  CO2 14* 14*  --  15*  --  15*  BUN 66* 66*  --  65*  --  54*  CREATININE 4.41* 4.29*  --  4.24*  --  3.96*  CALCIUM 7.3* 7.4*  --  7.0*  --  8.0*  MG  --   --  0.9* 0.8* 2.1  --   PHOS 1.5*  --   --   --   --   --   GLUCOSE 95 92  --  88  --  89    CBG (last 3)  No results found for this basename:  GLUCAP,  in the last 72 hours  Scheduled Meds: . calcitRIOL  0.5 mcg Oral Daily  . calcium carbonate  2 tablet Oral TID  . feeding supplement (RESOURCE BREEZE)  1 Container Oral TID BM  . magnesium oxide  400 mg Oral TID  . multivitamin with minerals  1 tablet Oral Daily  . mycophenolate  250 mg Oral BID  . potassium chloride SA  40 mEq Oral Daily  . sodium bicarbonate  1,300 mg Oral TID  . sodium chloride  3 mL Intravenous Q12H  . tacrolimus  1 mg Oral BID    Continuous Infusions:   Brynda Greathouse, MS RD LDN Clinical Inpatient Dietitian Pager: [phone] Weekend/After hours pager: [phone]

## 2013-06-14 NOTE — Progress Notes (Signed)
OT Cancellation Note  Patient Details Name: [name] MRN: [id number] DOB: 26-Mar-1960   Cancelled Treatment:    Reason Eval/Treat Not Completed: Medical issues which prohibited therapy. Earlier this AM, HgB level was not back so did not see pt, then HgB was up to 7.9 but pt being scanned for DVT so did not see pt, now pt does have a right: DVT noted in the saphenofemoral junction that extends into the common femoral vein. Superficial thrombus notedin the greater saphenous vein and has not been started on anti-coagulant. Will hold on eval for now.   Almon Register [id number] 06/14/2013, 2:44 PM

## 2013-06-14 NOTE — Consult Note (Signed)
Patient [id number] [name]      DOB: [birth year]      [id number]     Consult Note from the Palliative Medicine Team at [facility] Requested by: Dr. Loletha Grayer al.   PCP: Ulla Potash., MD Reason for Consultation: Goals of care    Phone [phone]  Assessment of patients Current state: Patient is a 53 year old [place] American female with known end-stage renal disease status post renal transplant in [place] in 2005. She presently is being worked up by [facility] for a second transplant.. She presents to the hospital with symptomatic anemia. The patient presents with a hemoglobin of 6.5 with decreasing numbers and pancytopenia. She also is been having intermittent diarrhea, and left-sided rib pain. She has had significant weight loss over the last month to 2 months. The patient and I reviewed her current medical conditions. She bite nature is a.m. investigative and curious  individual who has been very involved in her care. She reports that she is trying to hold off on returning to dialysis because she has read that it has no benefit to her to resume dialysis early based on the studies she has read on quality of life and now comes. We were able to discuss her general goals of care please see my additional note. Her goals are to travel and continue her studies as well as ultimately retire in [place].  Goals of Care: 1.  Code Status: Full will address in session if permitted by patient   2. Scope of Treatment: Continue fold scope of curative treatment and investigation  4. Disposition: To be determined. Patient may be an excellent candidate for CIR based on her significant deconditioning   3. Symptom Management:   1. Left rib pain. Trial of hot packs and continue treatment for underlying osteodystrophy with the use of opiates as cautious in nature due to the potential for toxic metabolite buildup. Consider adding lidocaine patch. Patient at this time is wanting to hold off on additional  medications including the patch. Agree with aggressive workup for additional causes for bone pain. Hematology oncology on board.  4. 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 She has a significant other who is active in her life. She has a son and a daughter and multiple grandchildren whom she is very attached and product. She specifically spoke about her granddaughter with whom she communicates regularly. She loves to travel. Her mother is still living which has its pros and cons as she is described as a very dominant figure whom at times can cross social boundaries and put pressure on the patient to perform. The patient states she wants to be her own person and has been labeled "the rebel" by her mother.  5. Spiritual: She was raised Catholic but at this time is not requesting spiritual support  Patient Documents Completed or Given: Document Given Completed  Advanced Directives Pkt    MOST    DNR    Gone from My Sight    Hard Choices      Brief HPI: 53 year old female status post renal transplant presenting with pancytopenia and severe anemia in the face of significant weight loss and deconditioning. She has developed left-sided chest pain over the last several weeks to months. We've been asked to assist with goals of care.   ROS: Left-sided chest pain crampy/fatty in nature intermittent exacerbated by moving relieved by holding still and relaxing, decreased appetite, positive confusion especially when her  blood pressure is low    PMH:  Past Medical History  Diagnosis Date  . Chronic kidney disease   . Anemia   . Stroke   . Hypertension   . Hypercalcemia   . Hypomagnesemia   . Complications of transplanted kidney   . Chronic kidney disease, stage IV (severe)   . Loss of weight   . Pancytopenia   . Adverse effect of immunosuppressive drug      PSH: Past Surgical History  Procedure Laterality Date  . Kidney  transplant     I have reviewed the [place] and SH and  If appropriate update it with new information. Allergies  Allergen Reactions  . Cephalosporins Diarrhea and Nausea Only  . Erythromycin Diarrhea and Nausea Only  . Fexofenadine-Pseudoephed Er Other (See Comments)    dizzy   Scheduled Meds: . calcitRIOL  0.5 mcg Oral Daily  . calcium carbonate  2 tablet Oral TID  . feeding supplement (RESOURCE BREEZE)  1 Container Oral TID BM  . magnesium oxide  400 mg Oral TID  . multivitamin with minerals  1 tablet Oral Daily  . mycophenolate  250 mg Oral BID  . potassium chloride SA  40 mEq Oral Daily  . sodium bicarbonate  1,300 mg Oral TID  . sodium chloride  3 mL Intravenous Q12H  . tacrolimus  1 mg Oral BID   Continuous Infusions:  PRN Meds:.sodium chloride, HYDROcodone-acetaminophen, sodium chloride    BP 122/75  Pulse 84  Temp(Src) 98.2 F (36.8 C) (Oral)  Resp 18  Ht 5\' 3"  (1.6 m)  Wt 45.36 kg (100 lb)  BMI 17.72 kg/m2  SpO2 100%   PPS: 30-40%   Intake/Output Summary (Last 24 hours) at 06/14/13 1301 Last data filed at 06/13/13 2300  Gross per 24 hour  Intake    450 ml  Output    350 ml  Net    100 ml    Physical Exam:  General: Cachectic ill-appearing female, in some distress when she developed left-sided chest pain she will grimace and catch her breath  HEENT:  Pupils are equal round and reactive to light extraocular muscles appear to be intact mucous membranes are dry Chest:   Decreased clear to auscultation no rhonchi rest or wheezes are noted CVS: Regular rate and rhythm positive S1 and S2 no S3-S4 don't appreciate a murmur rub or gallop Abdomen: Scaphoid slightly tender over right flank which appears to have a hernia related to her previous surgical site. Nondistended positive bowel sounds Ext: Very thin and frail no lesions no edema Neuro: Awake alert but feeling slightly foggy headed  Labs: CBC    Component Value Date/Time   WBC 3.6* 06/14/2013 0800   RBC  3.07* 06/14/2013 0800   RBC 2.85* 06/12/2013 1618   HGB 7.9* 06/14/2013 0800   HCT 25.2* 06/14/2013 0800   PLT 59* 06/14/2013 0800   MCV 82.1 06/14/2013 0800   MCH 25.7* 06/14/2013 0800   MCHC 31.3 06/14/2013 0800   RDW 20.0* 06/14/2013 0800   LYMPHSABS 1.2 09/02/2010 1648   MONOABS 0.2 09/02/2010 1648   EOSABS 0.0 09/02/2010 1648   BASOSABS 0.0 09/02/2010 1648     CMP     Component Value Date/Time   NA 144 06/14/2013 0800   K 4.3 06/14/2013 0800   CL 115* 06/14/2013 0800   CO2 15* 06/14/2013 0800   GLUCOSE 89 06/14/2013 0800   BUN 54* 06/14/2013 0800   CREATININE 3.96* 06/14/2013 0800   CALCIUM  8.0* 06/14/2013 0800   CALCIUM 8.8 04/29/2013 1327   PROT 5.0* 06/13/2013 0500   ALBUMIN 2.9* 06/13/2013 0500   AST 12 06/13/2013 0500   ALT 7 06/13/2013 0500   ALKPHOS 383* 06/13/2013 0500   BILITOT 0.3 06/13/2013 0500   GFRNONAA 12* 06/14/2013 0800   GFRAA 14* 06/14/2013 0800    Chest Xray Reviewed/Impressions:No acute findings. 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      Time In Time Out Total Time Spent with Patient Total Overall Time  11:05 AM   12 noon 55 minutes   55 minutes     Greater than 50%  of this time was spent counseling and coordinating care related to the above assessment and plan.   Danial Sisley L. Lovena Le, MD MBA The Palliative Medicine Team at [facility] Phone: [phone] Pager: [phone]

## 2013-06-14 NOTE — Progress Notes (Signed)
Family Medicine Teaching Service Daily Progress Note Intern Pager: [phone]  Patient name: [name] Medical record number: [id number] Date of birth: March 04, 1960 Age: 53 y.o. Gender: female  Primary Care Provider: Ulla Potash., MD Consultants: nephrology,  Code Status: full  Pt Overview and Major Events to Date:  4/15: Thrombocytopenia; Renal transplant (on anti-rejection medications) 4/16: Transfuse 1u; consult Nephrologist 4/17: C/s heme/onc and palliative care for [place]  Assessment and Plan: [name] is a 53 y.o. female presenting with anemia . PMH is significant for CKD (due to congential issue) s/p renal transplant at [facility], Stroke, Seizures and anemia.   # Anemia: Normocytic  - Chronic (likely secondary to CKD and anti-rejection medications) Hgb trending down from 8.5 (05/27/13); However concerning for additional subacute process. Reports baseline (~9-10) until September; Reports ~40 lbs wt loss since 9/14; FOBT (-); Never had colonoscopy; past due for mammogram  - Retic Production Index = 0.08; very inadequate response; Iron and ferritin elevated w/ low UIBC  - Hgb: pending s/p 1u RBCs; Will be judicious with transfusing as she is currently being evaluated for second kidney transplant  - Consulted Nephrologist; Discussed pt with Transplant doctor at [facility]: pending - PT/OT to eval/treat   # Pancytopenia & Weight loss  - Plts 75; WBC 3.1; No baseline for comparison; Afebrile;  Avoid use of heparin if possible - Reports ~40 lbs wt loss since September along with intermittent loose stools, worsening anemia (see above) and rib pain. Never had colonoscopy and is past due for Mammogram. Denies any changes to her renal medications  - Likely due to CKD and anti-rejection medications, but viral etiology or Cancer possible  - Renal decreased anti-rejection medications - Ddimer 5.09, LDH 254, Fibrinogen wnl INR 1.38, PTT wnl/ Haptoglobin wnl; HIV neg; Smear:  Spherocytes, elliptocytes  - Check Quantiferon Gold, EBV, CMV - Hematology consulted today - Palliative care c/s - Nutrition c/s  # DVT - LE dopplers: DVT noted in the saphenofemoral junction that extends into the common femoral vein - considering treatment; Plts ~ 60 - Awaiting Heme/Onc recs  # Rib Pain - SHPTH/renal osteodystrophy- likely source of increased Alk phos and LDH, cont with vit D.  -CXR c/w renal osteodystrophy changes - consider Lidocaine patch - Heating pad  #CKD s/p renal transplant  - Renal fcn appears at baseline currently; Cr 4.29  - No electrolyte abnormalities; will continue to monitor  - Continue anti-rejection medications as above - BMET  #Hypomagnesium; Resolved  - continue oral mag - monitor and replace as needed  # Stroke & Seizures  - Reports stroke and subsequent seizures w/o residual deficits after episode of hypotension  - No symptoms, seizures or medications since   FEN/GI: SLIV; Diet renal  Prophylaxis: SCDs   Disposition: Admit for observation; discharge pending nephrology recommendations  Subjective:  Feels better this morning, but still fatigued and weak. Doesn't want to "close any doors" but is unsure if she would go on dialysis again. Still interested in possible second renal transplant, but would like to "get her affairs in order and travel." She would like to speak with palliative care and Heme/onc about options and Goals of care.   Objective: Temp:  [98.1 F (36.7 C)-98.8 F (37.1 C)] 98.4 F (36.9 C) (04/17 0400) Pulse Rate:  [83-157] 85 (04/16 2300) Resp:  [9-22] 18 (04/17 0700) BP: (84-113)/(50-75) 101/64 mmHg (04/17 0323) SpO2:  [98 %-100 %] 100 % (04/17 0400) Weight:  [100 lb (45.36 kg)]  100 lb (45.36 kg) (04/16 0750) Physical Exam: General: Thin frail Female; NAD. Appears fatigued  HEENT: MMM, No cervical adenopathy  Cardiovascular: RRR, no m/r/g; Cap refill < 3 secs  Respiratory: CTAB, normal resp effort  Abdomen:  SNTND; BS+; No masses, RUQ hernia at site of previous incision  Extremities: LE cool but pulses 2+; No edema  MSK: Lateral lower rib tenderness b/l   Laboratory:  Recent Labs Lab 06/12/13 1439 06/12/13 1529 06/13/13 0500  WBC  --  3.5* 3.1*  HGB 7.1* 7.1* 5.9*  HCT  --  23.1* 19.0*  PLT  --  75* 64*    Recent Labs Lab 06/12/13 1500 06/12/13 1529 06/13/13 0500  NA 143 143 143  K 3.6* 4.0 3.5*  CL 109 112 109  CO2 14* 14* 15*  BUN 66* 66* 65*  CREATININE 4.41* 4.29* 4.24*  CALCIUM 7.3* 7.4* 7.0*  PROT  --   --  5.0*  BILITOT  --   --  0.3  ALKPHOS  --   --  383*  ALT  --   --  7  AST  --   --  12  GLUCOSE 95 92 88   Iron/TIBC/Ferritin    Component Value Date/Time   IRON 180* 06/12/2013 1618   TIBC NOT CALC 06/12/2013 1618   FERRITIN 489* 06/12/2013 1618   B12: 734 Folate: 8.4 Retic 0.4%; Count 11.4  Trop: Neg FOBT: Neg  Imaging/Diagnostic Tests: CXR FINDINGS: 06/12/13  The heart is mildly enlarged. There is no evidence of pulmonary  edema, consolidation, pneumothorax, nodule or pleural fluid. Lateral  projection shows a watch diet appearance of the thoracic vertebral  bodies suggestive of renal osteodystrophy. No fractures are  identified.  IMPRESSION:  No acute findings. 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   Phill Myron, MD 06/14/2013, 7:24 AM PGY-1, [place] Intern pager: [phone], text pages welcome

## 2013-06-14 NOTE — Progress Notes (Signed)
PT Cancellation Note  Patient Details Name: [name] MRN: [id number] DOB: January 14, 1961   Cancelled Treatment:    Reason Eval Not Completed: Patient not medically ready. Noted awaiting bil LE dopplers to rule out DVT.   [name] [name] 06/14/2013, 11:09 AM Pager [phone]

## 2013-06-14 NOTE — Progress Notes (Addendum)
Bilateral lower extremity venous duplex completed.  Right:  DVT noted in the saphenofemoral junction that extends into the common femoral vein.  Superficial thrombus noted in the greater saphenous vein.  No Baker's cyst.  Left:  No evidence of DVT, superficial thrombosis, or Baker's cyst.

## 2013-06-15 DIAGNOSIS — Z515 Encounter for palliative care: Secondary | ICD-10-CM

## 2013-06-15 DIAGNOSIS — D649 Anemia, unspecified: Secondary | ICD-10-CM

## 2013-06-15 DIAGNOSIS — N184 Chronic kidney disease, stage 4 (severe): Secondary | ICD-10-CM

## 2013-06-15 DIAGNOSIS — R079 Chest pain, unspecified: Secondary | ICD-10-CM

## 2013-06-15 DIAGNOSIS — E43 Unspecified severe protein-calorie malnutrition: Secondary | ICD-10-CM

## 2013-06-15 LAB — BASIC METABOLIC PANEL
BUN: 51 mg/dL — ABNORMAL HIGH (ref 6–23)
CALCIUM: 7.9 mg/dL — AB (ref 8.4–10.5)
CO2: 16 mEq/L — ABNORMAL LOW (ref 19–32)
Chloride: 113 mEq/L — ABNORMAL HIGH (ref 96–112)
Creatinine, Ser: 3.67 mg/dL — ABNORMAL HIGH (ref 0.50–1.10)
GFR calc Af Amer: 15 mL/min — ABNORMAL LOW (ref >90)
GFR, EST NON AFRICAN AMERICAN: 13 mL/min — AB (ref >90)
GLUCOSE: 84 mg/dL (ref 70–99)
Potassium: 4.5 mEq/L (ref 3.7–5.3)
Sodium: 142 mEq/L (ref 137–147)

## 2013-06-15 LAB — CBC
HCT: 20.8 % — ABNORMAL LOW (ref 36.0–46.0)
Hemoglobin: 6.5 g/dL — CL (ref 12.0–15.0)
MCH: 25.9 pg — AB (ref 26.0–34.0)
MCHC: 31.3 g/dL (ref 30.0–36.0)
MCV: 82.9 fL (ref 78.0–100.0)
PLATELETS: 58 10*3/uL — AB (ref 150–400)
RBC: 2.51 MIL/uL — ABNORMAL LOW (ref 3.87–5.11)
RDW: 20.4 % — ABNORMAL HIGH (ref 11.5–15.5)
WBC: 3.8 10*3/uL — ABNORMAL LOW (ref 4.0–10.5)

## 2013-06-15 LAB — PREPARE RBC (CROSSMATCH)

## 2013-06-15 LAB — HEPARIN LEVEL (UNFRACTIONATED): Heparin Unfractionated: 0.29 IU/mL — ABNORMAL LOW (ref 0.30–0.70)

## 2013-06-15 LAB — MAGNESIUM: Magnesium: 1.8 mg/dL (ref 1.5–2.5)

## 2013-06-15 MED ORDER — ENSURE COMPLETE PO LIQD
237.0000 mL | Freq: Three times a day (TID) | ORAL | Status: DC
  Administered 2013-06-15 – 2013-06-16 (×3): 237 mL via ORAL

## 2013-06-15 MED ORDER — CALCITRIOL 0.5 MCG PO CAPS
1.0000 ug | ORAL_CAPSULE | Freq: Every day | ORAL | Status: DC
  Administered 2013-06-15 – 2013-06-20 (×6): 1 ug via ORAL
  Filled 2013-06-15 (×6): qty 2

## 2013-06-15 MED ORDER — CINACALCET HCL 30 MG PO TABS
30.0000 mg | ORAL_TABLET | Freq: Every day | ORAL | Status: DC
Start: 2013-06-15 — End: 2013-06-20
  Administered 2013-06-15 – 2013-06-20 (×6): 30 mg via ORAL
  Filled 2013-06-15 (×8): qty 1

## 2013-06-15 MED ORDER — HEPARIN BOLUS VIA INFUSION
2500.0000 [IU] | Freq: Once | INTRAVENOUS | Status: AC
  Administered 2013-06-15: 2500 [IU] via INTRAVENOUS
  Filled 2013-06-15: qty 2500

## 2013-06-15 MED ORDER — ACETAMINOPHEN 160 MG/5ML PO SOLN
650.0000 mg | Freq: Three times a day (TID) | ORAL | Status: DC | PRN
  Administered 2013-06-20: 650 mg via ORAL
  Filled 2013-06-15 (×3): qty 20.3

## 2013-06-15 MED ORDER — DARBEPOETIN ALFA-POLYSORBATE 200 MCG/0.4ML IJ SOLN
200.0000 ug | INTRAMUSCULAR | Status: DC
  Administered 2013-06-15: 200 ug via SUBCUTANEOUS
  Filled 2013-06-15 (×2): qty 0.4

## 2013-06-15 MED ORDER — HEPARIN (PORCINE) IN NACL 100-0.45 UNIT/ML-% IJ SOLN
1000.0000 [IU]/h | INTRAMUSCULAR | Status: DC
  Administered 2013-06-15: 600 [IU]/h via INTRAVENOUS
  Administered 2013-06-16 – 2013-06-18 (×3): 800 [IU]/h via INTRAVENOUS
  Administered 2013-06-19: 900 [IU]/h via INTRAVENOUS
  Administered 2013-06-20: 1000 [IU]/h via INTRAVENOUS
  Filled 2013-06-15 (×9): qty 250

## 2013-06-15 NOTE — Progress Notes (Signed)
ANTICOAGULATION CONSULT NOTE - F/U Consult  Pharmacy Consult for heparin Indication: DVT  Allergies  Allergen Reactions  . Cephalosporins Diarrhea and Nausea Only  . Erythromycin Diarrhea and Nausea Only  . Fexofenadine-Pseudoephed Er Other (See Comments)    dizzy    Patient Measurements: Height: 5\' 3"  (160 cm) Weight: 100 lb (45.36 kg) IBW/kg (Calculated) : 52.4 Heparin Dosing Weight: 45kg  Vital Signs: Temp: 98.3 F (36.8 C) (04/18 2044) Temp src: Oral (04/18 2044) BP: 110/74 mmHg (04/18 2044) Pulse Rate: 99 (04/18 2044)  Labs:  Recent Labs  06/13/13 0500 06/13/13 0803 06/14/13 0800 06/15/13 0334 06/15/13 2057  HGB 5.9*  --  7.9* 6.5*  --   HCT 19.0*  --  25.2* 20.8*  --   PLT 64*  --  59* 58*  --   APTT  --  29  --   --   --   LABPROT  --  16.6*  --   --   --   INR  --  1.38  --   --   --   HEPARINUNFRC  --   --   --   --  0.29*  CREATININE 4.24*  --  3.96* 3.67*  --     Estimated Creatinine Clearance: 12.6 ml/min (by C-G formula based on Cr of 3.67).   Medical History: Past Medical History  Diagnosis Date  . Chronic kidney disease   . Anemia   . Stroke   . Hypertension   . Hypercalcemia   . Hypomagnesemia   . Complications of transplanted kidney   . Chronic kidney disease, stage IV (severe)   . Loss of weight   . Pancytopenia   . Adverse effect of immunosuppressive drug     Assessment: 32 yof admitted with anemia/pancytopenia currently receiving transfusion. Pt also noted to be s/p kidney transplant in 2005 and continues on anti-rejection medications.   Dopplers positive for DVT. FMTS has discussed situation with heme and new orders received to start IV heparin for treatment of R LE DVT. PLTC low at 58. Will watch for s/s of bleeding closely with low plt count, will likely aim for lower end of therapeutic range for heparin.   Heparin level 0.29 almost to goal range.  Goal of Therapy:  Heparin level 0.3-0.7 units/ml Monitor platelets by  anticoagulation protocol: Yes   Plan:  Increase IV heparin slightly to 700 units/hr Daily heparin level and CBC  Jaliah Foody S. Alford Highland, PharmD, [facility] Clinical Staff Pharmacist Pager [phone]  06/15/2013 9:39 PM

## 2013-06-15 NOTE — Progress Notes (Signed)
Admit: 06/12/2013 LOS: 3  48F CKD4T (ddKT 2005, on MMF/Tac, followed by [facility]) admit with anemia/pancytopenia, 40lb weight loss.    Subjective:  No events o/n  Hb back down to 6.5 today Seen by hematology yesterday Pt had been attending weekly procrit shots uptitrated most recently to [place].  Historically attendance had been intermittent but had been consistently attending since   04/17 0701 - 04/18 0700 In: [id number] [P.O.:739] Out: 375 [Urine:375]  Filed Weights   06/13/13 0750  Weight: 45.36 kg (100 lb)    Current meds: reviewed  Current Labs: reviewed    Physical Exam:  Blood pressure 84/46, pulse 86, temperature 98.8 F (37.1 C), temperature source Oral, resp. rate 16, height 5\' 3"  (1.6 m), weight 45.36 kg (100 lb), SpO2 97.00%. NAD RRR CTAB No LEE Nonfocal, aaox3 NCAT EOMI No rashes/lesions S/nt/nd  Assessment 1. Worsened Anemia / Pancytopenia; B12 replete. FOBT neg x1.  Seems hyporesponsive to Epo 2. 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 If contributign to hyporesponsiveness 4. ? Hx/o CMV in peritransplant period 5. Hypomagnesemia likely 2/2 Tac  Plan 1. Inc calcitriol to 18mc daily 2. Start sensipar 30mg  daily 3. Make sure Ca given in between meals 4. Change over to aranesp 273mcg qWk 5. Agree with transfusion 6. Next Monday consider talking to tplt center about changing off of MMF  7. CMV pending 8. I favor BM biopsy at this time given acute decline despite rec high dose Epo with TCP as well, defer to hematology.  This seems more than Anemia of CKD.    Pearson Grippe MD 06/15/2013, 8:48 AM   Recent Labs Lab 06/12/13 1500  06/13/13 0500 06/14/13 0800 06/15/13 0334  NA 143  < > 143 144 142  K 3.6*  < > 3.5* 4.3 4.5  CL 109  < > 109 115* 113*  CO2 14*  < > 15* 15* 16*  GLUCOSE 95  < > 88 89 84  BUN 66*  < > 65* 54* 51*  CREATININE 4.41*  < > 4.24* 3.96* 3.67*  CALCIUM 7.3*  < > 7.0* 8.0* 7.9*  PHOS  1.5*  --   --   --   --   < > = values in this interval not displayed.  Recent Labs Lab 06/13/13 0500 06/14/13 0800 06/15/13 0334  WBC 3.1* 3.6* 3.8*  HGB 5.9* 7.9* 6.5*  HCT 19.0* 25.2* 20.8*  MCV 83.7 82.1 82.9  PLT 64* 59* 58*

## 2013-06-15 NOTE — Progress Notes (Signed)
Patient [id number] [place]      DOB: November 02, 1960      [id number]   Palliative Medicine Team at [facility] Progress Note    Subjective: [name] looks a little stronger today, but blood counts are down again.  She shared with me that she tried to talk with her son yesterday about her current situation.  She states he is usually her surrogate, but currently has been telling her she needs to fight for her grandchildren and children sake.  She is slightly disappointed that he is not in tune with her current state of health.  She would not open further about advanced care planning.     Filed Vitals:   06/15/13 1315  BP:   Pulse:   Temp: 98.5 F (36.9 C)  Resp:    Physical exam:   General: moderate distress related to left rib pain. Patient states doesn't want pain meds as they may hurt her kidney function PERRL, EOMI, anicteric, mmm Chest : decreased, no rhonchi, rales or wheezing [id number], S1,S2 Abdomen: soft, not tender Ext: warm, frail Neuro: she feels foggy headed but presents as articulate and on target   Lab Results  Component Value Date   CREATININE 3.67* 06/15/2013   BUN 51* 06/15/2013   NA 142 06/15/2013   K 4.5 06/15/2013   CL 113* 06/15/2013   CO2 16* 06/15/2013   Lab Results  Component Value Date   WBC 3.8* 06/15/2013   HGB 6.5* 06/15/2013   HCT 20.8* 06/15/2013   MCV 82.9 06/15/2013   PLT 58* 06/15/2013    Assessment and plan: 53 yr old female with history of renal transplant on immunosuppressive drugs admitted with severe symptomatic anemia/pancytopenia, diarrhea and rib pain. She is severely malnourished.  We talked about desires for help with nutritional support , possible CIR stay if she qualifies, and her advanced care planning.  She is limiting her symptom management at this time to tylenol  1.  Full Code- will continue to be available to review this with the patient as she opens up.  2. Pain: patient accepting tylenol.  Still not open to lidocaine patch but  could be trialed.  Agree with expanding workup per oncology.  3.  Provided notebook for journaling and record keeping  4.  Patient requesting PT/OT consult already ordered. Wonder if CIR would be an option once medically stable?  5. Patient's immunosuppressant medications have been managed by her [place] Transplant program.  Her post transplant coordinator is [name]- Program number:[phone] ext. [phone].     Total time: 35 min  [name] Wales L. Lovena Le, MD MBA The Palliative Medicine Team at [facility] Phone: [phone] Pager: [phone]

## 2013-06-15 NOTE — Progress Notes (Addendum)
Family Medicine Teaching Service Daily Progress Note Intern Pager: [phone]  Patient name: [name] Medical record number: [id number] Date of birth: 01-19-1961 Age: 53 y.o. Gender: female  Primary Care Provider: Ulla Potash., MD Consultants: nephrology,  Code Status: full  Pt Overview and Major Events to Date:  4/15: Thrombocytopenia; Renal transplant (on anti-rejection medications) 4/16: Transfuse 1u; consult Nephrologist 4/17: C/s heme/onc for pancytopenia & DVT and palliative care for [place] 4/18: Hgb dec to 6.5; will transfuse another 1u RBC  Assessment and Plan: [name] is a 53 y.o. female presenting with anemia . PMH is significant for CKD (due to congential issue) s/p renal transplant at [facility], Stroke, Seizures and anemia.   # Pancytopenia& Weight loss: Anemia: Normocytic, Plts: stable ~58; WBC trending up 3.8; No baseline for comparison; Afebrile;  Avoid use of heparin if possible - Likely Chronic (likely secondary to CKD and anti-rejection medications) Hgb trending down from 8.5 (05/27/13); However concerning for additional subacute process as well, possible CA or viral etiology. Reports baseline (~9-10) until September; Reports ~40 lbs wt loss since 9/14; FOBT (-); Never had colonoscopy; past due for mammogram  - LDH 254, Fibrinogen wnl INR 1.38, PTT wnl/ Haptoglobin wnl; HIV neg; Smear: Spherocytes, elliptocytes -  Anemia: Normocytic,Hgb: 6.5 (4/18) < 7.9 (4/17) s/p 1u RBCs > 5.9 (4/17)  Retic Production Index = 0.08; very inadequate response; Iron and ferritin elevated w/ low UIBC   Transfuse 1u RBC today  Will be as judicious as possible with transfusing as she is currently being evaluated for second kidney transplant   Consulted Nephrologist; Discussed pt with Transplant doctor at [place]: pending  Renal decreased Cellcept and Prograft  Check Quantiferon Gold, EBV, CMV - Hematology consulted  SPEP, UPEP, IFE   Neupogen until ANC>1  and RBC transfusion if Hb <8 or acutely bleeding.   Supportive transfusion may berecommended if no underlying risk, if platelets less than 10,000   Under go Colonoscopy, PAP, Mammogram - Palliative care c/s - Nutrition c/s: Continue Facilities manager  # DVT - Ddimer 5.09; LE dopplers: DVT noted in the saphenofemoral junction that extends into the common femoral vein - Currently not treating in setting of pancytopenia and possible CA - Starting therapeutic Heparin per Heme/Onc recs  # Rib Pain - SHPTH/renal osteodystrophy- likely source of increased Alk phos and LDH, cont with vit D.  - CXR c/w renal osteodystrophy changes - consider Lidocaine patch - Heating pad - Continue Vicodin  #CKD s/p renal transplant  - Renal fcn appears at baseline currently; Cr 4.29  - No electrolyte abnormalities; will continue to monitor  - Continue anti-rejection medications as above - Renal managing   #Hypomagnesium; Resolved  - continue oral mag - monitor and replace as needed; 1.8 (4/18)  # Stroke & Seizures  - Reports stroke and subsequent seizures w/o residual deficits after episode of hypotension  - No symptoms, seizures or medications since   # Weakness - likely due to CKD and above problems - PT/OT to eval/treat for weakness  FEN/GI: SLIV; Reg diet; Ensure Prophylaxis: On therapeutic heparin as above    Disposition: Admit for observation; discharge pending nephrology recommendations  Subjective:  Still endorsing fatigue, weakness, and rib pains, but no SOB leg/groin pains. Denies any melena or hematochezia. No hematuria.   Objective: Temp:  [98.2 F (36.8 C)-99.5 F (37.5 C)] 98.5 F (36.9 C) (04/18 1130) Pulse Rate:  [84-94] 86 (04/18 0806) Resp:  [14-18] 17 (04/18 1130) BP: (  83-122)/(42-75) 96/64 mmHg (04/18 1130) SpO2:  [97 %-100 %] 100 % (04/18 1130) Physical Exam: General: Thin frail Female; NAD. Appears fatigued  HEENT: MMM,  Cardiovascular: RRR, no m/r/g; Cap refill <  3 secs  Respiratory: CTAB, normal resp effort  Abdomen: SNTND; BS+; No masses, RUQ hernia at site of previous incision  Extremities: LE cool but pulses 2+; No edema  MSK: Lateral lower rib tenderness b/l   Laboratory:  Recent Labs Lab 06/13/13 0500 06/14/13 0800 06/15/13 0334  WBC 3.1* 3.6* 3.8*  HGB 5.9* 7.9* 6.5*  HCT 19.0* 25.2* 20.8*  PLT 64* 59* 58*    Recent Labs Lab 06/13/13 0500 06/14/13 0800 06/15/13 0334  NA 143 144 142  K 3.5* 4.3 4.5  CL 109 115* 113*  CO2 15* 15* 16*  BUN 65* 54* 51*  CREATININE 4.24* 3.96* 3.67*  CALCIUM 7.0* 8.0* 7.9*  PROT 5.0*  --   --   BILITOT 0.3  --   --   ALKPHOS 383*  --   --   ALT 7  --   --   AST 12  --   --   GLUCOSE 88 89 84   Iron/TIBC/Ferritin    Component Value Date/Time   IRON 180* 06/12/2013 1618   TIBC NOT CALC 06/12/2013 1618   FERRITIN 489* 06/12/2013 1618   B12: 734 Folate: 8.4 Retic 0.4%; Count 11.4  Trop: Neg FOBT: Neg  Imaging/Diagnostic Tests: CXR FINDINGS: 06/12/13  The heart is mildly enlarged. There is no evidence of pulmonary  edema, consolidation, pneumothorax, nodule or pleural fluid. Lateral  projection shows a watch diet appearance of the thoracic vertebral  bodies suggestive of renal osteodystrophy. No fractures are  identified.  IMPRESSION:  No acute findings. 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   Phill Myron, MD 06/15/2013, 12:19 PM PGY-1, [place] Intern pager: [phone], text pages welcome

## 2013-06-15 NOTE — Progress Notes (Signed)
CRITICAL VALUE ALERT  Critical value received:  Hemoglobin 6.5  Date of notification:  06/15/2013  Time of notification:  0420  Critical value read back:yes  Nurse who received alert: Ananias Pilgrim  MD notified (1st page):  FMTS  Time of first page:  0510  MD notified (2nd page):  Time of second page:  Responding MD:  Dr. Caryl Bis  Time MD responded:  [phone]

## 2013-06-15 NOTE — Progress Notes (Signed)
FMTS Attending Daily Note:  [name] Sabal MD  [phone] pager  Family Practice pager:  [phone] I have seen and examined this patient and have reviewed their chart. I have discussed this patient with the resident. I agree with the resident's findings, assessment and care plan.  Additionally:  Hgb dropping.  Agree with transfusion.  Will ensure PT has been consulted.  Patient can get OOB to chair.  Need to work as much as possible on regaining strength.  She is leaning towards BM bx.    [name] Reasons, MD 06/15/2013 1:16 PM

## 2013-06-15 NOTE — Progress Notes (Signed)
ANTICOAGULATION CONSULT NOTE - Initial Consult  Pharmacy Consult for heparin Indication: DVT  Allergies  Allergen Reactions  . Cephalosporins Diarrhea and Nausea Only  . Erythromycin Diarrhea and Nausea Only  . Fexofenadine-Pseudoephed Er Other (See Comments)    dizzy    Patient Measurements: Height: 5\' 3"  (160 cm) Weight: 100 lb (45.36 kg) IBW/kg (Calculated) : 52.4 Heparin Dosing Weight: 45kg  Vital Signs: Temp: 98.5 F (36.9 C) (04/18 1315) Temp src: Oral (04/18 1315) BP: 109/73 mmHg (04/18 1310) Pulse Rate: 91 (04/18 1310)  Labs:  Recent Labs  06/13/13 0500 06/13/13 0803 06/14/13 0800 06/15/13 0334  HGB 5.9*  --  7.9* 6.5*  HCT 19.0*  --  25.2* 20.8*  PLT 64*  --  59* 58*  APTT  --  29  --   --   LABPROT  --  16.6*  --   --   INR  --  1.38  --   --   CREATININE 4.24*  --  3.96* 3.67*    Estimated Creatinine Clearance: 12.6 ml/min (by C-G formula based on Cr of 3.67).   Medical History: Past Medical History  Diagnosis Date  . Chronic kidney disease   . Anemia   . Stroke   . Hypertension   . Hypercalcemia   . Hypomagnesemia   . Complications of transplanted kidney   . Chronic kidney disease, stage IV (severe)   . Loss of weight   . Pancytopenia   . Adverse effect of immunosuppressive drug     Assessment: 89 yof admitted with anemia/pancytopenia currently receiving transfusion. Pt also noted to be s/p kidney transplant in 2005 and continues on anti-rejection medications.   Dopplers positive for DVT. FMTS has discussed situation with heme and new orders received to start IV heparin for treatment of R LE DVT. PLTC low at 58. Will watch for s/s of bleeding closely with low plt count, will likely aim for lower end of therapeutic range for heparin.   Goal of Therapy:  Heparin level 0.3-0.7 units/ml Monitor platelets by anticoagulation protocol: Yes   Plan:  Give 2500 units bolus x 1 Start heparin infusion at 600 units/hr Check anti-Xa level in 6  hours and daily while on heparin Continue to monitor H&H and platelets  Erin Hearing PharmD., BCPS Clinical Pharmacist Pager [phone] 06/15/2013 2:03 PM

## 2013-06-15 NOTE — Progress Notes (Signed)
PT Cancellation Note  Patient Details Name: [name] MRN: [id number] DOB: 1960/12/31   Cancelled Treatment:    Reason Eval/Treat Not Completed: Patient not medically ready.  Patient with + DVT in RLE, anticoagulation not started yet.  Hgb 6.5.  Will hold PT today and return tomorrow for PT evaluation as appropriate.   Despina Pole 06/15/2013, 3:35 PM Carita Pian. Sanjuana Kava, [place] Pager [phone]

## 2013-06-16 LAB — TYPE AND SCREEN
ABO/RH(D): A NEG
ABO/RH(D): A NEG
Antibody Screen: NEGATIVE
Antibody Screen: NEGATIVE
UNIT DIVISION: 0
Unit division: 0
Unit division: 0
Unit division: 0

## 2013-06-16 LAB — CBC
HEMATOCRIT: 25.7 % — AB (ref 36.0–46.0)
HEMATOCRIT: 25.3 % — AB (ref 36.0–46.0)
HEMOGLOBIN: 8.2 g/dL — AB (ref 12.0–15.0)
Hemoglobin: 8.2 g/dL — ABNORMAL LOW (ref 12.0–15.0)
MCH: 26.7 pg (ref 26.0–34.0)
MCH: 26.8 pg (ref 26.0–34.0)
MCHC: 31.9 g/dL (ref 30.0–36.0)
MCHC: 32.4 g/dL (ref 30.0–36.0)
MCV: 82.7 fL (ref 78.0–100.0)
MCV: 83.7 fL (ref 78.0–100.0)
PLATELETS: 62 10*3/uL — AB (ref 150–400)
Platelets: 59 10*3/uL — ABNORMAL LOW (ref 150–400)
RBC: 3.07 MIL/uL — AB (ref 3.87–5.11)
RBC: 3.06 MIL/uL — AB (ref 3.87–5.11)
RDW: 18.3 % — ABNORMAL HIGH (ref 11.5–15.5)
RDW: 19 % — ABNORMAL HIGH (ref 11.5–15.5)
WBC: 4.5 10*3/uL (ref 4.0–10.5)
WBC: 4.7 10*3/uL (ref 4.0–10.5)

## 2013-06-16 LAB — BASIC METABOLIC PANEL
BUN: 50 mg/dL — AB (ref 6–23)
CHLORIDE: 109 meq/L (ref 96–112)
CO2: 15 meq/L — AB (ref 19–32)
CREATININE: 3.47 mg/dL — AB (ref 0.50–1.10)
Calcium: 7.9 mg/dL — ABNORMAL LOW (ref 8.4–10.5)
GFR calc Af Amer: 16 mL/min — ABNORMAL LOW (ref >90)
GFR calc non Af Amer: 14 mL/min — ABNORMAL LOW (ref >90)
GLUCOSE: 84 mg/dL (ref 70–99)
Potassium: 4.9 mEq/L (ref 3.7–5.3)
Sodium: 137 mEq/L (ref 137–147)

## 2013-06-16 LAB — HEPARIN LEVEL (UNFRACTIONATED): Heparin Unfractionated: 0.3 IU/mL (ref 0.30–0.70)

## 2013-06-16 LAB — EPSTEIN BARR VRS(EBV DNA BY PCR): EBV DNA QN by PCR: <200 copies/mL

## 2013-06-16 LAB — MAGNESIUM: MAGNESIUM: 1.7 mg/dL (ref 1.5–2.5)

## 2013-06-16 NOTE — Progress Notes (Addendum)
Patient [id number] [place]      DOB: 1961/01/23      [id number]   The patient up in chair, eating lunch she states she feels better today. We've agreed to regroup in the a.m. regarding her goals.  Jerard Bays L. Lovena Le, MD MBA The Palliative Medicine Team at [facility] Phone: [phone] Pager: [phone]

## 2013-06-16 NOTE — Progress Notes (Signed)
Admit: 06/12/2013 LOS: 4  56F CKD4T (ddKT 2005, on MMF/Tac, followed by [facility]) admit with anemia/pancytopenia, 40lb weight loss.    Subjective:  Acute DVT at R saphenofemoral junction now on hep gtt Pt feels well overall No CP/SOB Rec Aranesp last night Called lab re CMV quant PCR blood. Though ordered no specimen being processed.   04/18 0701 - 04/19 0700 In: 1029.5 [P.O.:520; I.V.:159.5; Blood:350] Out: 450 [Urine:450]  Filed Weights   06/13/13 0750  Weight: 45.36 kg (100 lb)    Current meds: reviewed  Current Labs: reviewed    Physical Exam:  Blood pressure 102/69, pulse 91, temperature 97.5 F (36.4 C), temperature source Axillary, resp. rate 13, height 5\' 3"  (1.6 m), weight 45.36 kg (100 lb), SpO2 99.00%. NAD RRR CTAB No LEE Nonfocal, aaox3 NCAT EOMI No rashes/lesions S/nt/nd  Assessment 1. Worsened Anemia / Pancytopenia; B12 replete. FOBT neg x1.  Seems hyporesponsive to Epo 2. 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 If contributign to ESA hyporesponsiveness 4. ? Hx/o CMV in peritransplant period 5. Hypomagnesemia likely 2/2 Tac  Plan 1. Cont inc calcitriol dose + sensipar 30, Ca given in between meals 2. Cont on conversion to aranesp 268mcg qWk 3. Lab to add on CMV study or obtain new specimen 4. Next Monday consider talking to tplt center about changing off of MMF. Her post transplant coordinator is Patric Dykes- Program number:[phone]. [phone] 5. I favor BM biopsy at this time given acute decline despite rec high dose Epo with TCP, and new DVT, defer to hematology.  This seems more than Anemia of CKD.    Pearson Grippe MD 06/16/2013, 9:39 AM   Recent Labs Lab 06/12/13 1500  06/14/13 0800 06/15/13 0334 06/16/13 0305  NA 143  < > 144 142 137  K 3.6*  < > 4.3 4.5 4.9  CL 109  < > 115* 113* 109  CO2 14*  < > 15* 16* 15*  GLUCOSE 95  < > 89 84 84  BUN 66*  < > 54* 51* 50*  CREATININE  4.41*  < > 3.96* 3.67* 3.47*  CALCIUM 7.3*  < > 8.0* 7.9* 7.9*  PHOS 1.5*  --   --   --   --   < > = values in this interval not displayed.  Recent Labs Lab 06/15/13 0334 06/15/13 2345 06/16/13 0305  WBC 3.8* 4.5 4.7  HGB 6.5* 8.2* 8.2*  HCT 20.8* 25.3* 25.7*  MCV 82.9 82.7 83.7  PLT 58* 59* 62*

## 2013-06-16 NOTE — Progress Notes (Signed)
ANTICOAGULATION CONSULT NOTE - F/U Consult  Pharmacy Consult for heparin Indication: DVT  Allergies  Allergen Reactions  . Cephalosporins Diarrhea and Nausea Only  . Erythromycin Diarrhea and Nausea Only  . Fexofenadine-Pseudoephed Er Other (See Comments)    dizzy    Patient Measurements: Height: 5\' 3"  (160 cm) Weight: 100 lb (45.36 kg) IBW/kg (Calculated) : 52.4 Heparin Dosing Weight: 45kg  Vital Signs: Temp: 99 F (37.2 C) (04/19 0412) Temp src: Oral (04/19 0412) BP: 102/69 mmHg (04/19 0412) Pulse Rate: 91 (04/18 2339)  Labs:  Recent Labs  06/13/13 0803  06/14/13 0800 06/15/13 0334 06/15/13 2057 06/15/13 2345 06/16/13 0305  HGB  --   < > 7.9* 6.5*  --  8.2* 8.2*  HCT  --   < > 25.2* 20.8*  --  25.3* 25.7*  PLT  --   < > 59* 58*  --  59* 62*  APTT 29  --   --   --   --   --   --   LABPROT 16.6*  --   --   --   --   --   --   INR 1.38  --   --   --   --   --   --   HEPARINUNFRC  --   --   --   --  0.29*  --  0.30  CREATININE  --   --  3.96* 3.67*  --   --  3.47*  < > = values in this interval not displayed.  Estimated Creatinine Clearance: 13.3 ml/min (by C-G formula based on Cr of 3.47).   Medical History: Past Medical History  Diagnosis Date  . Chronic kidney disease   . Anemia   . Stroke   . Hypertension   . Hypercalcemia   . Hypomagnesemia   . Complications of transplanted kidney   . Chronic kidney disease, stage IV (severe)   . Loss of weight   . Pancytopenia   . Adverse effect of immunosuppressive drug     Assessment: 33 yof admitted with anemia/pancytopenia currently receiving transfusion. Pt also noted to be s/p kidney transplant in 2005 and continues on anti-rejection medications.   Dopplers positive for DVT. FMTS has discussed situation with heme and new orders received to start IV heparin for treatment of R LE DVT. PLTC low but up to 62 this am. Will watch for s/s of bleeding closely with low plt count, will continue to aim for lower end  of therapeutic range for heparin.   Heparin level 0.3 low end of goal range will increase rate slightly.  Goal of Therapy:  Heparin level 0.3-0.7 units/ml Monitor platelets by anticoagulation protocol: Yes   Plan:  Increase IV heparin slightly to 800 units/hr Daily heparin level and CBC  Erin Hearing PharmD., BCPS Clinical Pharmacist Pager [phone] 06/16/2013 7:12 AM

## 2013-06-16 NOTE — Evaluation (Signed)
Physical Therapy Evaluation Patient Details Name: [name] MRN: [id number] DOB: 12-15-1960 Today's Date: 06/16/2013   History of Present Illness  [name] is a 53 y.o. female presenting with anemia . PMH is significant for CKD (due to congential issue) s/p renal transplant at [facility], Stroke, Seizures and anemia.  Clinical Impression   Pt admitted with above, also with DVT, currently on anticoagulation. Pt currently with functional limitations due to the deficits listed below (see PT Problem List).  Pt will benefit from skilled PT to increase their independence and safety with mobility to allow discharge to the venue listed below.       Follow Up Recommendations CIR    Equipment Recommendations  3in1 (PT)    Recommendations for Other Services OT consult;Rehab consult     Precautions / Restrictions Precautions Precautions: Fall Precaution Comments: Suppressed immune system precautions      Mobility  Bed Mobility                  Transfers Overall transfer level: Needs assistance Equipment used: Rolling walker (2 wheeled) Transfers: Sit to/from Stand Sit to Stand: Mod assist         General transfer comment: Cues for hand placement and technique; Required physical assist to power-up and definite need for hands/UE push  Ambulation/Gait Ambulation/Gait assistance: Mod assist Ambulation Distance (Feet): 25 Feet Assistive device: Rolling walker (2 wheeled) Gait Pattern/deviations: Decreased step length - right;Decreased step length - left;Decreased stride length     General Gait Details: ANtalgic; Cued pt to push down through RW to unweigh painful hips WBing in stance; RW sized for optimal fit  Stairs            Wheelchair Mobility    Modified Rankin (Stroke Patients Only)       Balance Overall balance assessment: Needs assistance         Standing balance support: Bilateral upper extremity supported Standing balance-Leahy Scale:  Poor Standing balance comment: Dependent on UE support for balance in stance                             Pertinent Vitals/Pain Occasional shooting rib pain; bil hip pain with WBing; did not rate patient repositioned for comfort     Home Living Family/patient expects to be discharged to:: Private residence Living Arrangements: Spouse/significant other Available Help at Discharge: Family;Available PRN/intermittently Type of Home: House Home Access: Stairs to enter   Entrance Stairs-Number of Steps: 3 Home Layout: One level Home Equipment: Walker - 2 wheels      Prior Function           Comments: was completely independent until last winter; after renal transplant, pt has required the use of rW     Hand Dominance        Extremity/Trunk Assessment   Upper Extremity Assessment: Defer to OT evaluation           Lower Extremity Assessment: Generalized weakness (With noted bil hip pain in WBing)      Cervical / Trunk Assessment: Other exceptions  Communication   Communication: No difficulties  Cognition Arousal/Alertness: Awake/alert Behavior During Therapy: WFL for tasks assessed/performed Overall Cognitive Status: Within Functional Limits for tasks assessed                      General Comments      Exercises        Assessment/Plan  PT Assessment Patient needs continued PT services  PT Diagnosis Difficulty walking;Acute pain   PT Problem List Decreased strength;Decreased range of motion;Decreased activity tolerance;Decreased balance;Decreased mobility;Decreased knowledge of use of DME;Pain  PT Treatment Interventions DME instruction;Gait training;Stair training;Functional mobility training;Therapeutic activities;Therapeutic exercise;Balance training;Patient/family education   PT Goals (Current goals can be found in the Care Plan section) Acute Rehab PT Goals Patient Stated Goal: Very motivated to work and get back to  independence PT Goal Formulation: With patient Time For Goal Achievement: 06/30/13 Potential to Achieve Goals: Good    Frequency Min 3X/week   Barriers to discharge        Co-evaluation               End of Session Equipment Utilized During Treatment: Gait belt Activity Tolerance: Patient tolerated treatment well Patient left: in chair;with call bell/phone within reach;with nursing/sitter in room;with family/visitor present Nurse Communication: Mobility status         Time: [id number] PT Time Calculation (min): 31 min   Charges:   PT Evaluation $Initial PT Evaluation Tier I: 1 Procedure PT Treatments $Gait Training: 8-22 mins $Therapeutic Activity: 8-22 mins   PT G Codes:          [facility] 06/16/2013, 8:15 PM Roney Marion, [place] Pager [phone] Office [phone]

## 2013-06-16 NOTE — Progress Notes (Signed)
Family Medicine Teaching Service Daily Progress Note Intern Pager: [phone]  Patient name: [name] Medical record number: [id number] Date of birth: [birth year] Age: 53 y.o. Gender: female  Primary Care Provider: Ulla Potash., MD Consultants: nephrology,  Code Status: full  Pt Overview and Major Events to Date:  4/15: Thrombocytopenia; Renal transplant (on anti-rejection medications) 4/16: Transfuse 1u; consult Nephrologist 4/17: C/s heme/onc for pancytopenia & DVT and palliative care for [place] 4/18: Hgb dec to 6.5; will transfuse another 1u RBC  Assessment and Plan: [name] is a 53 y.o. female presenting with anemia . PMH is significant for CKD (due to congential issue) s/p renal transplant at [facility], Stroke, Seizures and anemia.   # Pancytopenia & Weight loss:  - Anemia: Normocytic, likely some component of anemia of chronic disease, but this has been somewhat extreme; s/p pRBC x2 this admission.  Current stable at 8.2. - Plts: stable around 60.  Monitor closely while on heparin - WBC trending up 4.7. Afebrile. - Likely Chronic (likely secondary to CKD and anti-rejection medications). However concerning for additional subacute process as well, possible CA or viral etiology. Reports baseline (~9-10) until September; Reports ~40 lbs wt loss since 9/14; FOBT (-); Never had colonoscopy; past due for mammogram. - LDH 254, Fibrinogen wnl INR 1.38, PTT wnl/ Haptoglobin wnl; HIV neg; Smear: Spherocytes, elliptocytes -  Anemia: Normocytic  Retic Production Index = 0.08; very inadequate response; Iron and ferritin elevated w/ low UIBC   Will be as judicious as possible with transfusing as she is currently being evaluated for second kidney transplant   Consulted Nephrologist; Discussed pt with Transplant doctor at [place]  Renal decreased Cellcept and Prograft  Quantiferon Gold, EBV, CMV pending - Hematology consulted  SPEP, UPEP, IFE pending  Neupogen until ANC>1 and  RBC transfusion if Hb <8 or acutely bleeding.   Supportive transfusion may be recommended if no underlying risk, if platelets less than 10,000   Under go Colonoscopy, PAP, Mammogram  Considering BM biopsy - patient unsure if she wants to proceed at this time - Palliative care c/s - Nutrition c/s: Continue Facilities manager  # DVT - Ddimer 5.09; LE dopplers: DVT noted in the saphenofemoral junction that extends into the common femoral vein - Continue therapeutic Heparin per Heme/Onc recs - will monitor platelet count and for bleeding closely  # Rib Pain - SHPTH/renal osteodystrophy- likely source of increased Alk phos and LDH, cont with vit D.  - CXR c/w renal osteodystrophy changes - consider Lidocaine patch - Heating pad - Continue Vicodin  #CKD s/p renal transplant  - Renal fcn appears at baseline currently; Cr 3.47 - has some acidosis but this appears to be chronic, on NaBicarb  - Continue anti-rejection medications as above - Renal managing   #Hypomagnesium; Resolved  - continue oral mag - monitor and replace as needed; 1.8 (4/18)  # Stroke & Seizures  - Reports stroke and subsequent seizures w/o residual deficits after episode of hypotension  - No symptoms, seizures or medications since   # Weakness - likely due to CKD and above problems - PT/OT to eval/treat for weakness  FEN/GI: SLIV; Reg diet; Ensure Prophylaxis: On therapeutic heparin as above    Disposition: Discharge pending nephrology/oncology recommendations  Subjective:  Reports feeling better this morning, states her appetite is coming back some.  Denies any pain, shortness of breath, bleeding.  Got some sleep last night.  No night sweats or fevers.  Objective: Temp:  [98.3 F (36.8  C)-99.4 F (37.4 C)] 99 F (37.2 C) (04/19 0412) Pulse Rate:  [86-99] 91 (04/18 2339) Resp:  [12-20] 13 (04/19 0412) BP: (96-110)/(64-74) 102/69 mmHg (04/19 0412) SpO2:  [98 %-100 %] 99 % (04/19 0412) Physical  Exam: General: Thin frail Female; NAD. Sitting on edge of bed. HEENT: MMM Cardiovascular: RRR, no m/r/g Respiratory: CTAB, normal resp effort   Extremities: No edema   Laboratory:  Recent Labs Lab 06/15/13 0334 06/15/13 2345 06/16/13 0305  WBC 3.8* 4.5 4.7  HGB 6.5* 8.2* 8.2*  HCT 20.8* 25.3* 25.7*  PLT 58* 59* 62*    Recent Labs Lab 06/13/13 0500 06/14/13 0800 06/15/13 0334 06/16/13 0305  NA 143 144 142 137  K 3.5* 4.3 4.5 4.9  CL 109 115* 113* 109  CO2 15* 15* 16* 15*  BUN 65* 54* 51* 50*  CREATININE 4.24* 3.96* 3.67* 3.47*  CALCIUM 7.0* 8.0* 7.9* 7.9*  PROT 5.0*  --   --   --   BILITOT 0.3  --   --   --   ALKPHOS 383*  --   --   --   ALT 7  --   --   --   AST 12  --   --   --   GLUCOSE 88 89 84 84   Iron/TIBC/Ferritin    Component Value Date/Time   IRON 180* 06/12/2013 1618   TIBC NOT CALC 06/12/2013 1618   FERRITIN 489* 06/12/2013 1618   B12: 734 Folate: 8.4 Retic 0.4%; Count 11.4  Trop: Neg FOBT: Neg  Imaging/Diagnostic Tests: CXR FINDINGS: 06/12/13  The heart is mildly enlarged. There is no evidence of pulmonary  edema, consolidation, pneumothorax, nodule or pleural fluid. Lateral  projection shows a watch diet appearance of the thoracic vertebral  bodies suggestive of renal osteodystrophy. No fractures are  identified.  IMPRESSION:  No acute findings. 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   Melony Overly, MD 06/16/2013, 8:12 AM PGY-3, [place] Intern pager: [phone], text pages welcome

## 2013-06-16 NOTE — Progress Notes (Signed)
FMTS Attending Daily Note:  Annabell Sabal MD  [phone] pager  Family Practice pager:  [phone] I have seen and examined this patient and have reviewed their chart. I have discussed this patient with the resident. I agree with the resident's findings, assessment and care plan.  Alveda Reasons, MD 06/16/2013 12:07 PM

## 2013-06-17 DIAGNOSIS — T451X5A Adverse effect of antineoplastic and immunosuppressive drugs, initial encounter: Secondary | ICD-10-CM

## 2013-06-17 LAB — TACROLIMUS LEVEL: Tacrolimus (FK506) - LabCorp: 7.2 ng/mL

## 2013-06-17 LAB — BASIC METABOLIC PANEL
BUN: 52 mg/dL — ABNORMAL HIGH (ref 6–23)
CHLORIDE: 110 meq/L (ref 96–112)
CO2: 16 meq/L — AB (ref 19–32)
Calcium: 7.4 mg/dL — ABNORMAL LOW (ref 8.4–10.5)
Creatinine, Ser: 3.53 mg/dL — ABNORMAL HIGH (ref 0.50–1.10)
GFR calc Af Amer: 16 mL/min — ABNORMAL LOW (ref >90)
GFR calc non Af Amer: 14 mL/min — ABNORMAL LOW (ref >90)
Glucose, Bld: 88 mg/dL (ref 70–99)
POTASSIUM: 4.5 meq/L (ref 3.7–5.3)
Sodium: 139 mEq/L (ref 137–147)

## 2013-06-17 LAB — MAGNESIUM: MAGNESIUM: 1.7 mg/dL (ref 1.5–2.5)

## 2013-06-17 LAB — CBC
HEMATOCRIT: 24.7 % — AB (ref 36.0–46.0)
Hemoglobin: 8 g/dL — ABNORMAL LOW (ref 12.0–15.0)
MCH: 26.9 pg (ref 26.0–34.0)
MCHC: 32.4 g/dL (ref 30.0–36.0)
MCV: 83.2 fL (ref 78.0–100.0)
PLATELETS: 68 10*3/uL — AB (ref 150–400)
RBC: 2.97 MIL/uL — AB (ref 3.87–5.11)
RDW: 19 % — AB (ref 11.5–15.5)
WBC: 4.7 10*3/uL (ref 4.0–10.5)

## 2013-06-17 LAB — HEPARIN LEVEL (UNFRACTIONATED): Heparin Unfractionated: 0.41 IU/mL (ref 0.30–0.70)

## 2013-06-17 LAB — PROTIME-INR
INR: 1.16 (ref 0.00–1.49)
Prothrombin Time: 14.6 seconds (ref 11.6–15.2)

## 2013-06-17 MED ORDER — PATIENT'S GUIDE TO USING COUMADIN BOOK
Freq: Once | Status: AC
  Administered 2013-06-17: 18:00:00
  Filled 2013-06-17: qty 1

## 2013-06-17 MED ORDER — WARFARIN SODIUM 2.5 MG PO TABS
2.5000 mg | ORAL_TABLET | Freq: Once | ORAL | Status: AC
  Filled 2013-06-17: qty 1

## 2013-06-17 MED ORDER — BOOST PLUS PO LIQD
237.0000 mL | Freq: Three times a day (TID) | ORAL | Status: DC
  Administered 2013-06-17 – 2013-06-20 (×3): 237 mL via ORAL
  Filled 2013-06-17 (×13): qty 237

## 2013-06-17 MED ORDER — WARFARIN VIDEO: Freq: Once | Status: DC

## 2013-06-17 MED ORDER — WARFARIN - PHARMACIST DOSING INPATIENT
Freq: Every day | Status: DC
  Administered 2013-06-17: 18:00:00

## 2013-06-17 MED ORDER — WARFARIN SODIUM 2.5 MG PO TABS
2.5000 mg | ORAL_TABLET | Freq: Once | ORAL | Status: AC
  Administered 2013-06-17: 2.5 mg via ORAL
  Filled 2013-06-17: qty 1

## 2013-06-17 NOTE — Progress Notes (Signed)
NUTRITION FOLLOW UP  DOCUMENTATION CODES  Per approved criteria  -Severe malnutrition in the context of chronic illness  -Underweight   Intervention:   1.  Supplements; Company secretary 2.  Meals/snacks; continue soy milk per pt preference. RD to order snacks for pt.   Nutrition Dx:   Malnutrition, ongoing  Monitor:   1.  Food/Beverage; pt meeting >/=90% estimated needs with tolerance. 2.  Wt/wt change; monitor trends  Assessment:   Pt with hx of CKD and chronic anemia s/p kidney transplant at [facility].  RD seen and assessed by RD yesterday.  Pt with severe wt loss, poor PO intake, and poor food tolerance as evidenced by chronic diarrhea.  Pt with low HgB and needs renal replacement therapy. Diet was liberalized to Regular. Pt continues with variable intake of small meals.  RD met with pt who states that she is feeling better and her appetite is coming back.  She states desire to gain weight, but also recounts struggle to get enough nutrition and tolerate many foods. Pt has a inclination for healthy foods.  She tolerates many things (i.e. High fiber foods) that would not be recommended for someone with diarrhea. Pt is willing to try snacks daily.  She would also like to try Ensure diluted with soy milk.   Height: Ht Readings from Last 1 Encounters:  06/13/13 '5\' 3"'  (1.6 m)    Weight Status:   Wt Readings from Last 1 Encounters:  06/13/13 100 lb (45.36 kg)    Re-estimated needs:  Kcal: 1400-1600  Protein: 67-80 grams  Fluid: >1.5 L/day  Skin: intact  Diet Order: General   Intake/Output Summary (Last 24 hours) at 06/17/13 1017 Last data filed at 06/17/13 0800  Gross per 24 hour  Intake    604 ml  Output      0 ml  Net    604 ml    Last BM: 4/19  Labs:   Recent Labs Lab 06/12/13 1500  06/15/13 0334 06/16/13 0305 06/17/13 0250  NA 143  < > 142 137 139  K 3.6*  < > 4.5 4.9 4.5  CL 109  < > 113* 109 110  CO2 14*  < > 16* 15* 16*  BUN 66*  < > 51* 50* 52*   CREATININE 4.41*  < > 3.67* 3.47* 3.53*  CALCIUM 7.3*  < > 7.9* 7.9* 7.4*  MG  --   < > 1.8 1.7 1.7  PHOS 1.5*  --   --   --   --   GLUCOSE 95  < > 84 84 88  < > = values in this interval not displayed.  CBG (last 3)  No results found for this basename: GLUCAP,  in the last 72 hours  Scheduled Meds: . calcitRIOL  1 mcg Oral Daily  . calcium carbonate  2 tablet Oral TID  . cinacalcet  30 mg Oral Q breakfast  . darbepoetin (ARANESP) injection - NON-DIALYSIS  200 mcg Subcutaneous Q Sat-1800  . feeding supplement (ENSURE COMPLETE)  237 mL Oral TID BM  . feeding supplement (RESOURCE BREEZE)  1 Container Oral TID BM  . magnesium oxide  400 mg Oral TID  . multivitamin with minerals  1 tablet Oral Daily  . mycophenolate  250 mg Oral BID  . potassium chloride SA  40 mEq Oral Daily  . sodium bicarbonate  1,300 mg Oral TID  . sodium chloride  3 mL Intravenous Q12H  . tacrolimus  1 mg Oral BID  Continuous Infusions: . heparin 800 Units/hr (06/17/13 0800)    Brynda Greathouse, MS RD LDN Clinical Inpatient Dietitian Pager: [phone] Weekend/After hours pager: [phone]

## 2013-06-17 NOTE — Progress Notes (Addendum)
ANTICOAGULATION CONSULT NOTE - F/U Consult  Pharmacy Consult for heparin>>warfarin  Indication: DVT  Allergies  Allergen Reactions  . Cephalosporins Diarrhea and Nausea Only  . Erythromycin Diarrhea and Nausea Only  . Fexofenadine-Pseudoephed Er Other (See Comments)    dizzy    Patient Measurements: Height: 5\' 3"  (160 cm) Weight: 100 lb (45.36 kg) IBW/kg (Calculated) : 52.4 Heparin Dosing Weight: 45kg  Vital Signs: Temp: 98.5 F (36.9 C) (04/20 0823) Temp src: Oral (04/20 0823) BP: 104/64 mmHg (04/20 0823) Pulse Rate: 135 (04/20 0900)  Labs:  Recent Labs  06/15/13 0334 06/15/13 2057 06/15/13 2345 06/16/13 0305 06/17/13 0250  HGB 6.5*  --  8.2* 8.2* 8.0*  HCT 20.8*  --  25.3* 25.7* 24.7*  PLT 58*  --  59* 62* 68*  HEPARINUNFRC  --  0.29*  --  0.30 0.41  CREATININE 3.67*  --   --  3.47* 3.53*    Estimated Creatinine Clearance: 13.1 ml/min (by C-G formula based on Cr of 3.53).   Medical History: Past Medical History  Diagnosis Date  . Chronic kidney disease   . Anemia   . Stroke   . Hypertension   . Hypercalcemia   . Hypomagnesemia   . Complications of transplanted kidney   . Chronic kidney disease, stage IV (severe)   . Loss of weight   . Pancytopenia   . Adverse effect of immunosuppressive drug     Assessment: 79 yof admitted with anemia/pancytopenia currently receiving transfusion. Pt also noted to be s/p kidney transplant in 2005 and continues on anti-rejection medications.   Dopplers positive for DVT. FMTS has discussed situation with heme and pt started on IV heparin for treatment of R LE DVT. PLTC low but up to 68 this am. Will watch for s/s of bleeding closely with low plt count, will continue to aim for lower end of therapeutic range for heparin.   Heparin level at goal (0.41) without bleeding complications, cbc stable. New orders this afternoon to start warfarin. Baseline INR on 4/16 was 1.38, will check today. Given low body weight, low pltc,  and currently receiving antirejection meds will start coumadin at low dose tonight.  Goal of Therapy:  Heparin level 0.3-0.7 units/ml INR goal 2-3 Monitor platelets by anticoagulation protocol: Yes   Plan:  Continue IV heparin at 800 units/hr Daily heparin level, INR and CBC Warfarin 2.5mg  tonight  Erin Hearing PharmD., BCPS Clinical Pharmacist Pager [phone] 06/17/2013 11:23 AM

## 2013-06-17 NOTE — Progress Notes (Signed)
Patient ID: Oluwadamilola Deliz, female   DOB: 06/06/1960, 53 y.o.   MRN: [id number] S:Feeling better, no new complaints O:BP 104/64  Pulse 80  Temp(Src) 98.5 F (36.9 C) (Oral)  Resp 14  Ht '5\' 3"'  (1.6 m)  Wt 45.36 kg (100 lb)  BMI 17.72 kg/m2  SpO2 99%  Intake/Output Summary (Last 24 hours) at 06/17/13 1005 Last data filed at 06/17/13 0800  Gross per 24 hour  Intake    604 ml  Output      0 ml  Net    604 ml   Intake/Output: I/O last 3 completed shifts: In: 1053.7 [P.O.:570; I.V.:483.7] Out: -   Intake/Output this shift:  Total I/O In: 10 [I.V.:10] Out: -  Weight change:  Gen:WD frail female in NAD CVS:no rub Resp:cta HCW:CBJSEG Ext:no edema   Recent Labs Lab 06/12/13 1500 06/12/13 1529 06/13/13 0500 06/14/13 0800 06/15/13 0334 06/16/13 0305 06/17/13 0250  NA 143 143 143 144 142 137 139  K 3.6* 4.0 3.5* 4.3 4.5 4.9 4.5  CL 109 112 109 115* 113* 109 110  CO2 14* 14* 15* 15* 16* 15* 16*  GLUCOSE 95 92 88 89 84 84 88  BUN 66* 66* 65* 54* 51* 50* 52*  CREATININE 4.41* 4.29* 4.24* 3.96* 3.67* 3.47* 3.53*  ALBUMIN 3.5  --  2.9*  --   --   --   --   CALCIUM 7.3* 7.4* 7.0* 8.0* 7.9* 7.9* 7.4*  PHOS 1.5*  --   --   --   --   --   --   AST  --   --  12  --   --   --   --   ALT  --   --  7  --   --   --   --    Liver Function Tests:  Recent Labs Lab 06/12/13 1500 06/13/13 0500  AST  --  12  ALT  --  7  ALKPHOS  --  383*  BILITOT  --  0.3  PROT  --  5.0*  ALBUMIN 3.5 2.9*   No results found for this basename: LIPASE, AMYLASE,  in the last 168 hours No results found for this basename: AMMONIA,  in the last 168 hours CBC:  Recent Labs Lab 06/14/13 0800 06/15/13 0334 06/15/13 2345 06/16/13 0305 06/17/13 0250  WBC 3.6* 3.8* 4.5 4.7 4.7  HGB 7.9* 6.5* 8.2* 8.2* 8.0*  HCT 25.2* 20.8* 25.3* 25.7* 24.7*  MCV 82.1 82.9 82.7 83.7 83.2  PLT 59* 58* 59* 62* 68*   Cardiac Enzymes: No results found for this basename: CKTOTAL, CKMB, CKMBINDEX, TROPONINI,  in the  last 168 hours CBG: No results found for this basename: GLUCAP,  in the last 168 hours  Iron Studies: No results found for this basename: IRON, TIBC, TRANSFERRIN, FERRITIN,  in the last 72 hours Studies/Results: No results found. . calcitRIOL  1 mcg Oral Daily  . calcium carbonate  2 tablet Oral TID  . cinacalcet  30 mg Oral Q breakfast  . darbepoetin (ARANESP) injection - NON-DIALYSIS  200 mcg Subcutaneous Q Sat-1800  . feeding supplement (ENSURE COMPLETE)  237 mL Oral TID BM  . feeding supplement (RESOURCE BREEZE)  1 Container Oral TID BM  . magnesium oxide  400 mg Oral TID  . multivitamin with minerals  1 tablet Oral Daily  . mycophenolate  250 mg Oral BID  . potassium chloride SA  40 mEq Oral Daily  . sodium bicarbonate  1,300 mg Oral TID  . sodium chloride  3 mL Intravenous Q12H  . tacrolimus  1 mg Oral BID    BMET    Component Value Date/Time   NA 139 06/17/2013 0250   K 4.5 06/17/2013 0250   CL 110 06/17/2013 0250   CO2 16* 06/17/2013 0250   GLUCOSE 88 06/17/2013 0250   BUN 52* 06/17/2013 0250   CREATININE 3.53* 06/17/2013 0250   CALCIUM 7.4* 06/17/2013 0250   CALCIUM 8.8 04/29/2013 1327   GFRNONAA 14* 06/17/2013 0250   GFRAA 16* 06/17/2013 0250   CBC    Component Value Date/Time   WBC 4.7 06/17/2013 0250   RBC 2.97* 06/17/2013 0250   RBC 2.85* 06/12/2013 1618   HGB 8.0* 06/17/2013 0250   HCT 24.7* 06/17/2013 0250   PLT 68* 06/17/2013 0250   MCV 83.2 06/17/2013 0250   MCH 26.9 06/17/2013 0250   MCHC 32.4 06/17/2013 0250   RDW 19.0* 06/17/2013 0250   LYMPHSABS 1.2 09/02/2010 1648   MONOABS 0.2 09/02/2010 1648   EOSABS 0.0 09/02/2010 1648   BASOSABS 0.0 09/02/2010 1648     Assessment/Plan:  1. Pancytopenia- in setting of advanced CKD (on ESA) due to allograft nephropathy and immunosuppressive therapy.  Platelets improving  1. Decreased cellcept and prograf dose at time of admission. 2. CMV DNA PCR as well as EBV ordered on 4/16, however it appears that it wasn't sent until  yesterday. Awaiting results. 3. Also need to r/o malignancy given longstanding immunosuppressive therapy and weight loss. (mammo, colonoscopy pending) 4. Hematology consult noted, however not seen yet this week.  Await their input and decision regarding proceeding with bone marrow biopsy to r/o malignancy/PTLD  5. Cont to follow and transfuse as needed 2. 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 She is not sure she would resume RRT if needed. May need to involve palliative care if her condition worsens as she has mentioned "getting affairs in order" 2. No significant change in overall renal function and actually improved over the last 2 years (although has lost more body weight) 3. Immunosuppressive therapy- will decrease cellcept to 250 bid and prograf to 15m bid and check prograf level (results still pending) 4. 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  No on aranesp and sensipar. 5. Protein malnutrition- increase supplementation and appreciate Nutritionist eval 6. SHPTH/renal osteodystrophy- likely source of increased Alk phos and LDH, cont with vit D. CXR c/w renal osteodystrophy changes 7. Metabolic acidosis- start bicarb po and follow 8. Hypokalemia- replete and increase po intake  JGovernor RooksColadonato

## 2013-06-17 NOTE — Progress Notes (Signed)
Rehab Admissions Coordinator Note:  Patient was screened by Cleatrice Burke for appropriateness for an Inpatient Acute Rehab Consult per PT recommendation. At this time, we are recommending Inpatient Rehab consult. Please place order.  Audelia Acton [facility] 06/17/2013, 10:42 AM  I can be reached at [phone].

## 2013-06-17 NOTE — Consult Note (Signed)
Physical Medicine and Rehabilitation Consult Reason for Consult: Deconditioning/multi-medical Referring Physician: Family medicine   HPI: [name] is a 53 y.o. 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 Patient has lived in [place] since 2012 and was independent up until September when generalized decline living with her boyfriend. Presented 06/12/2013 with generalized weakness, pancytopenia and recent weight loss. Noted decrease in hemoglobin from 10 on 05/12/2013 to 7.6-5.9. Chest x-ray was negative. Hematology service was consulted 06/14/2013 with workup ongoing question plan for bone marrow biopsy. Renal services follow up latest creatinine 3.47 question plan to resume RRT. Patient has been transfused with latest hemoglobin 8.0. Palliative care is invalid goals of care. Physical therapy evaluation completed 06/16/2013 with recommendations for physical medicine rehabilitation consult.   Review of Systems  Constitutional: Positive for weight loss and malaise/fatigue.  Gastrointestinal: Positive for constipation.  Neurological: Positive for weakness.  All other systems reviewed and are negative.  Past Medical History  Diagnosis Date  . Chronic kidney disease   . Anemia   . Stroke   . Hypertension   . Hypercalcemia   . Hypomagnesemia   . Complications of transplanted kidney   . Chronic kidney disease, stage IV (severe)   . Loss of weight   . Pancytopenia   . Adverse effect of immunosuppressive drug    Past Surgical History  Procedure Laterality Date  . Kidney transplant     No family history on file. Social History:  reports that she has never smoked. She has never used smokeless tobacco. She reports that she does not drink alcohol or use illicit drugs. Allergies:  Allergies  Allergen Reactions  . Cephalosporins Diarrhea and Nausea Only    . Erythromycin Diarrhea and Nausea Only  . Fexofenadine-Pseudoephed Er Other (See Comments)    dizzy   Medications Prior to Admission  Medication Sig Dispense Refill  . acetaminophen (TYLENOL) 500 MG tablet Take 500-1,000 mg by mouth every 6 (six) hours as needed for mild pain.      . calcitRIOL (ROCALTROL) 0.5 MCG capsule Take 0.5 mcg by mouth daily.      . calcium carbonate (HEALTHY MAMA TAME THE FLAME) 500 MG chewable tablet Chew 2 tablets by mouth 3 (three) times daily.       Marland Kitchen lactose free nutrition (BOOST) LIQD Take 237 mLs by mouth 3 (three) times daily between meals.      . magnesium oxide (MAG-OX) 400 MG tablet Take 400 mg by mouth 3 (three) times daily.       . Multiple Vitamin (MULTI-VITAMINS) TABS Take 1 tablet by mouth daily.      . mycophenolate (CELLCEPT) 500 MG tablet Take 500 mg by mouth 2 (two) times daily.      Marland Kitchen OVER THE COUNTER MEDICATION Take 1 tablet by mouth daily. Mega-red      . Potassium Chloride ER 20 MEQ TBCR Take 2 tablets by mouth daily.       . sodium bicarbonate 325 MG tablet Take 1,300 mg by mouth 3 (three) times daily.       . tacrolimus (PROGRAF) 1 MG capsule Take 2 mg by mouth 2 (two) times daily.         Home: Home Living Family/patient expects to be discharged to:: Private residence Living Arrangements: Spouse/significant other Available Help at Discharge: Family;Available PRN/intermittently Type of Home: House Home Access: Stairs to enter [employer]  of Steps: 3 Home Layout: One level Home Equipment: Walker - 2 wheels  Functional History: Prior Function Comments: was completely independent until last winter; after renal transplant, pt has required the use of rW Functional Status:  Mobility:   Transfers Overall transfer level: Needs assistance Equipment used: Rolling walker (2 wheeled) Transfers: Sit to/from Stand Sit to Stand: Mod assist General transfer comment: Cues for hand placement and technique; Required physical  assist to power-up and definite need for hands/UE push Ambulation/Gait Ambulation/Gait assistance: Mod assist Ambulation Distance (Feet): 25 Feet Assistive device: Rolling walker (2 wheeled) Gait Pattern/deviations: Decreased step length - right;Decreased step length - left;Decreased stride length General Gait Details: ANtalgic; Cued pt to push down through RW to unweigh painful hips WBing in stance; RW sized for optimal fit    ADL:    Cognition: Cognition Overall Cognitive Status: Within Functional Limits for tasks assessed Orientation Level: Oriented X4;Oriented to person;Oriented to place;Oriented to time;Oriented to situation Cognition Arousal/Alertness: Awake/alert Behavior During Therapy: WFL for tasks assessed/performed Overall Cognitive Status: Within Functional Limits for tasks assessed  Blood pressure 96/75, pulse 85, temperature 98.6 F (37 C), temperature source Oral, resp. rate 18, height _0  (1.6 m), weight 100 lb (45.36 kg), SpO2 100.00%. Physical Exam  Constitutional: She is oriented to person, place, and time.  61 year frail female in no acute distress sitting at the edge the bed.  HENT:  Head: Normocephalic.  Eyes: EOM are normal.  Neck: Normal range of motion. Neck supple. No tracheal deviation present. No thyromegaly present.  Cardiovascular: Normal rate and regular rhythm.   Respiratory: Effort normal and breath sounds normal. No respiratory distress.  GI: Soft. Bowel sounds are normal. She exhibits no distension.  Neurological: She is alert and oriented to person, place, and time. No cranial nerve deficit. She exhibits normal muscle tone. Coordination normal.  UE 4- deltoid, 4 bicep and tricep, 4+HI, HF 3+, KE 4-, ankles 4+. No sensory findings  Skin: Skin is warm and dry.  Psychiatric: She has a normal mood and affect.    Results for orders placed during the hospital encounter of 06/12/13 (from the past 24 hour(s))  CBC     Status: Abnormal    Collection Time    06/17/13  2:50 AM      Result Value Ref Range   WBC 4.7  4.0 - 10.5 K/uL   RBC 2.97 (*) 3.87 - 5.11 MIL/uL   Hemoglobin 8.0 (*) 12.0 - 15.0 g/dL   HCT 24.7 (*) 36.0 - 46.0 %   MCV 83.2  78.0 - 100.0 fL   MCH 26.9  26.0 - 34.0 pg   MCHC 32.4  30.0 - 36.0 g/dL   RDW 19.0 (*) 11.5 - 15.5 %   Platelets 68 (*) 150 - 400 K/uL  BASIC METABOLIC PANEL     Status: Abnormal   Collection Time    06/17/13  2:50 AM      Result Value Ref Range   Sodium 139  137 - 147 mEq/L   Potassium 4.5  3.7 - 5.3 mEq/L   Chloride 110  96 - 112 mEq/L   CO2 16 (*) 19 - 32 mEq/L   Glucose, Bld 88  70 - 99 mg/dL   BUN 52 (*) 6 - 23 mg/dL   Creatinine, Ser 3.53 (*) 0.50 - 1.10 mg/dL   Calcium 7.4 (*) 8.4 - 10.5 mg/dL   GFR calc non Af Amer 14 (*) >90 mL/min   GFR calc Af  Amer 16 (*) >90 mL/min  MAGNESIUM     Status: None   Collection Time    06/17/13  2:50 AM      Result Value Ref Range   Magnesium 1.7  1.5 - 2.5 mg/dL  HEPARIN LEVEL (UNFRACTIONATED)     Status: None   Collection Time    06/17/13  2:50 AM      Result Value Ref Range   Heparin Unfractionated 0.41  0.30 - 0.70 IU/mL   No results found.  Assessment/Plan: Diagnosis: deconditioning related to kidney disease and multiple medical issues above 1. Does the need for close, 24 hr/day medical supervision in concert with the patient's rehab needs make it unreasonable for this patient to be served in a less intensive setting? Yes 2. Co-Morbidities requiring supervision/potential complications: CKD, anemia, anorexia 3. Due to bladder management, bowel management, safety, skin/wound care, disease management, medication administration, pain management and patient education, does the patient require 24 hr/day rehab nursing? Yes 4. Does the patient require coordinated care of a physician, rehab nurse, PT (1-2 hrs/day, 5 days/week) and OT (1-2 hrs/day, 5 days/week) to address physical and functional deficits in the context of the above  medical diagnosis(es)? Yes Addressing deficits in the following areas: balance, endurance, locomotion, strength, transferring, bowel/bladder control, bathing, dressing, feeding, grooming, toileting and psychosocial support 5. Can the patient actively participate in an intensive therapy program of at least 3 hrs of therapy per day at least 5 days per week? Yes 6. The potential for patient to make measurable gains while on inpatient rehab is excellent 7. Anticipated functional outcomes upon discharge from inpatient rehab are modified independent  with PT, modified independent with OT, n/a with SLP. 8. Estimated rehab length of stay to reach the above functional goals is: 9-12 9. Does the patient have adequate social supports to accommodate these discharge functional goals? Yes 10. Anticipated D/C setting: Home 11. Anticipated post D/C treatments: [place] therapy 12. Overall Rehab/Functional Prognosis: excellent  RECOMMENDATIONS: This patient's condition is appropriate for continued rehabilitative care in the following setting: CIR Patient has agreed to participate in recommended program. Yes Note that insurance prior authorization may be required for reimbursement for recommended care.  Comment: Pt is extremely motivated and will do well in our program. Rehab Admissions Coordinator to follow up.  Thanks,  Meredith Staggers, MD, Mellody Drown     06/17/2013

## 2013-06-17 NOTE — Progress Notes (Addendum)
Family Medicine Teaching Service Daily Progress Note Intern Pager: [phone]  Patient name: [name] Medical record number: [id number] Date of birth: [birth year] Age: 53 y.o. Gender: female  Primary Care Provider: Ulla Potash., MD Consultants: nephrology,  Code Status: full  Pt Overview and Major Events to Date:  4/15: Thrombocytopenia; Renal transplant (on anti-rejection medications) 4/16: Transfuse 1u; consult Nephrologist 4/17: C/s heme/onc for pancytopenia & DVT and palliative care for [place] 4/18: Hgb dec to 6.5; will transfuse another 1u RBC 4/20: Hgb  8.0; WBC wnl; plts stable  Assessment and Plan: [name] is a 53 y.o. female presenting with anemia . PMH is significant for CKD (due to congential issue) s/p renal transplant at [facility], Stroke, Seizures and anemia.   # Pancytopenia & Weight loss: Improving. WBC wnl, Plts stable ~ 60; Hgb stable ~ 8 - Anemia: Normocytic, likely some component of anemia of chronic disease, but this has been somewhat extreme; s/p pRBC x2 this admission.  Current stable at 8.2. - WBC trending up & Afebrile. - Likely Chronic (likely secondary to CKD and anti-rejection medications). However concerning for additional subacute process as well, possible CA or viral etiology. Reports baseline (~9-10) until September; Reports ~40 lbs wt loss since 9/14; FOBT (-); Never had colonoscopy; past due for mammogram. - LDH 254, Fibrinogen wnl INR 1.38, PTT wnl/ Haptoglobin wnl; HIV neg; Smear: Spherocytes, elliptocytes -  Anemia: Normocytic  Retic Production Index = 0.08; very inadequate response; Iron and ferritin elevated w/ low UIBC   Will be as judicious as possible with transfusing as she is currently being evaluated for second kidney transplant   Consulted Nephrologist; Discussed pt with Transplant doctor at [facility]  Renal decreased Cellcept and Prograft  Quantiferon Gold, EBV, CMV pending - Hematology consulted  SPEP, UPEP, IFE  pending  Neupogen until ANC>1 and RBC transfusion if Hb <8 or acutely bleeding.   Supportive transfusion may be recommended if no underlying risk, if platelets less than 10,000   Under go Colonoscopy, PAP, Mammogram  Considering BM biopsy - patient unsure if she wants to proceed at this time - Palliative care c/s - Nutrition c/s: Continue Facilities manager  # DVT - Ddimer 5.09; LE dopplers: DVT noted in the saphenofemoral junction that extends into the common femoral vein - Continue therapeutic Heparin per Heme/Onc recs - will monitor platelet count and for bleeding closely  # Rib Pain: improving - SHPTH/renal osteodystrophy- likely source of increased Alk phos and LDH, cont with vit D.  - CXR c/w renal osteodystrophy changes - consider Lidocaine patch - Heating pad - Continue Vicodin  #CKD s/p renal transplant  - Renal fcn appears at baseline  - has some acidosis but this appears to be chronic, on NaBicarb  - Continue anti-rejection medications as above - Renal managing   #Hypomagnesium; Resolved  - continue oral mag - monitor and replace as needed; 1.8 (4/18)  # Stroke & Seizures  - Reports stroke and subsequent seizures w/o residual deficits after episode of hypotension  - No symptoms, seizures or medications since   # Weakness - likely due to CKD and above problems - PT recs CIR; OT to eval/treat for weakness  FEN/GI: SLIV; Reg diet; Ensure Prophylaxis: On therapeutic heparin as above    Disposition: Discharge pending nephrology/oncology recommendations  Subjective:  Feeling better this morning. Working with PT and getting stronger. Denies CP, SOB or rt leg pain.   Objective: Temp:  [98 F (36.7 C)-98.9 F (37.2 C)] 98.5  F (36.9 C) (04/20 0823) Pulse Rate:  [80] 80 (04/20 0823) Resp:  [14-20] 14 (04/20 0823) BP: (93-112)/(57-71) 104/64 mmHg (04/20 0823) SpO2:  [99 %-100 %] 99 % (04/20 0823) Physical Exam: General: Thin frail Female; NAD. Sitting on edge  of bed. HEENT: MMM Cardiovascular: RRR, no m/r/g Respiratory: CTAB, normal resp effort   Extremities: No edema  Laboratory:  Recent Labs Lab 06/15/13 2345 06/16/13 0305 06/17/13 0250  WBC 4.5 4.7 4.7  HGB 8.2* 8.2* 8.0*  HCT 25.3* 25.7* 24.7*  PLT 59* 62* 68*    Recent Labs Lab 06/13/13 0500  06/15/13 0334 06/16/13 0305 06/17/13 0250  NA 143  < > 142 137 139  K 3.5*  < > 4.5 4.9 4.5  CL 109  < > 113* 109 110  CO2 15*  < > 16* 15* 16*  BUN 65*  < > 51* 50* 52*  CREATININE 4.24*  < > 3.67* 3.47* 3.53*  CALCIUM 7.0*  < > 7.9* 7.9* 7.4*  PROT 5.0*  --   --   --   --   BILITOT 0.3  --   --   --   --   ALKPHOS 383*  --   --   --   --   ALT 7  --   --   --   --   AST 12  --   --   --   --   GLUCOSE 88  < > 84 84 88  < > = values in this interval not displayed. Iron/TIBC/Ferritin    Component Value Date/Time   IRON 180* 06/12/2013 1618   TIBC NOT CALC 06/12/2013 1618   FERRITIN 489* 06/12/2013 1618   B12: 734 Folate: 8.4 Retic 0.4%; Count 11.4  Trop: Neg FOBT: Neg  Imaging/Diagnostic Tests: CXR FINDINGS: 06/12/13  The heart is mildly enlarged. There is no evidence of pulmonary  edema, consolidation, pneumothorax, nodule or pleural fluid. Lateral  projection shows a watch diet appearance of the thoracic vertebral  bodies suggestive of renal osteodystrophy. No fractures are  identified.  IMPRESSION:  No acute findings. 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   James Joyner, MD 06/17/2013, 8:57 AM PGY-1, [facility] FPTS Intern pager: [phone], text pages welcome Seen and examined.  Discussed with Dr. Joyner.  Agree with his documentation and management.  We (the medical team) needs to come to a consensus about BM biopsy and long term anticoagulation.  Those are the issues keeping her in the hospital.  She is also quite debilitated and I believe CIRF is a great option if she qualifies.  We discussed that it is unusual for a renal  failure patient to need supplemental Mg and K - typically we avoid this in high stages of CKD.   

## 2013-06-18 ENCOUNTER — Encounter (HOSPITAL_COMMUNITY): Payer: Self-pay

## 2013-06-18 DIAGNOSIS — R5381 Other malaise: Secondary | ICD-10-CM

## 2013-06-18 LAB — UIFE/LIGHT CHAINS/TP QN, 24-HR UR
ALPHA 2 UR: DETECTED — AB
Albumin, U: DETECTED
Alpha 1, Urine: DETECTED — AB
Beta, Urine: DETECTED — AB
FREE KAPPA LT CHAINS, UR: 55.1 mg/dL — AB (ref 0.14–2.42)
FREE KAPPA/LAMBDA RATIO: 4.92 ratio (ref 2.04–10.37)
FREE LAMBDA LT CHAINS, UR: 11.2 mg/dL — AB (ref 0.02–0.67)
Gamma Globulin, Urine: DETECTED — AB
TOTAL PROTEIN, URINE-UPE24: 71.3 mg/dL

## 2013-06-18 LAB — PROTEIN ELECTROPH W RFLX QUANT IMMUNOGLOBULINS
ALPHA-1-GLOBULIN: 7.1 % — AB (ref 2.9–4.9)
Albumin ELP: 61.7 % (ref 55.8–66.1)
Alpha-2-Globulin: 12 % — ABNORMAL HIGH (ref 7.1–11.8)
BETA 2: 3.9 % (ref 3.2–6.5)
Beta Globulin: 4.2 % — ABNORMAL LOW (ref 4.7–7.2)
Gamma Globulin: 11.1 % (ref 11.1–18.8)
M-Spike, %: NOT DETECTED g/dL
TOTAL PROTEIN ELP: 4.9 g/dL — AB (ref 6.0–8.3)

## 2013-06-18 LAB — BASIC METABOLIC PANEL
BUN: 52 mg/dL — ABNORMAL HIGH (ref 6–23)
CO2: 18 mEq/L — ABNORMAL LOW (ref 19–32)
CREATININE: 3.51 mg/dL — AB (ref 0.50–1.10)
Calcium: 7.6 mg/dL — ABNORMAL LOW (ref 8.4–10.5)
Chloride: 112 mEq/L (ref 96–112)
GFR, EST AFRICAN AMERICAN: 16 mL/min — AB (ref >90)
GFR, EST NON AFRICAN AMERICAN: 14 mL/min — AB (ref >90)
Glucose, Bld: 85 mg/dL (ref 70–99)
Potassium: 4.5 mEq/L (ref 3.7–5.3)
Sodium: 142 mEq/L (ref 137–147)

## 2013-06-18 LAB — IGG, IGA, IGM
IGA: 128 mg/dL (ref 69–380)
IGG (IMMUNOGLOBIN G), SERUM: 668 mg/dL — AB (ref 690–1700)
IGM, SERUM: 40 mg/dL — AB (ref 52–322)

## 2013-06-18 LAB — CBC
HCT: 23.9 % — ABNORMAL LOW (ref 36.0–46.0)
Hemoglobin: 7.4 g/dL — ABNORMAL LOW (ref 12.0–15.0)
MCH: 26.1 pg (ref 26.0–34.0)
MCHC: 31 g/dL (ref 30.0–36.0)
MCV: 84.5 fL (ref 78.0–100.0)
PLATELETS: 64 10*3/uL — AB (ref 150–400)
RBC: 2.83 MIL/uL — ABNORMAL LOW (ref 3.87–5.11)
RDW: 19.2 % — AB (ref 11.5–15.5)
WBC: 4.2 10*3/uL (ref 4.0–10.5)

## 2013-06-18 LAB — MAGNESIUM: MAGNESIUM: 1.6 mg/dL (ref 1.5–2.5)

## 2013-06-18 LAB — HEPARIN LEVEL (UNFRACTIONATED)
HEPARIN UNFRACTIONATED: 0.51 [IU]/mL (ref 0.30–0.70)
Heparin Unfractionated: 0.24 IU/mL — ABNORMAL LOW (ref 0.30–0.70)

## 2013-06-18 LAB — PROTIME-INR
INR: 1.28 (ref 0.00–1.49)
Prothrombin Time: 15.7 seconds — ABNORMAL HIGH (ref 11.6–15.2)

## 2013-06-18 LAB — IMMUNOFIXATION ADD-ON

## 2013-06-18 MED ORDER — WARFARIN SODIUM 2.5 MG PO TABS
2.5000 mg | ORAL_TABLET | Freq: Once | ORAL | Status: AC
  Administered 2013-06-18: 2.5 mg via ORAL
  Filled 2013-06-18: qty 1

## 2013-06-18 NOTE — Progress Notes (Signed)
Clinical Social Work Department CLINICAL SOCIAL WORK PLACEMENT NOTE 06/18/2013  Patient:  [name],[name]  Account Number:  [id number] Admit date:  06/12/2013  Clinical Social Worker:  Ky Barban, Latanya Presser  Date/time:  06/18/2013 04:29 PM  Clinical Social Work is seeking post-discharge placement for this patient at the following level of care:   [place]   (*CSW will update this form in Epic as items are completed)   N/A-SNF back-up to CIR, referral made to all SNFs in [facility]  Patient/family provided with [place] Department of Clinical Social Work's list of facilities offering this level of care within the geographic area requested by the patient (or if unable, by the patient's family).  06/17/2013  Patient/family informed of their freedom to choose among providers that offer the needed level of care, that participate in Medicare, Medicaid or managed care program needed by the patient, have an available bed and are willing to accept the patient.  N/A-clinicals not sent to this SNF  Patient/family informed of [facility]' ownership interest in [facility], as well as of the fact that they are under no obligation to receive care at this facility.  PASARR submitted to EDS on 06/17/2013 PASARR number received from EDS on 06/17/2013  FL2 transmitted to all facilities in geographic area requested by pt/family on  06/17/2013 FL2 transmitted to all facilities within larger geographic area on   Patient informed that his/her managed care company has contracts with or will negotiate with  certain facilities, including the following:     Patient/family informed of bed offers received:  06/18/2013 Patient chooses bed at  Physician recommends and patient chooses bed at    Patient to be transferred to  on   Patient to be transferred to facility by   The following physician request were entered in Epic:   Additional Comments: Liaisons from SNFs that pt got bed  offers from came to pt's room to speak with her about placement. Insurance auth initiated by [employer], and pt is hopeful to go to CIR. CSW following for back-up in event CIR cannot take pt.   Ky Barban, MSW, [facility] Clinical Social Worker [phone]

## 2013-06-18 NOTE — Progress Notes (Signed)
Family Medicine Teaching Service Daily Progress Note Intern Pager: [phone]  Patient name: [name] Medical record number: [id number] Date of birth: 1960-03-06 Age: 53 y.o. Gender: female  Primary Care Provider: Ulla Potash., MD Consultants: nephrology,  Code Status: full  Pt Overview and Major Events to Date:  4/15: Thrombocytopenia; Renal transplant (on anti-rejection medications) 4/16: Transfuse 1u; consult Nephrologist 4/17: C/s heme/onc for pancytopenia & DVT and palliative care for [place] 4/18: Hgb dec to 6.5; will transfuse another 1u RBC 4/20: Hgb  8.0; WBC wnl; plts stable 4/21: Transferring to floor - awaiting CIR approval and hematology  Assessment and Plan: [name] is a 53 y.o. female presenting with anemia . PMH is significant for CKD (due to congential issue) s/p renal transplant at [facility], Stroke, Seizures and anemia.   # Pancytopenia & Weight loss: Improving. WBC wnl, Plts stable ~ 60; Hgb stable ~ 8 - Anemia: Normocytic, likely some component of anemia of chronic disease, but this has been somewhat extreme; s/p pRBC x2 this admission.  Current stable at 8.2. - WBC trending up & Afebrile. - Likely Chronic (likely secondary to CKD and anti-rejection medications). However concerning for additional subacute process as well, possible CA or viral etiology. Reports baseline (~9-10) until September; Reports ~40 lbs wt loss since 9/14; FOBT (-); Never had colonoscopy; past due for mammogram. - LDH 254, Fibrinogen wnl INR 1.38, PTT wnl/ Haptoglobin wnl; HIV neg; Smear: Spherocytes, elliptocytes -  Anemia: Normocytic  Retic Production Index = 0.08; very inadequate response; Iron and ferritin elevated w/ low UIBC   Will be as judicious as possible with transfusing as she is currently being evaluated for second kidney transplant   Consulted Nephrologist; Discussed pt with Transplant doctor at [place]  Renal decreased Cellcept and Prograft  Quantiferon Gold, EBV,  CMV pending  Renal signed off - Will continue to follow as outpatient - Hematology consulted  SPEP, UPEP, IFE pending  Neupogen until ANC>1 and RBC transfusion if Hb <8 or acutely bleeding.   Supportive transfusion may be recommended if no underlying risk, if platelets less than 10,000   Recommending Colonoscopy, PAP, Mammogram as outpatient  Will see her in clinic for INR checks and out pt managemetn - Palliative care c/s - Nutrition c/s: Company secretary  # DVT - Ddimer 5.09; LE dopplers: DVT noted in the saphenofemoral junction that extends into the common femoral vein - Transitioning to coumadin from Heparin per Heme/Onc recs - will monitor platelet count and for bleeding closely  # Rib Pain: improving - SHPTH/renal osteodystrophy- likely source of increased Alk phos and LDH, cont with vit D.  - CXR c/w renal osteodystrophy changes - Heating pad - Continue Vicodin  #CKD s/p renal transplant  - Renal fcn appears at baseline  - has some acidosis but this appears to be chronic, on NaBicarb  - Continue anti-rejection medications as above - Renal managing   #Hypomagnesium; Resolved  - continue oral mag - monitor and replace as needed; 1.8 (4/18)  # Stroke & Seizures  - Reports stroke and subsequent seizures w/o residual deficits after episode of hypotension  - No symptoms, seizures or medications since   # Weakness - likely due to CKD and above problems - PT recs CIR; OT to eval/treat for weakness - CIR- Awaiting Insurance approval   FEN/GI: SLIV; Reg diet; Ensure Prophylaxis: On therapeutic heparin as above    Disposition: Discharge pending CIR insurance approval   Subjective:  Feeling better this morning.  Working with PT and getting stronger. Denies CP, SOB or rt leg pain.   Objective: Temp:  [97.6 F (36.4 C)-99 F (37.2 C)] 98.5 F (36.9 C) (04/21 0718) Pulse Rate:  [77-98] 98 (04/21 0718) Resp:  [12-19] 19 (04/21 0718) BP: (90-110)/(54-75)  93/56 mmHg (04/21 0718) SpO2:  [100 %] 100 % (04/21 1146) Physical Exam: General: Thin frail Female; NAD. Sitting on edge of bed. HEENT: MMM Cardiovascular: RRR, no m/r/g Respiratory: CTAB, normal resp effort   Extremities: No edema  Laboratory:  Recent Labs Lab 06/16/13 0305 06/17/13 0250 06/18/13 0232  WBC 4.7 4.7 4.2  HGB 8.2* 8.0* 7.4*  HCT 25.7* 24.7* 23.9*  PLT 62* 68* 64*    Recent Labs Lab 06/13/13 0500  06/16/13 0305 06/17/13 0250 06/18/13 0232  NA 143  < > 137 139 142  K 3.5*  < > 4.9 4.5 4.5  CL 109  < > 109 110 112  CO2 15*  < > 15* 16* 18*  BUN 65*  < > 50* 52* 52*  CREATININE 4.24*  < > 3.47* 3.53* 3.51*  CALCIUM 7.0*  < > 7.9* 7.4* 7.6*  PROT 5.0*  --   --   --   --   BILITOT 0.3  --   --   --   --   ALKPHOS 383*  --   --   --   --   ALT 7  --   --   --   --   AST 12  --   --   --   --   GLUCOSE 88  < > 84 88 85  < > = values in this interval not displayed. Iron/TIBC/Ferritin    Component Value Date/Time   IRON 180* 06/12/2013 1618   TIBC NOT CALC 06/12/2013 1618   FERRITIN 489* 06/12/2013 1618   B12: 734 Folate: 8.4 Retic 0.4%; Count 11.4  Trop: Neg FOBT: Neg  Imaging/Diagnostic Tests: CXR FINDINGS: 06/12/13  The heart is mildly enlarged. There is no evidence of pulmonary  edema, consolidation, pneumothorax, nodule or pleural fluid. Lateral  projection shows a watch diet appearance of the thoracic vertebral  bodies suggestive of renal osteodystrophy. No fractures are  identified.  IMPRESSION:  No acute findings. 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   Phill Myron, MD 06/18/2013, 2:44 PM PGY-1, [place] Intern pager: [phone], text pages welcome

## 2013-06-18 NOTE — Progress Notes (Signed)
PT Cancellation Note  Patient Details Name: [name] MRN: [id number] DOB: 1960/07/11   Cancelled Treatment:    Reason Eval/Treat Not Completed: Medical issues which prohibited therapy. Noted pt currently subtherapeutic on Heparin for RLE DVT. Dose increased with next lab draw to be done ~12:30. Will follow-up this pm.   [name] [name] 06/18/2013, 10:46 AM Pager [phone]

## 2013-06-18 NOTE — Progress Notes (Signed)
ANTICOAGULATION CONSULT NOTE - F/U Consult  Pharmacy Consult for heparin>>warfarin  Indication: DVT  Allergies  Allergen Reactions  . Cephalosporins Diarrhea and Nausea Only  . Erythromycin Diarrhea and Nausea Only  . Fexofenadine-Pseudoephed Er Other (See Comments)    dizzy    Patient Measurements: Height: 5\' 3"  (160 cm) Weight: 100 lb (45.36 kg) IBW/kg (Calculated) : 52.4 Heparin Dosing Weight: 45kg  Vital Signs: Temp: 99 F (37.2 C) (04/21 0329) Temp src: Oral (04/21 0329) BP: 95/56 mmHg (04/21 0329) Pulse Rate: 88 (04/21 0329)  Labs:  Recent Labs  06/16/13 0305 06/17/13 0250 06/17/13 1443 06/18/13 0232  HGB 8.2* 8.0*  --  7.4*  HCT 25.7* 24.7*  --  23.9*  PLT 62* 68*  --  64*  LABPROT  --   --  14.6 15.7*  INR  --   --  1.16 1.28  HEPARINUNFRC 0.30 0.41  --  0.24*  CREATININE 3.47* 3.53*  --  3.51*    Estimated Creatinine Clearance: 13.1 ml/min (by C-G formula based on Cr of 3.51).   Medical History: Past Medical History  Diagnosis Date  . Chronic kidney disease   . Anemia   . Stroke   . Hypertension   . Hypercalcemia   . Hypomagnesemia   . Complications of transplanted kidney   . Chronic kidney disease, stage IV (severe)   . Loss of weight   . Pancytopenia   . Adverse effect of immunosuppressive drug     Assessment: 57 yof admitted with anemia/pancytopenia currently receiving transfusion. Pt also noted to be s/p kidney transplant in 2005 and continues on anti-rejection medications.   Dopplers positive for DVT. FMTS has discussed situation with heme and pt started on IV heparin for treatment of R LE DVT. PLTC low 64 this am. Will watch for s/s of bleeding closely with low plt count, will continue to aim for lower end of therapeutic range for heparin.   Heparin level at goal (0.5) without bleeding complications, cbc stable. New orders 4/20 to start warfarin. INR this am is 1.2 trending up. Given low body weight, low pltc, and currently receiving  antirejection meds will continue coumadin at low dose tonight.  Goal of Therapy:  Heparin level 0.3-0.7 units/ml INR goal 2-3 Monitor platelets by anticoagulation protocol: Yes   Plan:  Continue IV heparin at 900 units/hr Daily heparin level, INR and CBC Warfarin 2.5mg  tonight  Erin Hearing PharmD., BCPS Clinical Pharmacist Pager [phone] 06/18/2013 7:22 AM

## 2013-06-18 NOTE — Progress Notes (Signed)
Clinical Social Work Department BRIEF PSYCHOSOCIAL ASSESSMENT 06/18/2013  Patient:  [name],[name]     Account Number:  [id number]     Admit date:  06/12/2013  Clinical Social Worker:  Freeman Caldron  Date/Time:  06/17/2013 04:25 PM  Referred by:  Physician  Date Referred:  06/17/2013 Referred for  SNF Placement   Other Referral:   Interview type:  Patient Other interview type:   Pt's significant other and mother in room.    PSYCHOSOCIAL DATA Living Status:  FAMILY Admitted from facility:   Level of care:   Primary support name:  Kathleen Lime [phone]) Primary support relationship to patient:  FAMILY Degree of support available:   Good--pt has support from significant other and mother.    CURRENT CONCERNS Current Concerns  Post-Acute Placement   Other Concerns:    SOCIAL WORK ASSESSMENT / PLAN CSW went to pt's room to explain SNF back-up to CIR. Pt hopeful CIR can take pt. Gave CSW permission to make referrals to back-up SNFs, particularly [facility]. [facility] unable to offer a bed because they do not contract with insurance. Pt has bed offers from 3 facilities, and facility liaisons went to speak with pt about their SNFs. CSW following for SNF as back-up to CIR.   Assessment/plan status:  Psychosocial Support/Ongoing Assessment of Needs Other assessment/ plan:   Information/referral to community resources:   SNF? CIR?    PATIENT'S/FAMILY'S RESPONSE TO PLAN OF CARE: Good--pt participated in conversation with CSW. CSW answered pt's questions.       Ky Barban, MSW, [facility] Clinical Social Worker [phone]

## 2013-06-18 NOTE — Progress Notes (Signed)
Patient ID: [name], female   DOB: Jul 08, 1960, 53 y.o.   MRN: [id number] S:feels better, no c/o O:BP 93/56  Pulse 98  Temp(Src) 98.5 F (36.9 C) (Oral)  Resp 19  Ht '5\' 3"'  (1.6 m)  Wt 45.36 kg (100 lb)  BMI 17.72 kg/m2  SpO2 100%  Intake/Output Summary (Last 24 hours) at 06/18/13 0859 Last data filed at 06/18/13 0800  Gross per 24 hour  Intake 885.63 ml  Output      0 ml  Net 885.63 ml   Intake/Output: I/O last 3 completed shifts: In: 1056.6 [P.O.:600; I.V.:456.6] Out: -   Intake/Output this shift:  Total I/O In: 9 [I.V.:9] Out: -  Weight change:  BSW:HQPRF in NAD CVS:no rub Resp:cta3 FMB:WGYKZL Ext:no edema   Recent Labs Lab 06/12/13 1500 06/12/13 1529 06/13/13 0500 06/14/13 0800 06/15/13 0334 06/16/13 0305 06/17/13 0250 06/18/13 0232  NA 143 143 143 144 142 137 139 142  K 3.6* 4.0 3.5* 4.3 4.5 4.9 4.5 4.5  CL 109 112 109 115* 113* 109 110 112  CO2 14* 14* 15* 15* 16* 15* 16* 18*  GLUCOSE 95 92 88 89 84 84 88 85  BUN 66* 66* 65* 54* 51* 50* 52* 52*  CREATININE 4.41* 4.29* 4.24* 3.96* 3.67* 3.47* 3.53* 3.51*  ALBUMIN 3.5  --  2.9*  --   --   --   --   --   CALCIUM 7.3* 7.4* 7.0* 8.0* 7.9* 7.9* 7.4* 7.6*  PHOS 1.5*  --   --   --   --   --   --   --   AST  --   --  12  --   --   --   --   --   ALT  --   --  7  --   --   --   --   --    Liver Function Tests:  Recent Labs Lab 06/12/13 1500 06/13/13 0500  AST  --  12  ALT  --  7  ALKPHOS  --  383*  BILITOT  --  0.3  PROT  --  5.0*  ALBUMIN 3.5 2.9*   No results found for this basename: LIPASE, AMYLASE,  in the last 168 hours No results found for this basename: AMMONIA,  in the last 168 hours CBC:  Recent Labs Lab 06/15/13 0334 06/15/13 2345 06/16/13 0305 06/17/13 0250 06/18/13 0232  WBC 3.8* 4.5 4.7 4.7 4.2  HGB 6.5* 8.2* 8.2* 8.0* 7.4*  HCT 20.8* 25.3* 25.7* 24.7* 23.9*  MCV 82.9 82.7 83.7 83.2 84.5  PLT 58* 59* 62* 68* 64*   Cardiac Enzymes: No results found for this basename:  CKTOTAL, CKMB, CKMBINDEX, TROPONINI,  in the last 168 hours CBG: No results found for this basename: GLUCAP,  in the last 168 hours  Iron Studies: No results found for this basename: IRON, TIBC, TRANSFERRIN, FERRITIN,  in the last 72 hours Studies/Results: No results found. . calcitRIOL  1 mcg Oral Daily  . calcium carbonate  2 tablet Oral TID  . cinacalcet  30 mg Oral Q breakfast  . darbepoetin (ARANESP) injection - NON-DIALYSIS  200 mcg Subcutaneous Q Sat-1800  . feeding supplement (RESOURCE BREEZE)  1 Container Oral TID BM  . lactose free nutrition  237 mL Oral TID WC  . magnesium oxide  400 mg Oral TID  . multivitamin with minerals  1 tablet Oral Daily  . mycophenolate  250 mg Oral BID  . potassium  chloride SA  40 mEq Oral Daily  . sodium bicarbonate  1,300 mg Oral TID  . sodium chloride  3 mL Intravenous Q12H  . tacrolimus  1 mg Oral BID  . warfarin   Does not apply Once  . Warfarin - Pharmacist Dosing Inpatient   Does not apply q1800    BMET    Component Value Date/Time   NA 142 06/18/2013 0232   K 4.5 06/18/2013 0232   CL 112 06/18/2013 0232   CO2 18* 06/18/2013 0232   GLUCOSE 85 06/18/2013 0232   BUN 52* 06/18/2013 0232   CREATININE 3.51* 06/18/2013 0232   CALCIUM 7.6* 06/18/2013 0232   CALCIUM 8.8 04/29/2013 1327   GFRNONAA 14* 06/18/2013 0232   GFRAA 16* 06/18/2013 0232   CBC    Component Value Date/Time   WBC 4.2 06/18/2013 0232   RBC 2.83* 06/18/2013 0232   RBC 2.85* 06/12/2013 1618   HGB 7.4* 06/18/2013 0232   HCT 23.9* 06/18/2013 0232   PLT 64* 06/18/2013 0232   MCV 84.5 06/18/2013 0232   MCH 26.1 06/18/2013 0232   MCHC 31.0 06/18/2013 0232   RDW 19.2* 06/18/2013 0232   LYMPHSABS 1.2 09/02/2010 1648   MONOABS 0.2 09/02/2010 1648   EOSABS 0.0 09/02/2010 1648   BASOSABS 0.0 09/02/2010 1648     Assessment/Plan:  1. Pancytopenia- in setting of advanced CKD (on ESA) due to allograft nephropathy and immunosuppressive therapy. Platelets improving  1. Decreased cellcept and  prograf dose at time of admission with improvement. 2. CMV DNA PCR as well as EBV ordered on 4/16, however it is till pending. Awaiting results. 3. Also need to r/o malignancy given longstanding immunosuppressive therapy and weight loss. (mammo, colonoscopy pending) 4. Hematology consult noted, however not seen yet this week. Await their input and decision regarding proceeding with bone marrow biopsy to r/o malignancy/PTLD  5. Cont to follow and transfuse as needed 2. 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 She is not sure she would resume RRT if needed.  2. May need to involve palliative care if her condition worsens as she has mentioned "getting affairs in order" however her overall renal function is stable 3. No significant change in overall renal function and actually improved over the last 2 years (although has lost more body weight) 3. Immunosuppressive therapy- will decrease cellcept to 250 bid and prograf to 26m bid and check prograf level were 7.2.  Cont with lower dose of prograf for now. 4. 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 No on aranesp and sensipar. 5. Protein malnutrition- increase supplementation and appreciate Nutritionist eval 6. SHPTH/renal osteodystrophy- likely source of increased Alk phos and LDH, cont with vit D. CXR c/w renal osteodystrophy changes 7. Metabolic acidosis- start bicarb po and follow 8. Hypokalemia- replete and increase po intake 9. Dispo- for d/c to CIR.  Nothing further to add. Will sign off and have pt f/u with Dr. PPosey Prontoin our office 2 weeks after discharge.  Please call with questions or concerns  JDonetta Potts

## 2013-06-18 NOTE — Progress Notes (Signed)
Pt orientation to unit, room and routine. SR up x 2, fall risk assessment complete with patient verbalizing understanding of risks associated with falls. Pt verbalizes an understanding of how to use the call bell and to call for help before getting out of bed.  Skin, clean-dry- intact without evidence of bruising, or skin tears.     Will cont to monitor and assist as needed.  Velora Mediate, RN

## 2013-06-18 NOTE — Progress Notes (Signed)
[facility]aged [facility] 7.4 this am. Will continue to monitor and await further orders.

## 2013-06-18 NOTE — Progress Notes (Signed)
Inpatient Rehabilitation  I met with Ms. Sirmons at the bedside to discuss her post acute rehab options.  She prefers to come to CIR for her rehab.  I left her a brochure and  will initiate the process for insurance authorization through [employer].  Please call if questions.  Susan Blankenship PT Inpatient Rehab Admissions Coordinator Cell [phone] Office [phone]  

## 2013-06-18 NOTE — Progress Notes (Signed)
Seen and examined.  Agree with Dr. Milagros Reap documentation and management.

## 2013-06-18 NOTE — Progress Notes (Signed)
[facility] for heparin Indication: DVT  Allergies  Allergen Reactions  . Cephalosporins Diarrhea and Nausea Only  . Erythromycin Diarrhea and Nausea Only  . Fexofenadine-Pseudoephed Er Other (See Comments)    dizzy    Patient Measurements: Height: 5\' 3"  (160 cm) Weight: 100 lb (45.36 kg) IBW/kg (Calculated) : 52.4 Heparin Dosing Weight: 45kg  Vital Signs: Temp: 99 F (37.2 C) (04/21 0329) Temp src: Oral (04/21 0329) BP: 95/56 mmHg (04/21 0329) Pulse Rate: 88 (04/21 0329)  Labs:  Recent Labs  06/16/13 0305 06/17/13 0250 06/17/13 1443 06/18/13 0232  HGB 8.2* 8.0*  --  7.4*  HCT 25.7* 24.7*  --  23.9*  PLT 62* 68*  --  PENDING  LABPROT  --   --  14.6 15.7*  INR  --   --  1.16 1.28  HEPARINUNFRC 0.30 0.41  --  0.24*  CREATININE 3.47* 3.53*  --   --     Estimated Creatinine Clearance: 13.1 ml/min (by C-G formula based on Cr of 3.53).  Assessment: 53 yo female with DVT for heparin  Goal of Therapy:  Heparin level 0.3-0.7 units/ml Monitor platelets by anticoagulation protocol: Yes   Plan:  Increase Heparin 900 units/hr Check heparin level in 8 hours.   Phillis Knack, PharmD, BCPS   06/18/2013 4:21 AM

## 2013-06-18 NOTE — Evaluation (Signed)
Occupational Therapy Evaluation Patient Details Name: [name] MRN: [id number] DOB: 07-Dec-1960 Today's Date: 06/18/2013    History of Present Illness [name] is a 53 y.o. female presenting with anemia . PMH is significant for CKD (due to congential issue) s/p renal transplant at [facility], Stroke, Seizures and anemia.   Clinical Impression   PT admitted with anemia with renal transplant at [facility]. Pt currently with functional limitiations due to the deficits listed below (see OT problem list).  Pt will benefit from skilled OT to increase their independence and safety with adls and balance to allow discharge CIR. Pt could benefit from CIR due to decr activity tolerance, gait abnormalities, decr balance and ability to progress toward MOD I goals in a 2 week time frame.     Follow Up Recommendations  CIR    Equipment Recommendations  3 in 1 bedside comode    Recommendations for Other Services Rehab consult (to reach MOD I)     Precautions / Restrictions Precautions Precautions: Fall Precaution Comments: Suppressed immune system precautions Restrictions Weight Bearing Restrictions: No      Mobility Bed Mobility                  Transfers Overall transfer level: Needs assistance   Transfers: Sit to/from Stand Sit to Stand: Min assist              Balance Overall balance assessment: Needs assistance         Standing balance support: Bilateral upper extremity supported;During functional activity Standing balance-Leahy Scale: Fair                              ADL Overall ADL's : Needs assistance/impaired Eating/Feeding: Modified independent   Grooming: Wash/dry hands;Oral care;Brushing hair;Set up;Sitting   Upper Body Bathing: Min guard;Sitting   Lower Body Bathing: Min guard;Sit to/from stand           Toilet Transfer: Minimal assistance;BSC;RW (unsteady gait)           Functional mobility during ADLs: Minimal assistance;Rolling  walker General ADL Comments: Pt demonstrates need for incr time to complete sit<.stand and use of bil Ue. pt static standing with bil UE support. Pt requires pillow for elevation to make sit<>stand easier. pt completed transfer ~8 ft and requires rest break     Vision                     Perception     Praxis      Pertinent Vitals/Pain No pain reported at this time     Hand Dominance Right   Extremity/Trunk Assessment Upper Extremity Assessment Upper Extremity Assessment: Generalized weakness   Lower Extremity Assessment Lower Extremity Assessment: Defer to PT evaluation   Cervical / Trunk Assessment Cervical / Trunk Assessment: Normal   Communication Communication Communication: No difficulties   Cognition Arousal/Alertness: Awake/alert Behavior During Therapy: WFL for tasks assessed/performed Overall Cognitive Status: Within Functional Limits for tasks assessed                     General Comments       Exercises       Shoulder Instructions      Home Living Family/patient expects to be discharged to:: Private residence Living Arrangements: Spouse/significant other Available Help at Discharge: Family;Available PRN/intermittently Type of Home: House Home Access: Stairs to enter Entrance Stairs-Number of Steps: 3   Home Layout: One level  Bathroom Shower/Tub: Gaffer;Door   [employer] Toilet: Standard     Home Equipment: Environmental consultant - 2 wheels          Prior Functioning/Environment Level of Independence: Independent with assistive device(s)        Comments: was completely independent until last winter; after renal transplant, pt has required the use of rW    OT Diagnosis: Generalized weakness   OT Problem List: Decreased strength;Decreased activity tolerance;Impaired balance (sitting and/or standing);Decreased safety awareness;Decreased knowledge of use of DME or AE;Decreased knowledge of precautions   OT  Treatment/Interventions: Self-care/ADL training;Therapeutic exercise;DME and/or AE instruction;Therapeutic activities;Patient/family education;Balance training    OT Goals(Current goals can be found in the care plan section) Acute Rehab OT Goals Patient Stated Goal: Very motivated to work and get back to independence OT Goal Formulation: With patient Time For Goal Achievement: 07/02/13 Potential to Achieve Goals: Good  OT Frequency: Min 2X/week   Barriers to D/C:            Co-evaluation              End of Session Equipment Utilized During Treatment: Gait belt;Rolling walker Nurse Communication: Mobility status;Precautions  Activity Tolerance: Patient tolerated treatment well Patient left: in chair;with call bell/phone within reach   Time: [id number] OT Time Calculation (min): 17 min Charges:  OT General Charges $OT Visit: 1 Procedure OT Evaluation $Initial OT Evaluation Tier I: 1 Procedure OT Treatments $Self Care/Home Management : 8-22 mins G-Codes:    Peri Maris 2013/07/15, 3:52 PM Pager: [phone]

## 2013-06-19 DIAGNOSIS — R0789 Other chest pain: Secondary | ICD-10-CM

## 2013-06-19 LAB — CBC
HCT: 22.8 % — ABNORMAL LOW (ref 36.0–46.0)
Hemoglobin: 7.4 g/dL — ABNORMAL LOW (ref 12.0–15.0)
MCH: 27.1 pg (ref 26.0–34.0)
MCHC: 32.5 g/dL (ref 30.0–36.0)
MCV: 83.5 fL (ref 78.0–100.0)
PLATELETS: 91 10*3/uL — AB (ref 150–400)
RBC: 2.73 MIL/uL — ABNORMAL LOW (ref 3.87–5.11)
RDW: 19.2 % — AB (ref 11.5–15.5)
WBC: 4.9 10*3/uL (ref 4.0–10.5)

## 2013-06-19 LAB — BASIC METABOLIC PANEL
BUN: 53 mg/dL — ABNORMAL HIGH (ref 6–23)
CALCIUM: 7.7 mg/dL — AB (ref 8.4–10.5)
CO2: 16 meq/L — AB (ref 19–32)
CREATININE: 3.52 mg/dL — AB (ref 0.50–1.10)
Chloride: 110 mEq/L (ref 96–112)
GFR calc non Af Amer: 14 mL/min — ABNORMAL LOW (ref >90)
GFR, EST AFRICAN AMERICAN: 16 mL/min — AB (ref >90)
Glucose, Bld: 78 mg/dL (ref 70–99)
Potassium: 4.8 mEq/L (ref 3.7–5.3)
SODIUM: 140 meq/L (ref 137–147)

## 2013-06-19 LAB — MAGNESIUM: MAGNESIUM: 1.7 mg/dL (ref 1.5–2.5)

## 2013-06-19 LAB — PROTIME-INR
INR: 1.69 — ABNORMAL HIGH (ref 0.00–1.49)
Prothrombin Time: 19.4 seconds — ABNORMAL HIGH (ref 11.6–15.2)

## 2013-06-19 LAB — CMV (CYTOMEGALOVIRUS) DNA ULTRAQUANT, PCR

## 2013-06-19 LAB — QUANTIFERON TB GOLD ASSAY (BLOOD)
Mitogen value: 0.33 IU/mL
Quantiferon Nil Value: 0.01 IU/mL
TB AG VALUE: 0.01 [IU]/mL
TB Antigen Minus Nil Value: 0 IU/mL

## 2013-06-19 LAB — HEPARIN LEVEL (UNFRACTIONATED): HEPARIN UNFRACTIONATED: 0.5 [IU]/mL (ref 0.30–0.70)

## 2013-06-19 MED ORDER — DARBEPOETIN ALFA-POLYSORBATE 200 MCG/0.4ML IJ SOLN: 200.0000 ug | INTRAMUSCULAR | Status: DC

## 2013-06-19 MED ORDER — TACROLIMUS 1 MG PO CAPS: 1.0000 mg | ORAL_CAPSULE | Freq: Two times a day (BID) | ORAL | Status: DC

## 2013-06-19 MED ORDER — CALCIUM CARBONATE ANTACID 500 MG PO CHEW: 2.0000 | CHEWABLE_TABLET | Freq: Three times a day (TID) | ORAL | Status: DC

## 2013-06-19 MED ORDER — ACETAMINOPHEN 160 MG/5ML PO SOLN: 650.0000 mg | Freq: Three times a day (TID) | ORAL | Status: AC | PRN

## 2013-06-19 MED ORDER — CINACALCET HCL 30 MG PO TABS: 30.0000 mg | ORAL_TABLET | Freq: Every day | ORAL | Status: DC

## 2013-06-19 MED ORDER — SODIUM CHLORIDE 0.9 % IV BOLUS (SEPSIS): 500.0000 mL | Freq: Once | INTRAVENOUS | Status: DC

## 2013-06-19 MED ORDER — HYDROCODONE-ACETAMINOPHEN 5-325 MG PO TABS: 1.0000 | ORAL_TABLET | ORAL | Status: DC | PRN

## 2013-06-19 MED ORDER — CALCITRIOL 0.5 MCG PO CAPS: 1.0000 ug | ORAL_CAPSULE | Freq: Every day | ORAL | Status: DC

## 2013-06-19 MED ORDER — SODIUM BICARBONATE 650 MG PO TABS: 1300.0000 mg | ORAL_TABLET | Freq: Three times a day (TID) | ORAL | Status: DC

## 2013-06-19 MED ORDER — MYCOPHENOLATE MOFETIL 250 MG PO CAPS: 250.0000 mg | ORAL_CAPSULE | Freq: Two times a day (BID) | ORAL | Status: DC

## 2013-06-19 MED ORDER — POTASSIUM CHLORIDE CRYS ER 20 MEQ PO TBCR: 40.0000 meq | EXTENDED_RELEASE_TABLET | Freq: Every day | ORAL | Status: DC

## 2013-06-19 MED ORDER — WARFARIN SODIUM 2.5 MG PO TABS
2.5000 mg | ORAL_TABLET | Freq: Once | ORAL | Status: AC
  Administered 2013-06-19: 2.5 mg via ORAL
  Filled 2013-06-19: qty 1

## 2013-06-19 MED ORDER — COUMADIN BOOK
Freq: Once | Status: AC
  Administered 2013-06-19: 11:00:00
  Filled 2013-06-19: qty 1

## 2013-06-19 NOTE — Progress Notes (Signed)
Inpatient Rehabilitation  I met with [name] at the bedside and informed her that I have received insurance authorization for CIR admission.  She is very pleased.  Pt. Does not appear medically ready for CIR today (based on labs).  Will follow up tomorrow to determine her readiness.  Pt. Indicates that the current plan is to hold off on bone marrow biopsy.  Please call if questions today.  My co-worker Danne Baxter will assume her case tomorrow in my absence, and can be reached at [phone].  [place] Admissions Coordinator Cell [phone] Office [phone]

## 2013-06-19 NOTE — Progress Notes (Signed)
Patient [name]      DOB: 1960/08/01      [id number]  Met with [name] last evening she is improving. She has an advanced directive packet to fill out and I gave her a most form. She stated she had been talking about surrogate decision makers with her mom, because she is concerned that her son may not be best person for that role.  She will decide in the future.  She knows she can call our phone if she wants to talk further. Will shadow with you till she enters CIR. Don't hesitate to call if new issue arises.  For now she needs time to process.  [name] Ricotta L. Lovena Le, MD MBA The Palliative Medicine Team at [facility] Phone: [phone] Pager: [phone]

## 2013-06-19 NOTE — Progress Notes (Signed)
[name]   DOB:29-May-1960   CR#:[id number]   CHE#:[id number]  Subjective: No acute overnight events noted. Sitting at the bed in no acute distress.   Objective:  Filed Vitals:   06/19/13 1040  BP: 82/54  Pulse: 104  Temp:   Resp: 17    Body mass index is 17.29 kg/(m^2).  Intake/Output Summary (Last 24 hours) at 06/19/13 1534 Last data filed at 06/18/13 2100  Gross per 24 hour  Intake     90 ml  Output      0 ml  Net     90 ml    General: 53 y.o. female in no acute distress,well-developed and cachectic  HEENT: normocephalic,atraumatic temporal wasting. .Oral cavity without thrush or lesions.Sclerae anicteric.  Neck: supple,no thyromegaly.No cervical or supraclavicular adenopathy  Lungs:clear bilaterally.No wheezing,rhonchi or rales.No axillary masses.  Breasts: not examined.  Cardiac:regular rate and rhythm,no murmur,rubs or gallops  Abdomen:soft,somewhat distended, which appears to have a fluid wave,date diffusely mildly tender to palpation,bowel sounds x4.No hepatosplenomegaly. + hernia and s/p kidney transplant.   Extremities:no clubbing,cyanosis or edema. No bruising or petechial rash. No inguinal lymph nodes palpable.  Musculoskeletal: no spinal tenderness.chest bilateral lower rib pain  Neuro:no motor or sensory deficts    Labs:  Lab Results  Component Value Date   WBC 4.9 06/19/2013   HGB 7.4* 06/19/2013   HCT 22.8* 06/19/2013   MCV 83.5 06/19/2013   PLT 91* 06/19/2013   NEUTROABS 4.3 09/02/2010   Urine Studies No results found for this basename: UACOL, UAPR, USPG, UPH, UTP, UGL, UKET, UBIL, UHGB, UNIT, UROB, ULEU, UEPI, UWBC, URBC, UBAC, CAST, CRYS, UCOM, BILUA,  in the last 72 hours  Basic Metabolic Panel:  Recent Labs Lab 06/15/13 0334 06/16/13 0305 06/17/13 0250 06/18/13 0232 06/19/13 0610  NA 142 137 139 142 140  K 4.5 4.9 4.5 4.5 4.8  CL 113* 109 110 112 110  CO2 16* 15* 16* 18* 16*  GLUCOSE 84 84 88 85 78  BUN 51* 50* 52* 52* 53*  CREATININE 3.67*  3.47* 3.53* 3.51* 3.52*  CALCIUM 7.9* 7.9* 7.4* 7.6* 7.7*  MG 1.8 1.7 1.7 1.6 1.7   GFR Estimated Creatinine Clearance: 12.8 ml/min (by C-G formula based on Cr of 3.52). Liver Function Tests:  Recent Labs Lab 06/13/13 0500  AST 12  ALT 7  ALKPHOS 383*  BILITOT 0.3  PROT 5.0*  ALBUMIN 2.9*   Coagulation profile  Recent Labs Lab 06/13/13 0803 06/17/13 1443 06/18/13 0232 06/19/13 0610  INR 1.38 1.16 1.28 1.69*    CBC:  Recent Labs Lab 06/15/13 2345 06/16/13 0305 06/17/13 0250 06/18/13 0232 06/19/13 0610  WBC 4.5 4.7 4.7 4.2 4.9  HGB 8.2* 8.2* 8.0* 7.4* 7.4*  HCT 25.3* 25.7* 24.7* 23.9* 22.8*  MCV 82.7 83.7 83.2 84.5 83.5  PLT 59* 62* 68* 64* 91*   Microbiology Recent Results (from the past 240 hour(s))  MRSA PCR SCREENING     Status: None   Collection Time    06/14/13  3:00 AM      Result Value Ref Range Status   MRSA by PCR NEGATIVE  NEGATIVE Final   Comment:            The GeneXpert MRSA Assay (FDA     approved for NASAL specimens     only), is one component of a     comprehensive MRSA colonization     surveillance program. It is not     intended to diagnose MRSA  infection nor to guide or     monitor treatment for     MRSA infections.   IMAGES: METASTATIC BONE SURVEY  Comparison: None.  Findings: No lytic or sclerotic lesion is identified in the axial  or appendicular skeleton. The patient has some cervical  degenerative disease with loss of disc space height and endplate  spurring worst at C6-7. Degenerative disc disease is also noted at  L1-2 where there is loss of disc space height and endplate  spurring.  IMPRESSION:  1. Negative for lytic or sclerotic lesion.  2. 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    Studies:  No results found.  Assessment: 53 y.o.  1. Pancytopenia: in the setting of chronic disease, including ESRD status post cadaveric kidney transplant in 2005. Patient is stage IV, and is being followed by the renal  service. Her ANC is 4,300.  Her anemia is very significant, acute on chronic. No recent ESA therapy has been given.  On admission, the patient was on CellCept and Prograft, which she continues to receive was hospitalized. She is very symptomatic for anemia, for which she may have to receive more transfusions. Iron studies are consistent with chronic disease.Likely anemia of chronic kidney disease.  Check EPO level and replete per nephrology.   Her platelets are low, but they have been low for a long time, in the setting of CellCept and Prograft .  2. ESRD, stage IV, as per renal service.   3. 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 --UPEP negative for monoclonal free light chains.  Polyclonal increase in free kappa and/or free lambda light chains likely secondary to [facility]. SPEP negative for M-spike. Skieletal survey done 09/03/2010 is negative. This is negative for MGUS or multiple myeloma.   5 right rib pain, in the setting of renal osteodystrophy. Patient has significant debilitation and pain.although the chest x-ray was negative. Evidence of probable renal osteodystrophy of the spine per chest x-ray on 4/`06/2013.  6 Full Code  Supportive transfusion may be recommended if no underlying risk, if platelets less than 10,000 or acute bleed, Neupogen until ANC>1 and RBC transfusion if Hb <7 or acutely bleeding.   Concha Norway, MD 06/19/2013  3:34 PM

## 2013-06-19 NOTE — Progress Notes (Signed)
FPTS PGY-2 Interim Note  Paged by RN; pt with asymptomatic hypotension this AM. Pt has had BP's consistently in the [id number] systolic, but this AM had BP of 76/45 (automatic) and 80/42 (manual). Will bolus NS 500 mL and continue to monitor. If new / different symptoms develop, will need to re-evaluate and consider transfer back to stepdown. Note pt is anemic and thrombocytopenic, bridging to Coumadin with heparin drip. Will f/u on morning labs and consider transfusion if Hb drops acutely and may need to look for bleeds. Otherwise, continue per previous notes.  [name] Kluver, MD PGY-2, [facility] 06/19/2013, 6:23 AM FPTS Service pager: [phone] (text pages welcome through [facility])

## 2013-06-19 NOTE — Progress Notes (Signed)
ANTICOAGULATION CONSULT NOTE - F/U Consult  Pharmacy Consult for heparin>>warfarin  Indication: DVT  Allergies  Allergen Reactions  . Cephalosporins Diarrhea and Nausea Only  . Erythromycin Diarrhea and Nausea Only  . Fexofenadine-Pseudoephed Er Other (See Comments)    dizzy    Patient Measurements: Height: 5\' 3"  (160 cm) Weight: 97 lb 9.6 oz (44.271 kg) IBW/kg (Calculated) : 52.4 Heparin Dosing Weight: 45kg  Vital Signs: Temp: 99.1 F (37.3 C) (04/22 0530) Temp src: Oral (04/22 0530) BP: 77/43 mmHg (04/22 0717) Pulse Rate: 85 (04/22 0717)  Labs:  Recent Labs  06/17/13 0250 06/17/13 1443 06/18/13 0232 06/18/13 1150 06/19/13 0610  HGB 8.0*  --  7.4*  --  7.4*  HCT 24.7*  --  23.9*  --  22.8*  PLT 68*  --  64*  --  91*  LABPROT  --  14.6 15.7*  --  19.4*  INR  --  1.16 1.28  --  1.69*  HEPARINUNFRC 0.41  --  0.24* 0.51 0.50  CREATININE 3.53*  --  3.51*  --  3.52*    Estimated Creatinine Clearance: 12.8 ml/min (by C-G formula based on Cr of 3.52).   Medical History: Past Medical History  Diagnosis Date  . Chronic kidney disease   . Anemia   . Stroke   . Hypertension   . Hypercalcemia   . Hypomagnesemia   . Complications of transplanted kidney   . Chronic kidney disease, stage IV (severe)   . Loss of weight   . Pancytopenia   . Adverse effect of immunosuppressive drug     Assessment: 53 year old female admitted with anemia/pancytopenia currently on heparin transitioning to warfarin new DVT. Pt also noted to be s/p kidney transplant in 2005 and continues on anti-rejection medications.   Dopplers positive for DVT. FMTS has discussed situation with heme and pt started on IV heparin for treatment of R LE DVT. Plts are improving but remain low at 91 today. Will watch for s/s of bleeding closely and will aim for lower end of therapeutic range for heparin.   Heparin level at goal (0.5) without bleeding complications, cbc stable. INR 1.69, fairly large jump  considering pt has only received two doses of warfarin.    Goal of Therapy:  Heparin level 0.3-0.7 units/ml INR goal 2-3 Monitor platelets by anticoagulation protocol: Yes   Plan:  Continue IV heparin at 900 units/hr Daily heparin level, INR and CBC Warfarin 2.5 mg po x1   Hughes Better, PharmD, BCPS Clinical Pharmacist Pager: [phone] 06/19/2013 8:15 AM

## 2013-06-19 NOTE — Progress Notes (Signed)
Physical Therapy Treatment Patient Details Name: [name] MRN: [id number] DOB: Aug 27, 1960 Today's Date: 06/19/2013    History of Present Illness [name] is a 53 y.o. female presenting with anemia . PMH is significant for CKD (due to congential issue) s/p renal transplant at [facility], Stroke, Seizures and anemia.    PT Comments    Making progress with mobility and activity tolerance; Continuing with decr stance stability bilaterally (though L less stable and seemingly more painful than R); WE discussed using RW for increasing activity tol by pushing down and unweighing painful hips when necessary, and shifting to more weight through LEs (and less on RW) when hip pain is less, thereby being able to work on hip stability and strengthening  Continue to recommend comprehensive inpatient rehab (CIR) for post-acute therapy needs.    Follow Up Recommendations  CIR     Equipment Recommendations  3in1 (PT)    Recommendations for Other Services       Precautions / Restrictions Precautions Precautions: Fall Precaution Comments: Suppressed immune system precautions    Mobility  Bed Mobility                  Transfers Overall transfer level: Needs assistance Equipment used: Rolling walker (2 wheeled) Transfers: Sit to/from Stand Sit to Stand: Min guard         General transfer comment: Cues for hand placement and technique; Definite need for UE push for successful sit <> stand and steadiness, but not needing physical assist  Ambulation/Gait Ambulation/Gait assistance: Min guard Ambulation Distance (Feet): 40 Feet Assistive device: Rolling walker (2 wheeled) Gait Pattern/deviations: Step-through pattern;Decreased stance time - left;Decreased step length - right Gait velocity: slowed Gait velocity interpretation: Below normal speed for age/gender General Gait Details: Antalgic; Cued pt to push down through RW to unweigh painful hips WBing in stance; RW sized for optimal  fit; Noted more pain/less stability in L stance than R   Stairs            Wheelchair Mobility    Modified Rankin (Stroke Patients Only)       Balance           Standing balance support: Bilateral upper extremity supported Standing balance-Leahy Scale: Fair Standing balance comment: Continued dependence on bil UE support for cbalance in stance                    Cognition Arousal/Alertness: Awake/alert Behavior During Therapy: WFL for tasks assessed/performed Overall Cognitive Status: Within Functional Limits for tasks assessed                      Exercises General Exercises - Lower Extremity Hip Flexion/Ext: AROM;Right;Left;15 reps;Standing (Pendulum-like flexion then swing into extension to "loosen up" swinging LE and work on stability in stance LE)    General Comments        Pertinent Vitals/Pain Reports 2/10 pain bilat hip swith amb patient repositioned for comfort     Home Living                      Prior Function            PT Goals (current goals can now be found in the care plan section) Acute Rehab PT Goals Patient Stated Goal: Very motivated to work and get back to independence PT Goal Formulation: With patient Time For Goal Achievement: 06/30/13 Potential to Achieve Goals: Good Progress towards PT goals: Progressing toward goals  Frequency  Min 3X/week    PT Plan Current plan remains appropriate    Co-evaluation             End of Session Equipment Utilized During Treatment: Gait belt Activity Tolerance: Patient tolerated treatment well Patient left: Other (comment) (Sitting EOB aobut to initiate OT)     Time: [id number] PT Time Calculation (min): 29 min  Charges:  $Gait Training: 8-22 mins $Therapeutic Exercise: 8-22 mins                    G Codes:      [facility] [place] 06/19/2013, 10:41 AM Roney Marion, PT  Acute Rehabilitation Services Pager [phone] Office [phone]

## 2013-06-19 NOTE — Progress Notes (Signed)
Occupational Therapy Treatment Patient Details Name: [name] MRN: [id number] DOB: September 18, 1960 Today's Date: 06/19/2013    History of present illness [name] is a 53 y.o. female presenting with anemia . PMH is significant for CKD (due to congential issue) s/p renal transplant at [facility], Stroke, Seizures and anemia.   OT comments  Pt making goof progress with functional goals and should continue with acute OT services to increase level of function and safety. Pt provided with energy conservation education and handout  Follow Up Recommendations  CIR    Equipment Recommendations  3 in 1 bedside comode;Other (comment) (ADL A/E kit)    Recommendations for Other Services Rehab consult    Precautions / Restrictions Precautions Precautions: Fall Precaution Comments: Suppressed immune system precautions Restrictions Weight Bearing Restrictions: No       Mobility Bed Mobility               General bed mobility comments: sitting EOB  Transfers Overall transfer level: Needs assistance Equipment used: Rolling walker (2 wheeled) Transfers: Sit to/from Stand Sit to Stand: Min guard         General transfer comment: Cues for hand placement and technique; Definite need for UE push for successful sit <> stand and steadiness, but not needing physical assist    Balance Overall balance assessment: Needs assistance Sitting-balance support: No upper extremity supported;Feet supported Sitting balance-Leahy Scale: Good     Standing balance support: Bilateral upper extremity supported;Single extremity supported;During functional activity Standing balance-Leahy Scale: Fair Standing balance comment: Continued dependence on bil UE support for cbalance in stance                   ADL Overall ADL's : Needs assistance/impaired         Upper Body Bathing: Supervision/ safety;Set up;Sitting   Lower Body Bathing: Min guard;Sit to/from stand;Set up;Supervison/ safety    Upper Body Dressing : Set up;Supervision/safety;Sitting   Lower Body Dressing: Set up;Min guard;Sit to/from stand   Toilet Transfer: Electronics engineer and Hygiene: Supervision/safety       Functional mobility during ADLs: Rolling walker;Min guard General ADL Comments: Pt provided with energy conservation techniques for ADLs and IADLs with handout provided, educated on DME and ADL A/E      Vision  wears reading glasses                              Cognition   Behavior During Therapy: WFL for tasks assessed/performed Overall Cognitive Status: Within Functional Limits for tasks assessed                                                 General Comments  Pt pleasant, cooperative and motivated    Pertinent Vitals/ Pain       No c/o pain, VSS                                                         Frequency Min 2X/week     Progress Toward Goals  OT Goals(current goals can now be found in the care plan section)  Progress towards OT goals: Progressing toward goals  Acute Rehab OT Goals Patient Stated Goal: Very motivated to work and get back to independence  Plan Discharge plan remains appropriate                     End of Session Equipment Utilized During Treatment: Rolling walker   Activity Tolerance Patient tolerated treatment well   Patient Left in bed;with nursing/sitter in room;with call bell/phone within reach;Other (comment) (sitting EOB)   Nurse Communication          Time: 4818-5909 OT Time Calculation (min): 27 min  Charges: OT General Charges $OT Visit: 1 Procedure OT Treatments $Self Care/Home Management : 8-22 mins $Therapeutic Activity: 8-22 mins  Mosetta Putt 06/19/2013, 1:39 PM

## 2013-06-19 NOTE — Progress Notes (Signed)
Seen and examined.  Discussed with Dr. Berkley Harvey.  Agree with his management and documentation.

## 2013-06-19 NOTE — Progress Notes (Signed)
Family Medicine Teaching Service Daily Progress Note Intern Pager: [phone]  Patient name: [name] Medical record number: [id number] Date of birth: [birth year] Age: 53 y.o. Gender: female  Primary Care Provider: Ulla Potash., MD Consultants: nephrology,  Code Status: full  Pt Overview and Major Events to Date:  4/15: Thrombocytopenia; Renal transplant (on anti-rejection medications) 4/16: Transfuse 1u; consult Nephrologist 4/17: C/s heme/onc for pancytopenia & DVT and palliative care for [place] 4/18: Hgb dec to 6.5; will transfuse another 1u RBC 4/20: Hgb  8.0; WBC wnl; plts stable 4/21: Transferring to floor - awaiting CIR approval and hematology recs  Assessment and Plan: [name] is a 53 y.o. female presenting with anemia . PMH is significant for CKD (due to congential issue) s/p renal transplant at [facility], Stroke, Seizures and anemia.   # Pancytopenia & Weight loss: Improving. WBC wnl, Plts stable ~ 90; Hgb stable ~ 7.4 - Anemia: Normocytic, likely some component of anemia of chronic disease, but this has been somewhat extreme; s/p pRBC x2 this admission.  Current stable at 8.2. - WBC trending up & Afebrile. - Likely Chronic (likely secondary to CKD and anti-rejection medications). However concerning for additional subacute process as well, possible CA or viral etiology. Reports baseline (~9-10) until September; Reports ~40 lbs wt loss since 9/14; FOBT (-); Never had colonoscopy; past due for mammogram. - LDH 254, Fibrinogen wnl INR 1.38, PTT wnl/ Haptoglobin wnl; HIV neg; Smear: Spherocytes, elliptocytes -  Anemia: Normocytic  Retic Production Index = 0.08; very inadequate response; Iron and ferritin elevated w/ low UIBC   Will be as judicious as possible with transfusing as she is currently being evaluated for second kidney transplant   Consulted Nephrologist; Discussed pt with Transplant doctor at [place]  Renal decreased Cellcept and Prograft  EBV  Neg  Quantiferon Gold & CMV pending  Renal signed off - Will continue to follow as outpatient - Hematology consulted  SPEP: No Mspike,  IFE: Polycolonal inc in kappa and lambda light chains  Neupogen until ANC>1 and RBC transfusion if Hb <8 or acutely bleeding.   Supportive transfusion may be recommended if no underlying risk, if platelets less than 10,000   Recommending Colonoscopy, PAP, Mammogram as outpatient  Will see her in clinic for INR checks and out pt managemetn - Palliative care c/s - Nutrition c/s: Company secretary  # DVT - Ddimer 5.09; LE dopplers: DVT noted in the saphenofemoral junction that extends into the common femoral vein - Transitioning to coumadin from Heparin per Heme/Onc recs - will monitor platelet count and for bleeding closely  #Hypotensioin - Pt asymptomatic. BP cuff was too large - attempting to get small cuff or manual readings - s/p 500 cc bolus; will hold off on additional boluses at this time unless pt become symptomatic  # Rib Pain: improving - SHPTH/renal osteodystrophy- likely source of increased Alk phos and LDH, cont with vit D.  - CXR c/w renal osteodystrophy changes - Heating pad - Continue Vicodin  #CKD s/p renal transplant  - Renal fcn appears at baseline  - has some acidosis but this appears to be chronic, on NaBicarb  - Continue anti-rejection medications as above - Renal managing   #Hypomagnesium; Resolved  - continue oral mag - monitor and replace as needed; 1.8 (4/18)  # Stroke & Seizures  - Reports stroke and subsequent seizures w/o residual deficits after episode of hypotension  - No symptoms, seizures or medications since   # Weakness - likely  due to CKD and above problems - PT recs CIR; OT to eval/treat for weakness - CIR- Awaiting Insurance approval   FEN/GI: SLIV; Reg diet; Ensure Prophylaxis: On therapeutic heparin as above    Disposition: Discharge pending CIR insurance approval   Subjective:   Doing well; No complaints. Denies Lightheadedness/dizzines. Denies CP, SOB or rt leg pain.   Objective: Temp:  [98 F (36.7 C)-99.1 F (37.3 C)] 99.1 F (37.3 C) (04/22 0530) Pulse Rate:  [85-99] 85 (04/22 0717) Resp:  [16-18] 16 (04/22 0530) BP: (76-102)/(42-70) 77/43 mmHg (04/22 0717) SpO2:  [100 %] 100 % (04/22 0530) Weight:  [97 lb 9.6 oz (44.271 kg)] 97 lb 9.6 oz (44.271 kg) (04/21 2109) Physical Exam: General: Thin frail Female; NAD. Sitting on edge of bed. HEENT: MMM Cardiovascular: RRR, no m/r/g Respiratory: CTAB, normal resp effort   Extremities: No edema  Laboratory:  Recent Labs Lab 06/17/13 0250 06/18/13 0232 06/19/13 0610  WBC 4.7 4.2 4.9  HGB 8.0* 7.4* 7.4*  HCT 24.7* 23.9* 22.8*  PLT 68* 64* 91*    Recent Labs Lab 06/13/13 0500  06/17/13 0250 06/18/13 0232 06/19/13 0610  NA 143  < > 139 142 140  K 3.5*  < > 4.5 4.5 4.8  CL 109  < > 110 112 110  CO2 15*  < > 16* 18* 16*  BUN 65*  < > 52* 52* 53*  CREATININE 4.24*  < > 3.53* 3.51* 3.52*  CALCIUM 7.0*  < > 7.4* 7.6* 7.7*  PROT 5.0*  --   --   --   --   BILITOT 0.3  --   --   --   --   ALKPHOS 383*  --   --   --   --   ALT 7  --   --   --   --   AST 12  --   --   --   --   GLUCOSE 88  < > 88 85 78  < > = values in this interval not displayed. Iron/TIBC/Ferritin    Component Value Date/Time   IRON 180* 06/12/2013 1618   TIBC NOT CALC 06/12/2013 1618   FERRITIN 489* 06/12/2013 1618   B12: 734 Folate: 8.4 Retic 0.4%; Count 11.4  Trop: Neg FOBT: Neg  Imaging/Diagnostic Tests: CXR FINDINGS: 06/12/13  The heart is mildly enlarged. There is no evidence of pulmonary  edema, consolidation, pneumothorax, nodule or pleural fluid. Lateral  projection shows a watch diet appearance of the thoracic vertebral  bodies suggestive of renal osteodystrophy. No fractures are  identified.  IMPRESSION:  No acute findings. 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   Phill Myron,  MD 06/19/2013, 8:52 AM PGY-1, [place] Intern pager: [phone], text pages welcome

## 2013-06-19 NOTE — Progress Notes (Signed)
Pt. B/P at 07:17 was 77/43, after 500 mL bolus.  Asymptomatic.  MD paged @ 07:31.  Dr. Berkley Harvey called back @ 07:32.  MD said that he would come discuss this with the patient.  No orders received.  Jillyn Ledger, MBA, BS, RN

## 2013-06-19 NOTE — Progress Notes (Signed)
Pt transferred to [facility]. Unit CSW provided a handoff via voicemail and handoff in CSW's box. This CSW signing off.   Ky Barban, MSW, [facility] Clinical Social Worker [phone]

## 2013-06-19 NOTE — Discharge Instructions (Addendum)

## 2013-06-20 ENCOUNTER — Encounter (HOSPITAL_COMMUNITY): Payer: Self-pay | Admitting: *Deleted

## 2013-06-20 ENCOUNTER — Inpatient Hospital Stay (HOSPITAL_COMMUNITY)
Admission: RE | Admit: 2013-06-20 | Discharge: 2013-06-28 | DRG: 945 | Disposition: A | Discharge status: Home Health | Payer: Medicare Other | Source: Intra-hospital | Attending: Physical Medicine & Rehabilitation | Admitting: Physical Medicine & Rehabilitation

## 2013-06-20 DIAGNOSIS — D61818 Other pancytopenia: Secondary | ICD-10-CM | POA: Diagnosis present

## 2013-06-20 DIAGNOSIS — I824Y9 Acute embolism and thrombosis of unspecified deep veins of unspecified proximal lower extremity: Secondary | ICD-10-CM | POA: Diagnosis present

## 2013-06-20 DIAGNOSIS — M67919 Unspecified disorder of synovium and tendon, unspecified shoulder: Secondary | ICD-10-CM | POA: Diagnosis not present

## 2013-06-20 DIAGNOSIS — I120 Hypertensive chronic kidney disease with stage 5 chronic kidney disease or end stage renal disease: Secondary | ICD-10-CM | POA: Diagnosis present

## 2013-06-20 DIAGNOSIS — M719 Bursopathy, unspecified: Secondary | ICD-10-CM | POA: Diagnosis not present

## 2013-06-20 DIAGNOSIS — R5381 Other malaise: Secondary | ICD-10-CM | POA: Diagnosis present

## 2013-06-20 DIAGNOSIS — Y830 Surgical operation with transplant of whole organ as the cause of abnormal reaction of the patient, or of later complication, without mention of misadventure at the time of the procedure: Secondary | ICD-10-CM | POA: Diagnosis present

## 2013-06-20 DIAGNOSIS — Z8673 Personal history of transient ischemic attack (TIA), and cerebral infarction without residual deficits: Secondary | ICD-10-CM

## 2013-06-20 DIAGNOSIS — N184 Chronic kidney disease, stage 4 (severe): Secondary | ICD-10-CM

## 2013-06-20 DIAGNOSIS — N186 End stage renal disease: Secondary | ICD-10-CM | POA: Diagnosis present

## 2013-06-20 DIAGNOSIS — T8610 Unspecified complication of kidney transplant: Secondary | ICD-10-CM | POA: Diagnosis present

## 2013-06-20 DIAGNOSIS — Z7901 Long term (current) use of anticoagulants: Secondary | ICD-10-CM

## 2013-06-20 DIAGNOSIS — E875 Hyperkalemia: Secondary | ICD-10-CM | POA: Diagnosis not present

## 2013-06-20 DIAGNOSIS — Z5189 Encounter for other specified aftercare: Principal | ICD-10-CM

## 2013-06-20 DIAGNOSIS — D649 Anemia, unspecified: Secondary | ICD-10-CM

## 2013-06-20 LAB — CBC
HEMATOCRIT: 22.1 % — AB (ref 36.0–46.0)
Hemoglobin: 7 g/dL — ABNORMAL LOW (ref 12.0–15.0)
MCH: 26.5 pg (ref 26.0–34.0)
MCHC: 31.7 g/dL (ref 30.0–36.0)
MCV: 83.7 fL (ref 78.0–100.0)
PLATELETS: 105 10*3/uL — AB (ref 150–400)
RBC: 2.64 MIL/uL — ABNORMAL LOW (ref 3.87–5.11)
RDW: 19.5 % — AB (ref 11.5–15.5)
WBC: 4.1 10*3/uL (ref 4.0–10.5)

## 2013-06-20 LAB — CYTOMEGALOVIRUS PCR, QUALITATIVE: CYTOMEGALOVIRUS DNA: NOT DETECTED

## 2013-06-20 LAB — HEPARIN LEVEL (UNFRACTIONATED)
Heparin Unfractionated: 0.19 IU/mL — ABNORMAL LOW (ref 0.30–0.70)
Heparin Unfractionated: 0.36 IU/mL (ref 0.30–0.70)

## 2013-06-20 LAB — BASIC METABOLIC PANEL
BUN: 52 mg/dL — AB (ref 6–23)
CALCIUM: 7.6 mg/dL — AB (ref 8.4–10.5)
CHLORIDE: 111 meq/L (ref 96–112)
CO2: 18 mEq/L — ABNORMAL LOW (ref 19–32)
Creatinine, Ser: 3.44 mg/dL — ABNORMAL HIGH (ref 0.50–1.10)
GFR calc non Af Amer: 14 mL/min — ABNORMAL LOW (ref >90)
GFR, EST AFRICAN AMERICAN: 16 mL/min — AB (ref >90)
Glucose, Bld: 81 mg/dL (ref 70–99)
Potassium: 5.3 mEq/L (ref 3.7–5.3)
Sodium: 140 mEq/L (ref 137–147)

## 2013-06-20 LAB — PROTIME-INR
INR: 2.2 — ABNORMAL HIGH (ref 0.00–1.49)
Prothrombin Time: 23.7 seconds — ABNORMAL HIGH (ref 11.6–15.2)

## 2013-06-20 LAB — MAGNESIUM: MAGNESIUM: 1.6 mg/dL (ref 1.5–2.5)

## 2013-06-20 MED ORDER — ONDANSETRON HCL 4 MG/2ML IJ SOLN: 4.0000 mg | Freq: Four times a day (QID) | INTRAMUSCULAR | Status: DC | PRN

## 2013-06-20 MED ORDER — WARFARIN SODIUM 2 MG PO TABS: 2.0000 mg | ORAL_TABLET | Freq: Once | ORAL | Status: DC

## 2013-06-20 MED ORDER — MAGNESIUM OXIDE 400 (241.3 MG) MG PO TABS
400.0000 mg | ORAL_TABLET | Freq: Three times a day (TID) | ORAL | Status: DC
  Administered 2013-06-20 – 2013-06-28 (×23): 400 mg via ORAL
  Filled 2013-06-20 (×30): qty 1

## 2013-06-20 MED ORDER — POTASSIUM CHLORIDE CRYS ER 20 MEQ PO TBCR
40.0000 meq | EXTENDED_RELEASE_TABLET | Freq: Every day | ORAL | Status: DC
  Administered 2013-06-21: 40 meq via ORAL
  Filled 2013-06-20 (×4): qty 2

## 2013-06-20 MED ORDER — ADULT MULTIVITAMIN W/MINERALS CH
1.0000 | ORAL_TABLET | Freq: Every day | ORAL | Status: DC
  Administered 2013-06-21 – 2013-06-28 (×8): 1 via ORAL
  Filled 2013-06-20 (×10): qty 1

## 2013-06-20 MED ORDER — WARFARIN - PHARMACIST DOSING INPATIENT: Freq: Every day | Status: DC

## 2013-06-20 MED ORDER — TACROLIMUS 1 MG PO CAPS
1.0000 mg | ORAL_CAPSULE | Freq: Two times a day (BID) | ORAL | Status: DC
  Administered 2013-06-20 – 2013-06-28 (×16): 1 mg via ORAL
  Filled 2013-06-20 (×19): qty 1

## 2013-06-20 MED ORDER — HEPARIN (PORCINE) IN NACL 100-0.45 UNIT/ML-% IJ SOLN: 1000.0000 [IU]/h | INTRAMUSCULAR | Status: DC

## 2013-06-20 MED ORDER — HYDROCODONE-ACETAMINOPHEN 5-325 MG PO TABS
1.0000 | ORAL_TABLET | ORAL | Status: DC | PRN
  Administered 2013-06-21 (×2): 1 via ORAL
  Administered 2013-06-22: 2 via ORAL
  Administered 2013-06-23 – 2013-06-26 (×5): 1 via ORAL
  Filled 2013-06-20: qty 2
  Filled 2013-06-20: qty 1
  Filled 2013-06-20: qty 2
  Filled 2013-06-20 (×2): qty 1
  Filled 2013-06-20: qty 2
  Filled 2013-06-20: qty 1
  Filled 2013-06-20 (×5): qty 2

## 2013-06-20 MED ORDER — WARFARIN SODIUM 2 MG PO TABS
2.0000 mg | ORAL_TABLET | Freq: Once | ORAL | Status: AC
  Administered 2013-06-20: 2 mg via ORAL
  Filled 2013-06-20: qty 1

## 2013-06-20 MED ORDER — ONDANSETRON HCL 4 MG PO TABS: 4.0000 mg | ORAL_TABLET | Freq: Four times a day (QID) | ORAL | Status: DC | PRN

## 2013-06-20 MED ORDER — SORBITOL 70 % SOLN: 30.0000 mL | Freq: Every day | Status: DC | PRN

## 2013-06-20 MED ORDER — WARFARIN SODIUM 2 MG PO TABS
2.0000 mg | ORAL_TABLET | Freq: Once | ORAL | Status: DC
  Filled 2013-06-20: qty 1

## 2013-06-20 MED ORDER — BOOST PLUS PO LIQD
237.0000 mL | Freq: Three times a day (TID) | ORAL | Status: DC
  Administered 2013-06-21 – 2013-06-28 (×22): 237 mL via ORAL
  Filled 2013-06-20 (×29): qty 237

## 2013-06-20 MED ORDER — MYCOPHENOLATE MOFETIL 250 MG PO CAPS
250.0000 mg | ORAL_CAPSULE | Freq: Two times a day (BID) | ORAL | Status: DC
  Administered 2013-06-20 – 2013-06-28 (×16): 250 mg via ORAL
  Filled 2013-06-20 (×21): qty 1

## 2013-06-20 MED ORDER — DARBEPOETIN ALFA-POLYSORBATE 200 MCG/0.4ML IJ SOLN
200.0000 ug | INTRAMUSCULAR | Status: DC
  Administered 2013-06-22: 200 ug via SUBCUTANEOUS
  Filled 2013-06-20 (×2): qty 0.4

## 2013-06-20 MED ORDER — CINACALCET HCL 30 MG PO TABS
30.0000 mg | ORAL_TABLET | Freq: Every day | ORAL | Status: DC
  Administered 2013-06-21 – 2013-06-28 (×8): 30 mg via ORAL
  Filled 2013-06-20 (×10): qty 1

## 2013-06-20 MED ORDER — HEPARIN (PORCINE) IN NACL 100-0.45 UNIT/ML-% IJ SOLN
1000.0000 [IU]/h | INTRAMUSCULAR | Status: DC
  Filled 2013-06-20 (×2): qty 250

## 2013-06-20 MED ORDER — ACETAMINOPHEN 160 MG/5ML PO SOLN
650.0000 mg | Freq: Three times a day (TID) | ORAL | Status: DC | PRN
  Administered 2013-06-22: 650 mg via ORAL
  Filled 2013-06-20: qty 20.3

## 2013-06-20 MED ORDER — CALCIUM CARBONATE ANTACID 500 MG PO CHEW
2.0000 | CHEWABLE_TABLET | Freq: Three times a day (TID) | ORAL | Status: DC
Start: 2013-06-20 — End: 2013-06-28
  Administered 2013-06-20 – 2013-06-22 (×6): 400 mg via ORAL
  Administered 2013-06-22: 200 mg via ORAL
  Administered 2013-06-23 – 2013-06-28 (×16): 400 mg via ORAL
  Filled 2013-06-20 (×26): qty 2

## 2013-06-20 MED ORDER — SODIUM BICARBONATE 650 MG PO TABS
1300.0000 mg | ORAL_TABLET | Freq: Three times a day (TID) | ORAL | Status: DC
  Administered 2013-06-20 – 2013-06-28 (×23): 1300 mg via ORAL
  Filled 2013-06-20 (×27): qty 2

## 2013-06-20 MED ORDER — CALCITRIOL 0.5 MCG PO CAPS
1.0000 ug | ORAL_CAPSULE | Freq: Every day | ORAL | Status: DC
  Administered 2013-06-21 – 2013-06-28 (×8): 1 ug via ORAL
  Filled 2013-06-20 (×11): qty 2

## 2013-06-20 NOTE — Progress Notes (Signed)
Family Medicine Teaching Service Daily Progress Note Intern Pager: [phone]  Patient name: [name] Medical record number: [id number] Date of birth: August 08, 1960 Age: 53 y.o. Gender: female  Primary Care Provider: Ulla Potash., MD Consultants: nephrology,  Code Status: full  Pt Overview and Major Events to Date:  4/15: Thrombocytopenia; Renal transplant (on anti-rejection medications) 4/16: Transfuse 1u; consult Nephrologist 4/17: C/s heme/onc for pancytopenia & DVT and palliative care for [place] 4/18: Hgb dec to 6.5; will transfuse another 1u RBC 4/20: Hgb  8.0; WBC wnl; plts stable 4/21: Transferring to floor - awaiting CIR approval and hematology recs  Assessment and Plan: [name] is a 53 y.o. female presenting with anemia . PMH is significant for CKD (due to congential issue) s/p renal transplant at [facility], Stroke, Seizures and anemia.   # Pancytopenia & Weight loss: Improving. WBC wnl, Plts stable ~ 90; Hgb stable ~ 7.4 - Anemia: Normocytic, likely some component of anemia of chronic disease, but this has been somewhat extreme; s/p pRBC x2 this admission.  Current stable at 8.2. - WBC trending up & Afebrile. - Likely Chronic (likely secondary to CKD and anti-rejection medications). However concerning for additional subacute process as well, possible CA or viral etiology. Reports baseline (~9-10) until September; Reports ~40 lbs wt loss since 9/14; FOBT (-); Never had colonoscopy; past due for mammogram. - LDH 254, Fibrinogen wnl INR 1.38, PTT wnl/ Haptoglobin wnl; HIV neg; Smear: Spherocytes, elliptocytes -  Anemia: Normocytic  Retic Production Index = 0.08; very inadequate response; Iron and ferritin elevated w/ low UIBC   Will be as judicious as possible with transfusing as she is currently being evaluated for second kidney transplant   Consulted Nephrologist; Discussed pt with Transplant doctor at [place]  Renal decreased Cellcept and Prograft  EBV  Neg  Quantiferon Gold   CMV negative  Renal signed off - Will continue to follow as outpatient - Hematology consulted  SPEP: No Mspike,  IFE: Polycolonal inc in kappa and lambda light chains  Neupogen until ANC>1 and RBC transfusion if Hb <8 or acutely bleeding.   Supportive transfusion may be recommended if no underlying risk, if platelets less than 10,000   Recommending Colonoscopy, PAP, Mammogram as outpatient  Will see her in clinic for INR checks and out pt managemetn - Palliative care c/s - Nutrition c/s: Company secretary  # DVT: last INR: 2.2 - Ddimer 5.09; LE dopplers: DVT noted in the saphenofemoral junction that extends into the common femoral vein - Transitioning to coumadin from Heparin per Heme/Onc recs - will monitor platelet count and for bleeding closely  #Hypotensioin - Pt asymptomatic. BP cuff was too large - attempting to get small cuff or manual readings - s/p 500 cc bolus; will hold off on additional boluses at this time unless pt become symptomatic  # Rib Pain: improving - SHPTH/renal osteodystrophy- likely source of increased Alk phos and LDH, cont with vit D.  - CXR c/w renal osteodystrophy changes - Heating pad - Continue Vicodin  #CKD s/p renal transplant  - Renal fcn appears at baseline  - has some acidosis but this appears to be chronic, on NaBicarb  - Continue anti-rejection medications as above - Renal managing   #Hypomagnesium; Resolved  - continue oral mag - monitor and replace as needed; 1.8 (4/18)  # Stroke & Seizures  - Reports stroke and subsequent seizures w/o residual deficits after episode of hypotension  - No symptoms, seizures or medications since   #  Weakness - likely due to CKD and above problems - PT recs CIR; OT to eval/treat for weakness - CIR- Awaiting medical clearance   FEN/GI: SLIV; Reg diet; Ensure Prophylaxis: On therapeutic heparin as above    Disposition: Discharge pending therapeutic achievement  of warfarin   Subjective:  Patient doing well today. Eager to start rehab. Has some stiffness with moving which is her baseline. No lightheadedness. No complaints overnight.   Objective: Temp:  [98 F (36.7 C)-98.7 F (37.1 C)] 98.7 F (37.1 C) (04/23 0507) Pulse Rate:  [59-104] 74 (04/23 0507) Resp:  [16-18] 16 (04/23 0507) BP: (77-101)/(42-65) 84/42 mmHg (04/23 0507) SpO2:  [96 %-100 %] 100 % (04/23 0507)  Physical Exam: General: Thin frail Female; in no acute distress, laying down in bed HEENT: Moist mucous membranes Cardiovascular: RRR, no m/r/g Respiratory: CTAB, normal resp effort   Extremities: No edema  Laboratory:  Recent Labs Lab 06/17/13 0250 06/18/13 0232 06/19/13 0610  WBC 4.7 4.2 4.9  HGB 8.0* 7.4* 7.4*  HCT 24.7* 23.9* 22.8*  PLT 68* 64* 91*    Recent Labs Lab 06/17/13 0250 06/18/13 0232 06/19/13 0610  NA 139 142 140  K 4.5 4.5 4.8  CL 110 112 110  CO2 16* 18* 16*  BUN 52* 52* 53*  CREATININE 3.53* 3.51* 3.52*  CALCIUM 7.4* 7.6* 7.7*  GLUCOSE 88 85 78   Iron/TIBC/Ferritin    Component Value Date/Time   IRON 180* 06/12/2013 1618   TIBC NOT CALC 06/12/2013 1618   FERRITIN 489* 06/12/2013 1618   B12: 734 Folate: 8.4 Retic 0.4%; Count 11.4  Trop: Neg FOBT: Neg  Imaging/Diagnostic Tests:  No recent imaging  Cordelia Poche, MD 06/20/2013, 6:22 AM PGY-1, [place] Intern pager: [phone], text pages welcome

## 2013-06-20 NOTE — Progress Notes (Signed)
Interim Progress Note  Paged by Pamala Hurry from [facility] and said they would take patient today. Michela Pitcher they will manage heparin-coumadin while patient is admitted to their service. Will set patient up for discharge.  Cordelia Poche, MD PGY-1, [facility] Family Medicine 06/20/2013, 1:47 PM Page: [phone]

## 2013-06-20 NOTE — Progress Notes (Signed)
ANTICOAGULATION CONSULT NOTE - F/U Consult  Pharmacy Consult for heparin>>warfarin  Indication: DVT  Allergies  Allergen Reactions  . Cephalosporins Diarrhea and Nausea Only  . Erythromycin Diarrhea and Nausea Only  . Fexofenadine-Pseudoephed Er Other (See Comments)    dizzy    Patient Measurements: Height: 5\' 3"  (160 cm) Weight: 97 lb 9.6 oz (44.271 kg) IBW/kg (Calculated) : 52.4 Heparin Dosing Weight: 45kg  Vital Signs: Temp: 99 F (37.2 C) (04/23 0839) Temp src: Oral (04/23 0839) BP: 83/61 mmHg (04/23 0839) Pulse Rate: 76 (04/23 0839)  Labs:  Recent Labs  06/18/13 0232 06/18/13 1150 06/19/13 0610 06/20/13 0620  HGB 7.4*  --  7.4* 7.0*  HCT 23.9*  --  22.8* 22.1*  PLT 64*  --  91* 105*  LABPROT 15.7*  --  19.4* 23.7*  INR 1.28  --  1.69* 2.20*  HEPARINUNFRC 0.24* 0.51 0.50 0.19*  CREATININE 3.51*  --  3.52* 3.44*    Estimated Creatinine Clearance: 13.1 ml/min (by C-G formula based on Cr of 3.44).  Assessment: 53 year old female admitted with anemia/pancytopenia, also s/p kidney transplant in 2005 and continues on anti-rejection medications.   Dopplers positive for DVT. FMTS has discussed situation with heme and pt started on IV heparin for treatment of R LE DVT. Currently on heparin transitioning to warfarin for new DVT. Today is day #4 out of minimum of 5 for treatment overlap.   INR is at goal today at 2.2. Needs to remain therapeutic for 24 hours and also have received 5 doses of warfarin to complete required overlap. HL dropped this morning to 0.19 units/mL. Per RN, there were some issues with IV line yesterday, suspect this is the cause of the drop.  Hgb 7, plts 105. Both low, but stable. No overt bleeding noted at this time.  Goal of Therapy:  Heparin level 0.3-0.7 units/ml INR goal 2-3 Monitor platelets by anticoagulation protocol: Yes   Plan:  1. Increase IV heparin to 1000 units/hr 2. Recheck heparin level in 8 hours 3. Warfarin 2mg  po x1  tonight 4. Daily heparin level, INR and CBC 5. Follow for s/s bleeding  Kendry Pfarr D. Breta Demedeiros, PharmD, BCPS Clinical Pharmacist Pager: [phone] 06/20/2013 9:49 AM

## 2013-06-20 NOTE — Progress Notes (Signed)
Insurance has approved an inpt rehab admission and pt is in agreement . Attending service is aware and in agreement. I will make arrangements to admit today. RN CM is aware. [id number]

## 2013-06-20 NOTE — Progress Notes (Signed)
Chaplain received referral to connect patient with Catholic priest. Bonney Roussel visited with patient, who said she would like to receive communion. Chaplain will add patient to the [place] list so that she will receive a communion visit Friday. Patient said she has been in [place] about three years, originally from [place]. She described helpful family support: a son and his family, her mother, and her boyfriend. She expressed feeling a sense of purpose in her writing and in travel. Chaplain provided emotional support through empathic listening, pastoral presence, and connecting her with [place] priest. Please page for follow up.   Ethelene Browns [phone]

## 2013-06-20 NOTE — Progress Notes (Signed)
ANTICOAGULATION CONSULT NOTE - F/U Consult  Pharmacy Consult for heparin>>warfarin  Indication: DVT  Allergies  Allergen Reactions  . Cephalosporins Diarrhea and Nausea Only  . Erythromycin Diarrhea and Nausea Only  . Fexofenadine-Pseudoephed Er Other (See Comments)    dizzy    Patient Measurements: Height: 5\' 3"  (160 cm) Weight: 101 lb 10.1 oz (46.1 kg) IBW/kg (Calculated) : 52.4 Heparin Dosing Weight: 45kg  Vital Signs: Temp: 97.8 F (36.6 C) (04/23 1721) Temp src: Oral (04/23 1721) BP: 94/53 mmHg (04/23 1721) Pulse Rate: 85 (04/23 1721)  Labs:  Recent Labs  06/18/13 0232  06/19/13 0610 06/20/13 0620 06/20/13 1905  HGB 7.4*  --  7.4* 7.0*  --   HCT 23.9*  --  22.8* 22.1*  --   PLT 64*  --  91* 105*  --   LABPROT 15.7*  --  19.4* 23.7*  --   INR 1.28  --  1.69* 2.20*  --   HEPARINUNFRC 0.24*  < > 0.50 0.19* 0.36  CREATININE 3.51*  --  3.52* 3.44*  --   < > = values in this interval not displayed.  Estimated Creatinine Clearance: 13.6 ml/min (by C-G formula based on Cr of 3.44).  Assessment: 53 year old female admitted with anemia/pancytopenia, also s/p kidney transplant in 2005 and continues on anti-rejection medications.   Dopplers positive for DVT. FMTS has discussed situation with heme and pt started on IV heparin for treatment of R LE DVT. Currently on heparin transitioning to warfarin for new DVT. Today is day #4 out of minimum of 5 for treatment overlap.   INR is at goal today at 2.2. Needs to remain therapeutic for 24 hours and also have received 5 doses of warfarin to complete required overlap. HL dropped this morning to 0.19 units/mL. Per RN, there were some issues with IV line yesterday, suspect this is the cause of the drop. Heparin drip was increased to 1000 uts/hr recheck HL 0.36 - at goal.  Pt transferred to CIR with heparin still running per RN.  Warfarin dose not given prior to transfer - will reorder  Hgb 7, plts 105. Both low, but stable. No  overt bleeding noted at this time.  Goal of Therapy:  Heparin level 0.3-0.7 units/ml INR goal 2-3 Monitor platelets by anticoagulation protocol: Yes   Plan:  1. Continue  IV heparin to 1000 units/hr 2. Warfarin 2mg  po x1 tonight 3. Daily heparin level, INR and CBC  Bonnita Nasuti Pharm.D. CPP, BCPS Clinical Pharmacist [phone] 06/20/2013 7:57 PM

## 2013-06-20 NOTE — PMR Pre-admission (Signed)
PMR Admission Coordinator Pre-Admission Assessment  Patient: [name] is an 53 y.o., female MRN: [id number] DOB: 1961/02/02 Height: '5\' 3"'  (160 cm) Weight: 44.271 kg (97 lb 9.6 oz)              Insurance Information HMO:     PPO: yes     PCP:      IPA:      80/20:      OTHER: medicare advantage plan PRIMARY: [employer]      Policy#: [id number]      Subscriber: pt CM Name: Sunday Shams      Phone#: [phone]     Fax#: [phone] Pre-Cert#: [id number]      Employer: disabled Benefits:  Phone #: [phone]     Name: 4/22 Eff. Date: 02/28/10     Deduct: none      Out of Pocket Max: $3850      Life Max: none CIR: $265 per day, days 1 - 6      SNF: no copay days 1-20; $140 per day days 21-100 Outpatient: $40 per visit     Co-Pay: no visit limit Home Health: 100%      Co-Pay: no visit limit DME: 80%     Co-Pay: 20% Providers: in network  SECONDARY: none        Emergency Contact Information Contact Information   Name Relation Home Work Mobile   Tal[place] Son [phone]     Brayton Mars Significant other [phone]       Current Medical History  Patient Admitting [facility] related to kidney disease and multiple medical issues above  History of Present Illness: [name] is a 53 y.o. 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 Patient has lived in [place] since 2012 and was independent up until September when generalized decline living with her boyfriend. Presented 06/12/2013 with generalized weakness, pancytopenia and recent weight loss. Noted decrease in hemoglobin from 10 on 05/12/2013 to 7.6-5.9. Chest x-ray was negative. Hematology service was consulted 06/14/2013 with workup ongoing question plan for bone marrow biopsy. Renal services follow up latest creatinine 3.47 question plan to resume RRT.   Palliative care met  with pt for goals of care 06/19/13. Advanced directive packet given to her. Discussed surrogate decision makers.  Hematology consulted 06/19/13 Pancytopenia: in the setting of chronic disease, including ESRD status post cadaveric kidney transplant in 2005. Patient is stage IV, and is being followed by the renal service. Her ANC is 4,300.  Her anemia is very significant, acute on chronic. No recent ESA therapy has been given. On admission, the patient was on CellCept and Prograft, which she continues to receive was hospitalized. She is very symptomatic for anemia, for which she may have to receive more transfusions. Iron studies are consistent with chronic disease.Likely anemia of chronic kidney disease. Check EPO level and replete per nephrology.  Her platelets are low, but they have been low for a long time, in the setting of CellCept and Prograft .  Supportive transfusion may be recommended if no underlying risk, if platelets less than 10,000 or acute bleed, Neupogen until ANC>1 and RBC transfusion if Hb <7 or acutely bleeding.    Past Medical History   Past Medical History  Diagnosis Date  . Chronic kidney disease   . Anemia   . Stroke   . Hypertension   . Hypercalcemia   . Hypomagnesemia   . Complications of  transplanted kidney   . Chronic kidney disease, stage IV (severe)   . Loss of weight   . Pancytopenia   . Adverse effect of immunosuppressive drug     Family History  family history is not on file.  Prior Rehab/Hospitalizations: home health only  Current Medications  Current facility-administered medications:0.9 %  sodium chloride infusion, 250 mL, Intravenous, PRN, Phill Myron, MD, 250 mL at 06/17/13 0800;  acetaminophen (TYLENOL) solution 650 mg, 650 mg, Oral, Q8H PRN, Phill Myron, MD, 650 mg at 06/20/13 0039;  calcitRIOL (ROCALTROL) capsule 1 mcg, 1 mcg, Oral, Daily, Rexene Agent, MD, 1 mcg at 06/20/13 1105 calcium carbonate (TUMS - dosed in mg elemental calcium) chewable  tablet 400 mg of elemental calcium, 2 tablet, Oral, TID, Rexene Agent, MD, 400 mg of elemental calcium at 06/20/13 1031;  cinacalcet (SENSIPAR) tablet 30 mg, 30 mg, Oral, Q breakfast, Rexene Agent, MD, 30 mg at 06/20/13 1104;  darbepoetin (ARANESP) injection 200 mcg, 200 mcg, Subcutaneous, Q Sat-1800, Rexene Agent, MD, 200 mcg at 06/15/13 1817 heparin ADULT infusion 100 units/mL (25000 units/250 mL), 1,000 Units/hr, Intravenous, Continuous, Lauren Bajbus, RPH, Last Rate: 10 mL/hr at 06/20/13 1006, 1,000 Units/hr at 06/20/13 1006;  HYDROcodone-acetaminophen (NORCO/VICODIN) 5-325 MG per tablet 1-2 tablet, 1-2 tablet, Oral, Q4H PRN, Phill Myron, MD, 1 tablet at 06/19/13 1853 lactose free nutrition (BOOST PLUS) liquid 237 mL, 237 mL, Oral, TID WC, Elonda Husky, RD, 237 mL at 06/18/13 0800;  magnesium oxide (MAG-OX) tablet 400 mg, 400 mg, Oral, TID, Phill Myron, MD, 400 mg at 06/20/13 1104;  multivitamin with minerals tablet 1 tablet, 1 tablet, Oral, Daily, Phill Myron, MD, 1 tablet at 06/20/13 1031;  mycophenolate (CELLCEPT) capsule 250 mg, 250 mg, Oral, BID, Donetta Potts, MD, 250 mg at 06/20/13 1104 potassium chloride SA (K-DUR,KLOR-CON) CR tablet 40 mEq, 40 mEq, Oral, Daily, Phill Myron, MD, 40 mEq at 06/20/13 1105;  sodium bicarbonate tablet 1,300 mg, 1,300 mg, Oral, TID, Phill Myron, MD, 1,300 mg at 06/20/13 1105;  sodium chloride 0.9 % bolus 500 mL, 500 mL, Intravenous, Once, Sharon [address], MD;  sodium chloride 0.9 % injection 3 mL, 3 mL, Intravenous, Q12H, Phill Myron, MD, 3 mL at 06/20/13 1107 sodium chloride 0.9 % injection 3 mL, 3 mL, Intravenous, PRN, Phill Myron, MD;  tacrolimus (PROGRAF) capsule 1 mg, 1 mg, Oral, BID, Donetta Potts, MD, 1 mg at 06/20/13 1105;  warfarin (COUMADIN) tablet 2 mg, 2 mg, Oral, ONCE-1800, Lauren Bajbus, RPH;  warfarin (COUMADIN) video, , Does not apply, Once, Georgina Peer, [facility];  Warfarin - Pharmacist Dosing Inpatient, , Does not apply,  q1800, Georgina Peer, RPH  Patients Current Diet: General weight loss 40 lbs over past several months. Has a lot of dietary needs.  Precautions / Restrictions Precautions Precautions: Fall Precaution Comments: Suppressed immune system precautions Restrictions Weight Bearing Restrictions: No   Prior Activity Level Community (5-7x/wk): prior to 9/14 pt did yoga, pilates, and weights.up until Nov was grocery shopping with assist, has not been driving recently. Used RW since [place] MD appts weekly since Dec /2014  [place] Devices/Equipment: Environmental consultant (specify type) (quad) Home Equipment: Gilford Rile - 2 wheels  Prior Functional Level Prior Function Level of Independence: Independent with assistive device(s) Comments: [facility] Transplant was in [place]. Being placed back on transplant list now  Current Functional Level Cognition  Overall Cognitive Status: Within Functional Limits for tasks assessed Orientation Level: Oriented X4  Extremity Assessment (includes Sensation/Coordination)          ADLs  Overall ADL's : Needs assistance/impaired Eating/Feeding: Modified independent Grooming: Wash/dry hands;Oral care;Brushing hair;Set up;Standing;Min guard Upper Body Bathing: Set up;Min guard;Standing Lower Body Bathing: Min guard;Sit to/from stand;Set up;Supervison/ safety Upper Body Dressing : Set up;Standing;Min guard Lower Body Dressing: Set up;Min guard;Sit to/from stand Toilet Transfer: Min guard;Regular Toilet;Grab bars Toileting- Water quality scientist and Hygiene: Supervision/safety;Sit to/from stand;Min guard Functional mobility during ADLs: Rolling walker;Min guard General ADL Comments: Pt provided with ADL A/E demonstration. Pt requires increased time to complete bathing and dressing tasks, slow pace sit - stand    Mobility  General bed mobility comments: sitting EOB    Transfers  Overall transfer level: Needs  assistance Equipment used: Rolling walker (2 wheeled) Transfers: Sit to/from Stand Sit to Stand: Min guard General transfer comment: Cues for hand placement and technique; Definite need for UE push for successful sit <> stand and steadiness, but not needing physical assist    Ambulation / Gait / Stairs / Wheelchair Mobility  Ambulation/Gait Ambulation/Gait assistance: Min guard Ambulation Distance (Feet): 40 Feet Assistive device: Rolling walker (2 wheeled) Gait Pattern/deviations: Step-through pattern;Decreased stance time - left;Decreased step length - right Gait velocity: slowed Gait velocity interpretation: Below normal speed for age/gender General Gait Details: Antalgic; Cued pt to push down through RW to unweigh painful hips WBing in stance; RW sized for optimal fit; Noted more pain/less stability in L stance than R    Posture / Balance      Special needs/care consideration Continuous Drip IV Heparin IV bridge to coumadin Bowel mgmt: continent Bladder mgmt: continent   Previous Home Environment Living Arrangements: Spouse/significant other  Lives With: Significant other Available Help at Discharge: Family;Available PRN/intermittently Type of Home: House Home Layout: One level Home Access: Stairs to enter [employer] of Steps: 1 stoop step Bathroom Shower/Tub: Walk-in shower;Door [employer] Toilet: Standard Bathroom Accessibility: Yes How Accessible: Accessible via walker Home Care Services: Yes Type of Home Care Services: Home PT;Home OT;[place] (if known): [facility]  Discharge Living Setting Plans for Discharge Living Setting: Patient's home;Lives with (comment);Other (Comment) (significant other) Type of Home at Discharge: House Discharge Home Layout: One level Discharge Home Access: Stairs to enter Entrance Stairs-Rails: None Entrance Stairs-Number of Steps: 1 stoop Discharge Bathroom Shower/Tub: Walk-in shower;Door Discharge Bathroom  Toilet: Standard Discharge Bathroom Accessibility: Yes How Accessible: Accessible via walker Does the patient have any problems obtaining your medications?: No  Social/Family/Support Systems Patient Roles: Partner;Other (Comment) (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) Contact Information: Evony Rezek son in [place] and partner, Napolean Anticipated Caregiver: Darrick Huntsman, boyfriend Anticipated Caregiver's Contact Information: see above Ability/Limitations of Caregiver: intermittent Caregiver Availability: Intermittent Discharge Plan Discussed with Primary Caregiver: Yes Is Caregiver In Agreement with Plan?: Yes Does Caregiver/Family have Issues with Lodging/Transportation while Pt is in Rehab?: No  Goals/Additional Needs Patient/Family Goal for Rehab: Mod I with PT and OT Expected length of stay: ELOS 7 to 10 days Dietary Needs: Renal diet Special Service Needs: Needs close follow up of her chronic anemia and transfusi Pt/Family Agrees to Admission and willing to participate: Yes Program Orientation Provided & Reviewed with Pt/Caregiver Including Roles  & Responsibilities: Yes   Decrease burden of Care through IP rehab admission: n/a  Possible need for SNF placement upon discharge:no   Patient Condition: This patient's medical and functional status has changed since the consult dated: 06/17/13 in which the Rehabilitation Physician determined  and documented that the patient's condition is appropriate for intensive rehabilitative care in an inpatient rehabilitation facility. See "History of Present Illness" (above) for medical update. Functional changes are: overall min assist. Patient's medical and functional status update has been discussed with the Rehabilitation physician and patient remains appropriate for inpatient rehabilitation. Will admit to inpatient rehab today.  Preadmission Screen Completed By:  Cleatrice Burke, 06/20/2013 2:27  PM ______________________________________________________________________   Discussed status with Dr. Letta Pate on 06/20/13 at  1426 and received telephone approval for admission today.  Admission Coordinator:  Cleatrice Burke, time 3361 Date 06/20/13.

## 2013-06-20 NOTE — H&P (Signed)
Physical Medicine and Rehabilitation Admission H&P  Chief Complaint   Patient presents with   .  Weakness   .  Diarrhea   :  HPI: [name] is a 53 y.o. 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 Patient has lived in [place] since 2012 and was independent up until September when generalized decline living with her boyfriend. Presented 06/12/2013 with generalized weakness, pancytopenia and recent weight loss. Noted decrease in hemoglobin from 10 on 05/12/2013 to 7.6-5.9. Chest x-ray was negative. Hematology service was consulted 06/14/2013 with workup ongoing question plan for bone marrow biopsy that can be completed as outpatient. Monitor neuro platelet counts currently 91,000 related to CellCept and Prograf. A venous Doppler study completed bilateral lower extremities 06/14/2013 shows acute DVT involving the right saphenofemoral junction extending into the common femoral vein. Superficial thrombus noted in the right greater saphenous vein with Heparin Coumadin initiated 06/15/2013 Renal services follow up latest creatinine 3.47 question plan to resume RRT. Patient has been transfused with latest hemoglobin 7.4 felt to be chronic in nature and monitored closely while on anticoagulations. Palliative care is involved to establish goals of care. Physical and occupational therapy evaluations completed 06/16/2013 with recommendations for physical medicine rehabilitation consult. Patient was admitted for comprehensive rehabilitation program  ROS Review of Systems  Constitutional: Positive for weight loss and malaise/fatigue.  Gastrointestinal: Positive for constipation.  Neurological: Positive for weakness.  All other systems reviewed and are negative  Past Medical History   Diagnosis  Date   .  Chronic kidney disease    .  Anemia    .  Stroke    .  Hypertension    .   Hypercalcemia    .  Hypomagnesemia    .  Complications of transplanted kidney    .  Chronic kidney disease, stage IV (severe)    .  Loss of weight    .  Pancytopenia    .  Adverse effect of immunosuppressive drug     Past Surgical History   Procedure  Laterality  Date   .  Kidney transplant      No family history on file.  Social History: reports that she has never smoked. She has never used smokeless tobacco. She reports that she does not drink alcohol or use illicit drugs.  Allergies:  Allergies   Allergen  Reactions   .  Cephalosporins  Diarrhea and Nausea Only   .  Erythromycin  Diarrhea and Nausea Only   .  Fexofenadine-Pseudoephed Er  Other (See Comments)     dizzy    Medications Prior to Admission   Medication  Sig  Dispense  Refill   .  acetaminophen (TYLENOL) 500 MG tablet  Take 500-1,000 mg by mouth every 6 (six) hours as needed for mild pain.     .  calcitRIOL (ROCALTROL) 0.5 MCG capsule  Take 0.5 mcg by mouth daily.     .  calcium carbonate (HEALTHY MAMA TAME THE FLAME) 500 MG chewable tablet  Chew 2 tablets by mouth 3 (three) times daily.     Marland Kitchen  lactose free nutrition (BOOST) LIQD  Take 237 mLs by mouth 3 (three) times daily between meals.     .  magnesium oxide (MAG-OX) 400 MG tablet  Take 400 mg by mouth 3 (three) times daily.     .  Multiple Vitamin (MULTI-VITAMINS) TABS  Take 1 tablet  by mouth daily.     .  mycophenolate (CELLCEPT) 500 MG tablet  Take 500 mg by mouth 2 (two) times daily.     Marland Kitchen  OVER THE COUNTER MEDICATION  Take 1 tablet by mouth daily. Mega-red     .  Potassium Chloride ER 20 MEQ TBCR  Take 2 tablets by mouth daily.     .  sodium bicarbonate 325 MG tablet  Take 1,300 mg by mouth 3 (three) times daily.     .  tacrolimus (PROGRAF) 1 MG capsule  Take 2 mg by mouth 2 (two) times daily.      Home:  Home Living  Family/patient expects to be discharged to:: Private residence  Living Arrangements: Spouse/significant other  Available Help at Discharge:  Family;Available PRN/intermittently  Type of Home: House  Home Access: Stairs to enter  [employer] of Steps: 3  Home Layout: One level  Home Equipment: Walker - 2 wheels  Functional History:  Prior Function  Level of Independence: Independent with assistive device(s)  Comments: was completely independent until last winter; after renal transplant, pt has required the use of rW  Functional Status:  Mobility:   Transfers  Overall transfer level: Needs assistance  Equipment used: Rolling walker (2 wheeled)  Transfers: Sit to/from Stand  Sit to Stand: Min guard  General transfer comment: Cues for hand placement and technique; Definite need for UE push for successful sit <> stand and steadiness, but not needing physical assist  Ambulation/Gait  Ambulation/Gait assistance: Min guard  Ambulation Distance (Feet): 40 Feet  Assistive device: Rolling walker (2 wheeled)  Gait Pattern/deviations: Step-through pattern;Decreased stance time - left;Decreased step length - right  Gait velocity: slowed  Gait velocity interpretation: Below normal speed for age/gender  General Gait Details: Antalgic; Cued pt to push down through RW to unweigh painful hips WBing in stance; RW sized for optimal fit; Noted more pain/less stability in L stance than R   ADL:   Cognition:  Cognition  Overall Cognitive Status: Within Functional Limits for tasks assessed  Orientation Level: Oriented X4  Cognition  Arousal/Alertness: Awake/alert  Behavior During Therapy: WFL for tasks assessed/performed  Overall Cognitive Status: Within Functional Limits for tasks assessed  Physical Exam:  Blood pressure 82/54, pulse 104, temperature 99.1 F (37.3 C), temperature source Oral, resp. rate 17, height _0  (1.6 m), weight 97 lb 9.6 oz (44.271 kg), SpO2 96.00%.  Physical Exam  Constitutional: She is oriented to person, place, and time.  58 year frail female in no acute distress sitting at the edge the bed.   HENT:  Head: Normocephalic.  Eyes: EOM are normal.  Neck: Normal range of motion. Neck supple. No tracheal deviation present. No thyromegaly present.  Cardiovascular: Normal rate and regular rhythm.  Respiratory: Effort normal and breath sounds normal. No respiratory distress.  GI: Soft. Bowel sounds are normal. She exhibits no distension.  Neurological: She is alert and oriented to person, place, and time. No cranial nerve deficit. She exhibits normal muscle tone. Coordination normal.  UE 4- deltoid, 4 bicep and tricep, 4+HI, HF 3+, KE 4-, ankles 4+. No sensory findings  Skin: Skin is warm and dry.  Psychiatric: She has a normal mood and affect  Results for orders placed during the hospital encounter of 06/12/13 (from the past 48 hour(s))   PROTIME-INR Status: None    Collection Time    06/17/13 2:43 PM   Result  Value  Ref Range    Prothrombin Time  14.6  11.6 - 15.2 seconds    INR  1.16  0.00 - 1.49   CBC Status: Abnormal    Collection Time    06/18/13 2:32 AM   Result  Value  Ref Range    WBC  4.2  4.0 - 10.5 K/uL    RBC  2.83 (*)  3.87 - 5.11 MIL/uL    Hemoglobin  7.4 (*)  12.0 - 15.0 g/dL    HCT  23.9 (*)  36.0 - 46.0 %    MCV  84.5  78.0 - 100.0 fL    MCH  26.1  26.0 - 34.0 pg    MCHC  31.0  30.0 - 36.0 g/dL    RDW  19.2 (*)  11.5 - 15.5 %    Platelets  64 (*)  150 - 400 K/uL    Comment:  PLATELET COUNT CONFIRMED BY SMEAR     REPEATED TO VERIFY     SPECIMEN CHECKED FOR CLOTS   BASIC METABOLIC PANEL Status: Abnormal    Collection Time    06/18/13 2:32 AM   Result  Value  Ref Range    Sodium  142  137 - 147 mEq/L    Potassium  4.5  3.7 - 5.3 mEq/L    Chloride  112  96 - 112 mEq/L    CO2  18 (*)  19 - 32 mEq/L    Glucose, Bld  85  70 - 99 mg/dL    BUN  52 (*)  6 - 23 mg/dL    Creatinine, Ser  3.51 (*)  0.50 - 1.10 mg/dL    Calcium  7.6 (*)  8.4 - 10.5 mg/dL    GFR calc non Af Amer  14 (*)  >90 mL/min    GFR calc Af Amer  16 (*)  >90 mL/min    Comment:  (NOTE)      The eGFR has been calculated using the CKD EPI equation.     This calculation has not been validated in all clinical situations.     eGFR's persistently <90 mL/min signify possible Chronic Kidney     Disease.   MAGNESIUM Status: None    Collection Time    06/18/13 2:32 AM   Result  Value  Ref Range    Magnesium  1.6  1.5 - 2.5 mg/dL   HEPARIN LEVEL (UNFRACTIONATED) Status: Abnormal    Collection Time    06/18/13 2:32 AM   Result  Value  Ref Range    Heparin Unfractionated  0.24 (*)  0.30 - 0.70 IU/mL    Comment:      IF HEPARIN RESULTS ARE BELOW     EXPECTED VALUES, AND PATIENT     DOSAGE HAS BEEN CONFIRMED,     SUGGEST FOLLOW UP TESTING     OF ANTITHROMBIN III LEVELS.   PROTIME-INR Status: Abnormal    Collection Time    06/18/13 2:32 AM   Result  Value  Ref Range    Prothrombin Time  15.7 (*)  11.6 - 15.2 seconds    INR  1.28  0.00 - 1.49   HEPARIN LEVEL (UNFRACTIONATED) Status: None    Collection Time    06/18/13 11:50 AM   Result  Value  Ref Range    Heparin Unfractionated  0.51  0.30 - 0.70 IU/mL    Comment:      IF HEPARIN RESULTS ARE BELOW     EXPECTED VALUES, AND PATIENT     DOSAGE HAS BEEN  CONFIRMED,     SUGGEST FOLLOW UP TESTING     OF ANTITHROMBIN III LEVELS.   CBC Status: Abnormal    Collection Time    06/19/13 6:10 AM   Result  Value  Ref Range    WBC  4.9  4.0 - 10.5 K/uL    RBC  2.73 (*)  3.87 - 5.11 MIL/uL    Hemoglobin  7.4 (*)  12.0 - 15.0 g/dL    HCT  22.8 (*)  36.0 - 46.0 %    MCV  83.5  78.0 - 100.0 fL    MCH  27.1  26.0 - 34.0 pg    MCHC  32.5  30.0 - 36.0 g/dL    RDW  19.2 (*)  11.5 - 15.5 %    Platelets  91 (*)  150 - 400 K/uL    Comment:  SPECIMEN CHECKED FOR CLOTS     REPEATED TO VERIFY     DELTA CHECK NOTED   BASIC METABOLIC PANEL Status: Abnormal    Collection Time    06/19/13 6:10 AM   Result  Value  Ref Range    Sodium  140  137 - 147 mEq/L    Potassium  4.8  3.7 - 5.3 mEq/L    Chloride  110  96 - 112 mEq/L    CO2  16 (*)  19 -  32 mEq/L    Glucose, Bld  78  70 - 99 mg/dL    BUN  53 (*)  6 - 23 mg/dL    Creatinine, Ser  3.52 (*)  0.50 - 1.10 mg/dL    Calcium  7.7 (*)  8.4 - 10.5 mg/dL    GFR calc non Af Amer  14 (*)  >90 mL/min    GFR calc Af Amer  16 (*)  >90 mL/min    Comment:  (NOTE)     The eGFR has been calculated using the CKD EPI equation.     This calculation has not been validated in all clinical situations.     eGFR's persistently <90 mL/min signify possible Chronic Kidney     Disease.   MAGNESIUM Status: None    Collection Time    06/19/13 6:10 AM   Result  Value  Ref Range    Magnesium  1.7  1.5 - 2.5 mg/dL   HEPARIN LEVEL (UNFRACTIONATED) Status: None    Collection Time    06/19/13 6:10 AM   Result  Value  Ref Range    Heparin Unfractionated  0.50  0.30 - 0.70 IU/mL    Comment:      IF HEPARIN RESULTS ARE BELOW     EXPECTED VALUES, AND PATIENT     DOSAGE HAS BEEN CONFIRMED,     SUGGEST FOLLOW UP TESTING     OF ANTITHROMBIN III LEVELS.   PROTIME-INR Status: Abnormal    Collection Time    06/19/13 6:10 AM   Result  Value  Ref Range    Prothrombin Time  19.4 (*)  11.6 - 15.2 seconds    INR  1.69 (*)  0.00 - 1.49    No results found.  Post Admission Physician Evaluation:  1. Functional deficits secondary to deconditioning related to kidney transplant failure and rejection. 2. Patient is admitted to receive collaborative, interdisciplinary care between the physiatrist, rehab nursing staff, and therapy team. 3. Patient's level of medical complexity and substantial therapy needs in context of that medical necessity cannot be provided at a lesser intensity  of care such as a SNF. 4. Patient has experienced substantial functional loss from his/her baseline which was documented above under the "Functional History" and "Functional Status" headings. Judging by the patient's diagnosis, physical exam, and functional history, the patient has potential for functional progress which will result in  measurable gains while on inpatient rehab. These gains will be of substantial and practical use upon discharge in facilitating mobility and self-care at the household level. 5. Physiatrist will provide 24 hour management of medical needs as well as oversight of the therapy plan/treatment and provide guidance as appropriate regarding the interaction of the two. 6. 24 hour rehab nursing will assist with bladder management, bowel management, safety, skin/wound care, disease management, medication administration, pain management and patient education and help integrate therapy concepts, techniques,education, etc. 7. PT will assess and treat for/with: pre gait, gait training, endurance , safety, equipment, neuromuscular re education. Goals are: Mod I. 8. OT will assess and treat for/with: ADLs, Cognitive perceptual skills, Neuromuscular re education, safety, endurance, equipment . Goals are: Mod I. 9. Adjustment to disability consider Neuropsych if this doesn't improve with ADL /mob gains 10. Case Management and Social Worker will assess and treat for psychological issues and discharge planning. 11. Team conference will be held weekly to assess progress toward goals and to determine barriers to discharge. 12. Patient will receive at least 3 hours of therapy per day at least 5 days per week. 13. ELOS: 7-9days  14. Prognosis: excellent Medical Problem List and Plan:  1. Deconditioning related to kidney disease and multiple medical issues  2. DVT Prophylaxis/Anticoagulation: Acute DVT of right saphenofemoral junction extending into the common femoral vein. Superficial thrombus right greater saphenous vein. Heparin Coumadin therapy is advised to monitor rate bleeding episodes.  3. Pain Management: Hydrocodone as needed. Monitor with increased mobility  4. Neuropsych: This patient is capable of making decisions on her own behalf.  5. Chronic kidney disease with a history of kidney transplant 9 years ago.  Followup renal services. Continue CellCept 250 mg twice a day and Prograf 1 mg twice a day. Latest creatinine 3.52.  6. Chronic anemia/pancytopenia. Followup CBC transfuse if symptomatic. Aranesp weekly. Monitor platelet counts currently 91,000 related to her CellCept and Prograf. Followup per hematology services   Charlett Blake M.D. [facility] FAAPM&R (Sports Med, Neuromuscular Med) Diplomate Am Board of Electrodiagnostic Med

## 2013-06-20 NOTE — Progress Notes (Signed)
Seen and examined.  Discussed with Dr. Lonny Prude.  Agree with his management and documentation.  OK to DC to CIRF if bed available.  Still on heparin bridge to coumadin.

## 2013-06-20 NOTE — Progress Notes (Signed)
Occupational Therapy Treatment Patient Details Name: [name] MRN: [id number] DOB: March 01, 1960 Today's Date: 06/20/2013    History of present illness [name] is a 53 y.o. female presenting with anemia . PMH is significant for CKD (due to congential issue) s/p renal transplant at [facility], Stroke, Seizures and anemia.   OT comments  Pt making good progress with functional goals and should continue with acute OT services to increase level of function and safety. Pt scheduled to d/c to CIR today or tomorrow  Follow Up Recommendations  CIR    Equipment Recommendations  3 in 1 bedside comode;Other (comment) (ADL A/E kit)    Recommendations for Other Services      Precautions / Restrictions Precautions Precautions: Fall Precaution Comments: Suppressed immune system precautions Restrictions Weight Bearing Restrictions: No       Mobility Bed Mobility               General bed mobility comments: sitting EOB  Transfers Overall transfer level: Needs assistance Equipment used: Rolling walker (2 wheeled) Transfers: Sit to/from Stand Sit to Stand: Min guard              Balance Overall balance assessment: Needs assistance Sitting-balance support: No upper extremity supported;Feet supported Sitting balance-Leahy Scale: Good     Standing balance support: Single extremity supported;No upper extremity supported;During functional activity Standing balance-Leahy Scale: Fair                     ADL       Grooming: Wash/dry hands;Oral care;Brushing hair;Set up;Standing;Min guard   Upper Body Bathing: Set up;Min guard;Standing   Lower Body Bathing: Min guard;Sit to/from stand;Set up;Supervison/ safety   Upper Body Dressing : Set up;Standing;Min guard   Lower Body Dressing: Set up;Min guard;Sit to/from stand   Toilet Transfer: Min guard;Regular Toilet;Grab bars   Toileting- Water quality scientist and Hygiene: Supervision/safety;Sit to/from stand;Min guard       Functional mobility during ADLs: Rolling walker;Min guard General ADL Comments: Pt provided with ADL A/E demonstration. Pt requires increased time to complete bathing and dressing tasks, slow pace sit - stand      Vision  wears reading glasses                              Cognition   Behavior During Therapy: [facility] for tasks assessed/performed Overall Cognitive Status: Within Functional Limits for tasks assessed                                                  General Comments  Pt pleasant, cooperative and highly motivated    Pertinent Vitals/ Pain       No c/o pain, VSS                                                          Frequency Min 2X/week     Progress Toward Goals  OT Goals(current goals can now be found in the care plan section)  Progress towards OT goals: Progressing toward goals     Plan Discharge plan remains appropriate  End of Session Equipment Utilized During Treatment: Rolling walker   Activity Tolerance Patient tolerated treatment well   Patient Left with nursing/sitter in room;with call bell/phone within reach;in chair   Nurse Communication          Time: 0959-1044 OT Time Calculation (min): 45 min  Charges: OT General Charges $OT Visit: 1 Procedure OT Treatments $Self Care/Home Management : 23-37 mins $Therapeutic Activity: 8-22 mins  Denise J Spencer 06/20/2013, 12:02 PM    

## 2013-06-20 NOTE — PMR Pre-admission (Signed)
Pt is appropriate for CIR level rehab program given the combination of medical and functional issues

## 2013-06-21 ENCOUNTER — Inpatient Hospital Stay (HOSPITAL_COMMUNITY): Payer: Medicare Other

## 2013-06-21 ENCOUNTER — Inpatient Hospital Stay (HOSPITAL_COMMUNITY): Payer: Medicare Other | Admitting: *Deleted

## 2013-06-21 DIAGNOSIS — R5381 Other malaise: Secondary | ICD-10-CM

## 2013-06-21 LAB — CBC WITH DIFFERENTIAL/PLATELET
Basophils Absolute: 0 10*3/uL (ref 0.0–0.1)
Basophils Relative: 0 % (ref 0–1)
Eosinophils Absolute: 0.1 10*3/uL (ref 0.0–0.7)
Eosinophils Relative: 1 % (ref 0–5)
HCT: 25.1 % — ABNORMAL LOW (ref 36.0–46.0)
Hemoglobin: 7.9 g/dL — ABNORMAL LOW (ref 12.0–15.0)
Lymphocytes Relative: 24 % (ref 12–46)
Lymphs Abs: 1.1 10*3/uL (ref 0.7–4.0)
MCH: 26.9 pg (ref 26.0–34.0)
MCHC: 31.5 g/dL (ref 30.0–36.0)
MCV: 85.4 fL (ref 78.0–100.0)
Monocytes Absolute: 0.6 10*3/uL (ref 0.1–1.0)
Monocytes Relative: 12 % (ref 3–12)
Neutro Abs: 2.8 10*3/uL (ref 1.7–7.7)
Neutrophils Relative %: 63 % (ref 43–77)
Platelets: 132 10*3/uL — ABNORMAL LOW (ref 150–400)
RBC: 2.94 MIL/uL — ABNORMAL LOW (ref 3.87–5.11)
RDW: 19.5 % — ABNORMAL HIGH (ref 11.5–15.5)
WBC: 4.5 10*3/uL (ref 4.0–10.5)

## 2013-06-21 LAB — COMPREHENSIVE METABOLIC PANEL
ALBUMIN: 2.5 g/dL — AB (ref 3.5–5.2)
ALT: 11 U/L (ref 0–35)
AST: 12 U/L (ref 0–37)
Alkaline Phosphatase: 296 U/L — ABNORMAL HIGH (ref 39–117)
BUN: 51 mg/dL — ABNORMAL HIGH (ref 6–23)
CO2: 20 mEq/L (ref 19–32)
CREATININE: 3.38 mg/dL — AB (ref 0.50–1.10)
Calcium: 8 mg/dL — ABNORMAL LOW (ref 8.4–10.5)
Chloride: 108 mEq/L (ref 96–112)
GFR calc Af Amer: 17 mL/min — ABNORMAL LOW (ref >90)
GFR calc non Af Amer: 14 mL/min — ABNORMAL LOW (ref >90)
Glucose, Bld: 83 mg/dL (ref 70–99)
Potassium: 5.6 mEq/L — ABNORMAL HIGH (ref 3.7–5.3)
Sodium: 138 mEq/L (ref 137–147)
TOTAL PROTEIN: 4.9 g/dL — AB (ref 6.0–8.3)
Total Bilirubin: 0.3 mg/dL (ref 0.3–1.2)

## 2013-06-21 LAB — PROTIME-INR
INR: 2.42 — AB (ref 0.00–1.49)
Prothrombin Time: 25.5 seconds — ABNORMAL HIGH (ref 11.6–15.2)

## 2013-06-21 LAB — MISCELLANEOUS TEST

## 2013-06-21 LAB — HEPARIN LEVEL (UNFRACTIONATED): Heparin Unfractionated: 0.39 IU/mL (ref 0.30–0.70)

## 2013-06-21 MED ORDER — WARFARIN SODIUM 2 MG PO TABS
2.0000 mg | ORAL_TABLET | Freq: Once | ORAL | Status: AC
  Administered 2013-06-21: 2 mg via ORAL
  Filled 2013-06-21: qty 1

## 2013-06-21 NOTE — Evaluation (Signed)
Physical Therapy Assessment and Plan  Patient Details  Name: [name] MRN: [id number] Date of Birth: [birth year]  PT Diagnosis: Difficulty walking, Muscle weakness and Pain in pelvis, Decreased balance, Decreased functional endurance Rehab Potential: Good ELOS: 5-7days   Today's Date: 06/21/2013 Time: 0915-1005 Time Calculation (min): 50 min  Problem List:  Patient Active Problem List   Diagnosis Date Noted  . Physical deconditioning 06/20/2013  . Kidney transplant failure and rejection 06/13/2013    Class: Chronic  . Adverse effect of immunosuppressive drug 06/13/2013    Class: Acute  . Chronic kidney disease, stage IV (severe) 06/13/2013    Class: Chronic  . Protein-calorie malnutrition, severe 06/13/2013  . Anemia 06/12/2013    Past Medical History:  Past Medical History  Diagnosis Date  . Chronic kidney disease   . Anemia   . Stroke   . Hypertension   . Hypercalcemia   . Hypomagnesemia   . Complications of transplanted kidney   . Chronic kidney disease, stage IV (severe)   . Loss of weight   . Pancytopenia   . Adverse effect of immunosuppressive drug    Past Surgical History:  Past Surgical History  Procedure Laterality Date  . Kidney transplant      Assessment & Plan Clinical Impression: [name] is a 53 y.o. 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 Patient has lived in [place] since 2012 and was independent up until September when generalized decline living with her boyfriend. Presented 06/12/2013 with generalized weakness, pancytopenia and recent weight loss. Noted decrease in hemoglobin from 10 on 05/12/2013 to 7.6-5.9. Chest x-ray was negative. Hematology service was consulted 06/14/2013 with workup ongoing question plan for bone marrow biopsy that can be completed as outpatient. Monitor neuro platelet counts currently  91,000 related to CellCept and Prograf. A venous Doppler study completed bilateral lower extremities 06/14/2013 shows acute DVT involving the right saphenofemoral junction extending into the common femoral vein. Superficial thrombus noted in the right greater saphenous vein with Heparin Coumadin initiated 06/15/2013 Renal services follow up latest creatinine 3.47 question plan to resume RRT. Patient has been transfused with latest hemoglobin 7.4 felt to be chronic in nature and monitored closely while on anticoagulations. Palliative care is involved to establish goals of care. Physical and occupational therapy evaluations completed 06/16/2013 with recommendations for physical medicine rehabilitation consult. Patient was admitted for comprehensive rehabilitation program. Patient transferred to CIR on 06/20/2013 .   Patient currently requires Min-Mod A with mobility secondary to muscle weakness and pelvic pain, decreased pelvic mobility, decreased cardiorespiratoy endurance and decreased standing balance.  Prior to hospitalization, patient was modified independent  with mobility and lived with Significant other in a House home.  Home access is 1 stoop stepStairs to enter.  Patient will benefit from skilled PT intervention to maximize safe functional mobility, minimize fall risk and decrease caregiver burden for planned discharge home with intermittent assist.  Anticipate patient will benefit from follow up OP at discharge.  PT - End of Session Activity Tolerance: Tolerates 30+ min activity with multiple rests Endurance Deficit: Yes Endurance Deficit Description:  (had to sit down during dressing) PT Assessment Rehab Potential: Good PT Patient demonstrates impairments in the following area(s): Balance;Endurance;Pain;Safety;Other (comment);Edema (strength) PT Transfers Functional Problem(s): Bed Mobility;Bed to Chair;Car;Furniture PT Locomotion Functional Problem(s): Stairs;Ambulation;Wheelchair  Mobility PT Plan PT Frequency: 5 out of 7 days PT Duration Estimated Length of Stay: 5-7days PT  Treatment/Interventions: Ambulation/gait training;Community reintegration;DME/adaptive equipment instruction;Stair training;UE/LE Strength taining/ROM;Wheelchair propulsion/positioning;UE/LE Coordination activities;Therapeutic Activities;Pain management;Discharge planning;Balance/vestibular training;Cognitive remediation/compensation;Disease management/prevention;Functional mobility training;Patient/family education;Therapeutic Exercise;Psychosocial support;Neuromuscular re-education PT Transfers Anticipated Outcome(s): Supervision-Mod(I) PT Locomotion Anticipated Outcome(s): Supervision-Mod(I) PT Recommendation Recommendations for Other Services: Neuropsych consult Follow Up Recommendations: Outpatient PT Patient destination: Home Equipment Recommended: To be determined  Skilled Therapeutic Intervention 1:1. Pt received sitting in w/c, ready for therapy. PT evaluation performed, see detailed objective information below. Pt educated on rehab environment, role of therapies, goals for physical therapy as well as general safety, pt verbalized understanding as well as conveyed personal goals during her LOS. Tx initiated w/ emphasis on ambulation, w/c propulsion, seq of functional transfers/positioning for improved comfort and pelvic mobility/B LE stretching in supine to address pain/tightness. Pt sitting in w/c at end of session sitting at sink, all needs in reach.   PT Evaluation Precautions/Restrictions Precautions Precautions: Fall Restrictions Weight Bearing Restrictions: No General Chart Reviewed: Yes Family/Caregiver Present: No Vital Signs  Pain Pain Assessment Pain Assessment: 0-10 Pain Score: 3  Pain Type: Acute pain Pain Location: Generalized Pain Intervention(s): Medication (See eMAR) (given at 0701 with prior RN) Home Living/Prior Functioning Home Living Available Help at  Discharge: Family;Available PRN/intermittently Type of Home: House Home Access: Stairs to enter [employer] of Steps: 1 stoop step Home Layout: One level  Lives With: Significant other Prior Function Level of Independence: Independent with basic ADLs;Independent with homemaking with ambulation;Independent with transfers;Independent with gait;Requires assistive device for independence  Able to Take Stairs?: Yes (single step- required increased time for negotiaiton due to pelvic pain/stiffness) Leisure: Hobbies-yes (Comment) Comments: Loves to paint, 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, hospital Vision/Perception     Cognition Overall Cognitive Status: Within Functional Limits for tasks assessed Orientation Level: Oriented X4 Attention: Alternating;Selective Selective Attention: Appears intact Alternating Attention: Appears intact Memory: Appears intact Awareness: Appears intact Problem Solving: Appears intact Safety/Judgment: Appears intact Sensation Sensation Light Touch: Appears Intact Proprioception: Appears Intact Coordination Gross Motor Movements are Fluid and Coordinated: No Fine Motor Movements are Fluid and Coordinated: Yes Coordination and Movement Description: Limited due to pelvic pain/stiffness Motor  Motor Motor: Other (comment) Motor - Skilled Clinical Observations: generalized weakness  Mobility Bed Mobility Bed Mobility: Sit to Supine;Rolling Left;Left Sidelying to Sit Rolling Left: 4: Min assist Rolling Left Details: Verbal cues for sequencing;Verbal cues for technique Left Sidelying to Sit: 3: Mod assist Left Sidelying to Sit Details: Verbal cues for sequencing;Verbal cues for technique Sit to Supine: 3: Mod assist Sit to Supine - Details: Verbal cues for sequencing;Verbal cues for technique Transfers Transfers: Yes Sit to Stand: 4: Min assist;With armrests Sit to Stand Details: Verbal cues for safe use of DME/AE;Verbal cues for precautions/safety Stand to Sit: 5:  Supervision;4: Min guard;With armrests Stand to Sit Details (indicate cue type and reason): Verbal cues for precautions/safety;Tactile cues for placement Stand Pivot Transfers: 4: Min assist Stand Pivot Transfer Details: Verbal cues for safe use of DME/AE Locomotion  Ambulation Ambulation: Yes Ambulation/Gait Assistance: 4: Min guard;5: Supervision Ambulation Distance (Feet): 175 Feet Assistive device: Rolling walker Ambulation/Gait Assistance Details: Verbal cues for safe use of DME/AE Gait Gait: Yes Gait Pattern: Impaired Gait Pattern: Antalgic;Decreased step length - right;Decreased step length - left;Decreased hip/knee flexion - right;Decreased hip/knee flexion - left;Decreased weight shift to left;Decreased weight shift to right Gait velocity: decreased Stairs / Additional Locomotion Stairs: No (Not assessed due to time) Product manager Mobility: Yes Wheelchair Assistance: 5: Supervision Wheelchair Assistance Details: Verbal cues for technique;Verbal cues for sequencing Wheelchair  Propulsion: Both upper extremities Wheelchair Parts Management: Needs assistance;Supervision/cueing Distance: 200'  Trunk/Postural Assessment  Cervical Assessment Cervical Assessment: Within Functional Limits Thoracic Assessment Thoracic Assessment: Within Functional Limits Lumbar Assessment Lumbar Assessment: Within Functional Limits Postural Control Postural Control: Deficits on evaluation Postural Limitations: decreased pelvic mobility due to pain and stiffness  Balance Balance Balance Assessed: Yes Static Sitting Balance Static Sitting - Balance Support: No upper extremity supported;Feet supported Static Sitting - Level of Assistance: 7: Independent Dynamic Sitting Balance Dynamic Sitting - Balance Support: Right upper extremity supported;Left upper extremity supported;Feet supported Dynamic Sitting - Level of Assistance: 5: Stand by assistance Dynamic Sitting - Balance  Activities: Lateral lean/weight shifting;Forward lean/weight shifting;Reaching for objects Static Standing Balance Static Standing - Balance Support: Bilateral upper extremity supported Static Standing - Level of Assistance: 5: Stand by assistance Dynamic Standing Balance Dynamic Standing - Balance Support: During functional activity;Left upper extremity supported;Bilateral upper extremity supported;Right upper extremity supported Dynamic Standing - Level of Assistance: 5: Stand by assistance;4: Min assist Dynamic Standing - Balance Activities: Lateral lean/weight shifting;Forward lean/weight shifting;Reaching for objects Extremity Assessment  RUE Assessment RUE Assessment:  (4/5 strength) LUE Assessment LUE Assessment:  (4/5 strength) RLE Assessment RLE Assessment: Exceptions to [facility] RLE Strength RLE Overall Strength Comments: Not formally assessed due to pelvic pain. Pt demonstrates fair-good functional strength.  LLE Assessment LLE Assessment: Exceptions to [facility] LLE Strength LLE Overall Strength Comments: Not formally assessed due to pelvic pain. Pt demonstrates fair-good functional strength.   FIM:  FIM - Control and instrumentation engineer Devices: Walker;Arm rests Bed/Chair Transfer: 3: Supine > Sit: Mod A (lifting assist/Pt. 50-74%/lift 2 legs;3: Sit > Supine: Mod A (lifting assist/Pt. 50-74%/lift 2 legs);4: Bed > Chair or W/C: Min A (steadying Pt. > 75%);4: Chair or W/C > Bed: Min A (steadying Pt. > 75%) FIM - Locomotion: Wheelchair Distance: 200' Locomotion: Wheelchair: 5: Travels 150 ft or more: maneuvers on rugs and over door sills with supervision, cueing or coaxing FIM - Locomotion: Ambulation Locomotion: Ambulation Assistive Devices: Administrator Ambulation/Gait Assistance: 4: Min guard;5: Supervision Locomotion: Ambulation: 4: Travels 150 ft or more with minimal assistance (Pt.>75%)   Refer to Care Plan for Long Term Goals  Recommendations for other  services: Neuropsych  Discharge Criteria: Patient will be discharged from PT if patient refuses treatment 3 consecutive times without medical reason, if treatment goals not met, if there is a change in medical status, if patient makes no progress towards goals or if patient is discharged from hospital.  The above assessment, treatment plan, treatment alternatives and goals were discussed and mutually agreed upon: by patient  Gilmore Laroche 06/21/2013, 10:22 AM

## 2013-06-21 NOTE — Care Management Note (Signed)
Inpatient Rehabilitation Center Individual Statement of Services  Patient Name:  [name]  Date:  06/21/2013  Welcome to the [facility].  Our goal is to provide you with an individualized program based on your diagnosis and situation, designed to meet your specific needs.  With this comprehensive rehabilitation program, you will be expected to participate in at least 3 hours of rehabilitation therapies Monday-Friday, with modified therapy programming on the weekends.  Your rehabilitation program will include the following services:  Physical Therapy (PT), Occupational Therapy (OT), 24 hour per day rehabilitation nursing, Therapeutic Recreaction (TR), Case Management (Social Worker), Rehabilitation Medicine, Nutrition Services and Pharmacy Services  Weekly team conferences will be held on Tuesdays to discuss your progress.  Your Social Worker will talk with you frequently to get your input and to update you on team discussions.  Team conferences with you and your family in attendance may also be held.  Expected length of stay: 7-10 days  Overall anticipated outcome: Modified Independent  Depending on your progress and recovery, your program may change. Your Social Worker will coordinate services and will keep you informed of any changes. Your Social Worker's name and contact numbers are listed  below.  The following services may also be recommended but are not provided by the [facility] will be made to provide these services after discharge if needed.  Arrangements include referral to agencies that provide these services.  Your insurance has been verified to be:  [employer] Your primary doctor is:  Dr. Posey Pronto (hopes to connect with [facility] to be primary care)  Pertinent information  will be shared with your doctor and your insurance company.  Social Worker:  Lennart Pall, [place] or [phone]   Information discussed with and copy given to patient by: Lennart Pall, 06/21/2013, 3:03 PM

## 2013-06-21 NOTE — Progress Notes (Signed)
Occupational Therapy  Patient Details  Name: Kande Dillahunt MRN: [id number] Date of Birth: May 31, 1960 Today's Date: 06/21/2013  Time   1100-1145  (45 min) Pain:0/10 in shoulders and rib at rest; 6/10 with movement Individual session   Addressed functional mobility, transfers, Pt. Sitting in  wc upon OT arrival.  Donned shirt with set up .  Ambulated with RW to Bathroom with min assist.  Transferred to toilet and went from stand<> sit with min assist.  Stepped over shower ledge with moderate assist and increased time.  Recommended the grab bars for shower for safety.  Pt has a shower seat and curtain at home.   Performed BUE AROM and strengthening.  Pt has stiffness in shoulders and pain with AROM.  Pt. Is 3+/5 BUE and pain 6/10 with movement.  Pt. Also had rib pain with movement.  Had no pain in resting position.   Pt. Reported her goals were:  To be able to walk without walker, get stronger so I can prepare a meal, get in/out of shower, get up and out of bed,do laundry and get my UB/LB stronger.  Pt reported she could not bend over to get her pants on in past so Napolean would set up a chair to sit on and don pants and with multiple rest breaks increased time.  Left pt in wc with call bell and phone in reach.    Lisa Roca 06/21/2013, 8:38 AM

## 2013-06-21 NOTE — Progress Notes (Signed)
Occupational Therapy Note  Patient Details  Name: [name] MRN: [id number] Date of Birth: 1960-05-26 Today's Date: 06/21/2013  Time: 1400-1430 Pt denies pain Individual Therapy  Pt practiced shower transfers into simulated walk-in shower stepping over 4" ledge.  Pt practiced X 3 with different strategies before deciding on most appropriate for home setting.  Recommended grab bar and hand held shower head for home.  Pt verbalized understanding. Pt required rest breaks between each practice.  Focus on discharge planning, discussion of LTGs, functional amb with RW, and shower transfers.   Leroy Libman 06/21/2013, 2:54 PM

## 2013-06-21 NOTE — Progress Notes (Signed)
INITIAL NUTRITION ASSESSMENT  DOCUMENTATION CODES Per approved criteria  -Severe malnutrition in the context of chronic illness -Underweight   Pt meets criteria for severe malnutrition in the context of chronic illness as evidenced by severe fat and muscle wasting.   INTERVENTION: Continue Boost Plus supplement po TID, each supplement providing 360 kcals and 14 grams of protein  Provide snacks throughout the day  Substitute soy milk for regular milk   NUTRITION DIAGNOSIS: Inadequate oral intake related to poor appetite as evidenced by pt report of eating 25% of meals.   Goal: Meet >/=90% of estimated nutrition needs   Monitor:  PO intake, supplement acceptance, weight trends, labs  Reason for Assessment: Consult   53 y.o. female  Admitting Dx: weakness, diarrhea   ASSESSMENT: Pt with history of CKD and chronic anemia s/p kidney transplant at [facility] in 2005.   Pt and care giver provided history of patient. Report to have lost 30 - 40 lbs in 6 months (23%) which is severe for time frame. Pt states to have ongoing diarrhea and poor PO intake.   Pt states to be feeling better and getting a better appetite. Pt reports that she is able to tolerate bland, colder foods. Her usual meal at home is jasmine rice, salmon or steak. She is able to continually eat small more frequent meals throughout the day.   Pt requested literature on how to increase calories and protein. "High Calorie, High Protein Nutrition Therapy" handout from the Academy of Nutrition and Dietetics was given.   Pt states that she likes the boost plus supplement and is tolerating it well.   Elevated Potassium,BUN, Cr  Nutrition Focused Physical Exam:  Subcutaneous Fat:  Orbital Region: severe wasting  Upper Arm Region: severe wasting  Thoracic and Lumbar Region: severe wasting   Muscle:  Temple Region: severe wasting  Clavicle Bone Region: severe wasting  Clavicle and Acromion Bone Region: severe wasting   Scapular Bone Region: severe wasting  Dorsal Hand: severe wasting  Patellar Region: severe wasting  Anterior Thigh Region: severe wasting  Posterior Calf Region: severe wasting   Edema: +1 RLE edema, +1 LLE edema    Height: Ht Readings from Last 1 Encounters:  06/20/13 5\' 3"  (1.6 m)    Weight: Wt Readings from Last 1 Encounters:  06/20/13 101 lb 10.1 oz (46.1 kg)    Ideal Body Weight: 115 lbs   % Ideal Body Weight: 87%  Wt Readings from Last 10 Encounters:  06/20/13 101 lb 10.1 oz (46.1 kg)  06/18/13 97 lb 9.6 oz (44.271 kg)  06/08/11 130 lb (58.968 kg)  05/18/11 130 lb (58.968 kg)    Usual Body Weight: 130   % Usual Body Weight: 77%  BMI:  Body mass index is 18.01 kg/(m^2).  Estimated Nutritional Needs: Kcal: 1400 - 1600 Protein: 67-80 grams  Fluid: >1.5 L/day  Skin: WDL   Diet Order: General  EDUCATION NEEDS: -No education needs identified at this time   Intake/Output Summary (Last 24 hours) at 06/21/13 1324 Last data filed at 06/21/13 1200  Gross per 24 hour  Intake    480 ml  Output      0 ml  Net    480 ml    Last BM: 4/24   Labs:   Recent Labs Lab 06/18/13 0232 06/19/13 0610 06/20/13 0620 06/21/13 0610  NA 142 140 140 138  K 4.5 4.8 5.3 5.6*  CL 112 110 111 108  CO2 18* 16* 18* 20  BUN  52* 53* 52* 51*  CREATININE 3.51* 3.52* 3.44* 3.38*  CALCIUM 7.6* 7.7* 7.6* 8.0*  MG 1.6 1.7 1.6  --   GLUCOSE 85 78 81 83    CBG (last 3)  No results found for this basename: GLUCAP,  in the last 72 hours No results found for this basename: HGBA1C     Scheduled Meds: . calcitRIOL  1 mcg Oral Daily  . calcium carbonate  2 tablet Oral TID  . cinacalcet  30 mg Oral Q breakfast  . [START ON 06/22/2013] darbepoetin (ARANESP) injection - NON-DIALYSIS  200 mcg Subcutaneous Q Sat-1800  . lactose free nutrition  237 mL Oral TID WC  . magnesium oxide  400 mg Oral TID  . multivitamin with minerals  1 tablet Oral Daily  . mycophenolate  250 mg  Oral BID  . potassium chloride SA  40 mEq Oral Daily  . sodium bicarbonate  1,300 mg Oral TID  . tacrolimus  1 mg Oral BID  . warfarin  2 mg Oral ONCE-1800  . Warfarin - Pharmacist Dosing Inpatient   Does not apply q1800    Continuous Infusions:   Past Medical History  Diagnosis Date  . Chronic kidney disease   . Anemia   . Stroke   . Hypertension   . Hypercalcemia   . Hypomagnesemia   . Complications of transplanted kidney   . Chronic kidney disease, stage IV (severe)   . Loss of weight   . Pancytopenia   . Adverse effect of immunosuppressive drug     Past Surgical History  Procedure Laterality Date  . Kidney transplant      Carrolyn Leigh, BS Dietetic Intern Pager: [phone]

## 2013-06-21 NOTE — Progress Notes (Signed)
Patient information reviewed and entered into eRehab system by Grover Woodfield, RN, CRRN, PPS Coordinator.  Information including medical coding and functional independence measure will be reviewed and updated through discharge.     Per nursing patient was given "Data Collection Information Summary for Patients in Inpatient Rehabilitation Facilities with attached "Privacy Act Statement-Health Care Records" upon admission.  

## 2013-06-21 NOTE — Discharge Summary (Signed)
Seen and examined on the day of DC.  Agree with DC plan as outlined by Dr. Berkley Harvey.

## 2013-06-21 NOTE — Progress Notes (Signed)
Coumadin and heparin per pharmacy  Anticoagulation: new R dvt- on heparin and coumadin.  HL 0.39 stable, INR 2.42 (both therapeutic) - today is day #5/5 overlap  INR goal 2-3 6hr heparin level goal 0.3-0.7  Plan - d/c heparin today -Warfarin 2 mg tonight - daily PT/INR

## 2013-06-21 NOTE — Evaluation (Signed)
Occupational Therapy Assessment and Plan  Patient Details  Name: [name] MRN: [id number] Date of Birth: 1960-03-05  OT Diagnosis: muscle weakness (generalized) and pain in joint Rehab Potential: Rehab Potential: Good ELOS: 7-9 days   Today's Date: 06/21/2013 Time: 0800-0915 Time Calculation (min): 75 min  Problem List:  Patient Active Problem List   Diagnosis Date Noted  . Physical deconditioning 06/20/2013  . Kidney transplant failure and rejection 06/13/2013    Class: Chronic  . Adverse effect of immunosuppressive drug 06/13/2013    Class: Acute  . Chronic kidney disease, stage IV (severe) 06/13/2013    Class: Chronic  . Protein-calorie malnutrition, severe 06/13/2013  . Anemia 06/12/2013    Past Medical History:  Past Medical History  Diagnosis Date  . Chronic kidney disease   . Anemia   . Stroke   . Hypertension   . Hypercalcemia   . Hypomagnesemia   . Complications of transplanted kidney   . Chronic kidney disease, stage IV (severe)   . Loss of weight   . Pancytopenia   . Adverse effect of immunosuppressive drug    Past Surgical History:  Past Surgical History  Procedure Laterality Date  . Kidney transplant      Assessment & Plan Clinical Impression:  [name] is a 53 y.o. 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 Patient has lived in [place] since 2012 and was independent up until September when generalized decline living with her boyfriend. Presented 06/12/2013 with generalized weakness, pancytopenia and recent weight loss. Noted decrease in hemoglobin from 10 on 05/12/2013 to 7.6-5.9. Chest x-ray was negative. Hematology service was consulted 06/14/2013 with workup ongoing question plan for bone marrow biopsy that can be completed as outpatient. Monitor neuro platelet counts currently 91,000 related to CellCept and Prograf.  A venous Doppler study completed bilateral lower extremities 06/14/2013 shows acute DVT involving the right saphenofemoral junction extending into the common femoral vein. Superficial thrombus noted in the right greater saphenous vein with Heparin Coumadin initiated 06/15/2013 Renal services follow up latest creatinine 3.47 question plan to resume RRT. Patient has been transfused with latest hemoglobin 7.4 felt to be chronic in nature and monitored closely while on anticoagulations. Palliative care is involved to establish goals of care  Patient transferred to CIR on 06/20/2013 .    Patient currently requires mod with basic self-care skills secondary to muscle weakness and muscle joint tightness.  Prior to hospitalization, patient could complete BADL/IADL with modified independent .  Patient will benefit from skilled intervention to increase independence with basic self-care skills and increase level of independence with iADL prior to discharge home with care partner.  Anticipate patient will require intermittent supervision and follow up home health.  OT - End of Session Activity Tolerance: Tolerates 10 - 20 min activity with multiple rests Endurance Deficit: Yes Endurance Deficit Description:  (had to sit down during dressing) OT Assessment Rehab Potential: Good Barriers to Discharge: Decreased caregiver support OT Patient demonstrates impairments in the following area(s): Balance;Endurance;Motor;Safety OT Basic ADL's Functional Problem(s): Grooming;Bathing;Dressing;Toileting OT Advanced ADL's Functional Problem(s): Simple Meal Preparation;Laundry;Light Housekeeping OT Transfers Functional Problem(s): Toilet;Tub/Shower OT Additional Impairment(s): Other (comment) (increased UB strength ) OT Plan OT Intensity: Minimum of 1-2 x/day, 45 to 90 minutes OT Frequency: 5 out of 7 days OT Duration/Estimated Length of Stay: 7-9 days OT Treatment/Interventions: Medical illustrator training;Community  reintegration;Discharge planning;Functional mobility training;DME/adaptive equipment instruction;Patient/family education;Self Care/advanced ADL retraining;Therapeutic  Activities;Therapeutic Exercise;UE/LE Strength taining/ROM OT Self Feeding Anticipated Outcome(s): independent OT Basic Self-Care Anticipated Outcome(s): mod I OT Toileting Anticipated Outcome(s): mod i OT Bathroom Transfers Anticipated Outcome(s): mod I OT Recommendation Patient destination: Home Follow Up Recommendations: Home health OT Equipment Recommended: grab bars and HH shower hose  Skilled Therapeutic Intervention Addressed  functional mobility with RW, toilet transfers and shower transfers.   Pt wants to be at mod I by dc.  Discussed goals and POC.  Pt.would benefit from grab bar and HH shower hose in her shower stall.    OT Evaluation Precautions/Restrictions  Precautions Precautions: Fall Restrictions Weight Bearing Restrictions: No General   Vital Signs Therapy Vitals  wfl Pain Pain Assessment Pain Assessment: 0-10 Pain Score: 3 at rest;  7/10 with UE movement Pain Type: Acute pain Pain Location: Generalized Pain Intervention(s): Medication (See eMAR) Home Living/Prior [place] expects to be discharged to:: Private residence Living Arrangements: Spouse/significant other Available Help at Discharge: Family;Available PRN/intermittently Type of Home: House Home Access: Stairs to enter Entrance Stairs-Number of Steps: 1 stoop step Home Layout: One level IADL History Homemaking Responsibilities: Yes Meal Prep Responsibility: Primary Laundry Responsibility: Primary Cleaning Responsibility: Primary Bill Paying/Finance Responsibility: Primary Shopping Responsibility: Primary Current License: No Mode of Transportation: Car (pt has not driven since Nov 2542) Education: Licensed conveyancer Occupation: On disability Leisure and Hobbies:  (art, writing , dancing, exerice) Prior  Function Level of Independence: Independent with basic ADLs;Independent with homemaking with ambulation Leisure: Hobbies-yes (Comment) ADL   Vision/Perception  Vision- History Baseline Vision/History: Wears glasses Wears Glasses: Reading only Vision- Assessment Vision Assessment?: No apparent visual deficits  Cognition Overall Cognitive Status: Within Functional Limits for tasks assessed Orientation Level: Oriented X4 Sensation Sensation Light Touch: Appears Intact Motor  Motor Motor: Other (comment) Motor - Skilled Clinical Observations: generalized weakness Mobility  Transfers Sit to Stand: 4: Min assist Sit to Stand Details: Tactile cues for placement Stand to Sit: 5: Supervision;Without upper extremity assist Stand to Sit Details (indicate cue type and reason): Verbal cues for precautions/safety  Trunk/Postural Assessment  Cervical Assessment Cervical Assessment: Within Functional Limits Thoracic Assessment Thoracic Assessment: Within Functional Limits Lumbar Assessment Lumbar Assessment: Within Functional Limits Postural Control Postural Control: Deficits on evaluation Postural Limitations: decreased pelvic mobility  Balance Balance Balance Assessed: Yes Static Sitting Balance Static Sitting - Balance Support: No upper extremity supported;Feet supported Static Sitting - Level of Assistance: 7: Independent Static Standing Balance Static Standing - Level of Assistance: 4: Min assist Dynamic Standing Balance Dynamic Standing - Balance Support: During functional activity;Right upper extremity supported Dynamic Standing - Level of Assistance: 4: Min assist Dynamic Standing - Balance Activities: Lateral lean/weight shifting;Forward lean/weight shifting Extremity/Trunk Assessment RUE Assessment RUE Assessment:  (4/5 strength) LUE Assessment LUE Assessment:  (4/5 strength)  FIM:  FIM - Eating Eating Activity: 7: Complete independence:no helper FIM -  Grooming Grooming Steps: Wash, rinse, dry face;Wash, rinse, dry hands Grooming: 5: Set-up assist to obtain items FIM - Bathing Bathing Steps Patient Completed: Chest;Right Arm;Left Arm;Abdomen;Front perineal area;Buttocks;Right upper leg;Left upper leg Bathing: 4: Min-Patient completes 8-9 [id number] parts or 75+ percent FIM - Upper Body Dressing/Undressing Upper body dressing/undressing steps patient completed: Thread/unthread right sleeve of pullover shirt/dresss;Thread/unthread left sleeve of pullover shirt/dress;Put head through opening of pull over shirt/dress;Pull shirt over trunk Upper body dressing/undressing: 4: Min-Patient completed 75 plus % of tasks FIM - Lower Body Dressing/Undressing Lower body dressing/undressing steps patient completed: Thread/unthread right underwear leg;Thread/unthread left underwear leg;Pull underwear up/down;Thread/unthread left pants  leg;Pull pants up/down;Fasten/unfasten pants Lower body dressing/undressing: 4: Min-Patient completed 75 plus % of tasks FIM - Control and instrumentation engineer Devices: Walker;Arm rests Bed/Chair Transfer: 3: Supine > Sit: Mod A (lifting assist/Pt. 50-74%/lift 2 legs;3: Sit > Supine: Mod A (lifting assist/Pt. 50-74%/lift 2 legs);4: Bed > Chair or W/C: Min A (steadying Pt. > 75%);4: Chair or W/C > Bed: Min A (steadying Pt. > 75%) FIM - Radio producer Devices: Elevated toilet seat;Walker FIM - Tub/Shower Transfers Tub/shower Transfers: 0-Activity did not occur or was simulated   Refer to Care Plan for Long Term Goals  Recommendations for other services: None  Discharge Criteria: Patient will be discharged from OT if patient refuses treatment 3 consecutive times without medical reason, if treatment goals not met, if there is a change in medical status, if patient makes no progress towards goals or if patient is discharged from hospital.  The above assessment, treatment plan, treatment  alternatives and goals were discussed and mutually agreed upon: by patient  Lisa Roca 06/21/2013, 12:07 PM

## 2013-06-21 NOTE — Progress Notes (Signed)
[facility] PHYSICAL MEDICINE & REHABILITATION     PROGRESS NOTE    Subjective/Complaints: Had a pretty good night. Denies pain. Anxious to start therapies today.   Objective: Vital Signs: Blood pressure 93/56, pulse 74, temperature 98.5 F (36.9 C), temperature source Oral, resp. rate 16, height 5\' 3"  (1.6 m), weight 46.1 kg (101 lb 10.1 oz), SpO2 98.00%. No results found.  Recent Labs  06/20/13 0620 06/21/13 0610  WBC 4.1 4.5  HGB 7.0* 7.9*  HCT 22.1* 25.1*  PLT 105* 132*    Recent Labs  06/19/13 0610 06/20/13 0620  NA 140 140  K 4.8 5.3  CL 110 111  GLUCOSE 78 81  BUN 53* 52*  CREATININE 3.52* 3.44*  CALCIUM 7.7* 7.6*   CBG (last 3)  No results found for this basename: GLUCAP,  in the last 72 hours  Wt Readings from Last 3 Encounters:  06/20/13 46.1 kg (101 lb 10.1 oz)  06/18/13 44.271 kg (97 lb 9.6 oz)  06/08/11 58.968 kg (130 lb)    Physical Exam:  Constitutional: She is oriented to person, place, and time. Frail appearing HENT: oral mucosa pink and moist Head: Normocephalic.  Eyes: EOM are normal.  Neck: Normal range of motion. Neck supple. No tracheal deviation present. No thyromegaly present.  Cardiovascular: Normal rate and regular rhythm.  Respiratory: Effort normal and breath sounds normal. No respiratory distress.  GI: Soft. Bowel sounds are normal. She exhibits no distension.  Neurological: She is alert and oriented to person, place, and time. Normal insight and awareness. No cranial nerve deficit. She exhibits normal muscle tone. Coordination normal.  UE 4- deltoid, 4 bicep and tricep, 4+HI, HF 3+, KE 4, ankles 4+. No sensory findings  Skin: Skin is warm and dry.  Psychiatric: She has a normal mood and affect    Assessment/Plan: 1. Functional deficits secondary to deconditioning related to kidney transplant failure and rejection which require 3+ hours per day of interdisciplinary therapy in a comprehensive inpatient rehab  setting. Physiatrist is providing close team supervision and 24 hour management of active medical problems listed below. Physiatrist and rehab team continue to assess barriers to discharge/monitor patient progress toward functional and medical goals. FIM:          FIM - Radio producer Devices: Recruitment consultant Transfers: 7-To toilet/BSC        Comprehension Comprehension Mode: Auditory Comprehension: 7-Follows complex conversation/direction: With no assist  Expression Expression Mode: Verbal Expression: 7-Expresses complex ideas: With no assist  Social Interaction Social Interaction: 7-Interacts appropriately with others - No medications needed.  Problem Solving Problem Solving: 7-Solves complex problems: Recognizes & self-corrects  Memory Memory: 7-Complete Independence: No helper  Medical Problem List and Plan:  1. Deconditioning related to kidney disease and multiple medical issues  2. DVT Prophylaxis/Anticoagulation: Acute DVT of right saphenofemoral junction extending into the common femoral vein. Superficial thrombus right greater saphenous vein. Heparin to Coumadin therapy  3. Pain Management: Hydrocodone as needed. Monitor with increased mobility  4. Neuropsych: This patient is capable of making decisions on her own behalf.  5. Chronic kidney disease with a history of kidney transplant 9 years ago. Followup renal services. Continue CellCept 250 mg twice a day and Prograf 1 mg twice a day. Latest creatinine 3.44  6. Chronic anemia/pancytopenia. -hgb 7.9 today  -Transfuse if symptomatic.  - Aranesp weekly.   -Monitor platelet counts currently 91,000 related to her CellCept and Prograf.   -Followup per hematology services  LOS (Days) 1 A FACE TO FACE EVALUATION WAS PERFORMED  [name] 06/21/2013 7:24 AM

## 2013-06-22 ENCOUNTER — Inpatient Hospital Stay (HOSPITAL_COMMUNITY): Payer: Medicare Other | Admitting: Physical Therapy

## 2013-06-22 DIAGNOSIS — N184 Chronic kidney disease, stage 4 (severe): Secondary | ICD-10-CM

## 2013-06-22 DIAGNOSIS — D649 Anemia, unspecified: Secondary | ICD-10-CM

## 2013-06-22 DIAGNOSIS — R5381 Other malaise: Secondary | ICD-10-CM

## 2013-06-22 LAB — PROTIME-INR
INR: 2.28 — ABNORMAL HIGH (ref 0.00–1.49)
Prothrombin Time: 24.4 seconds — ABNORMAL HIGH (ref 11.6–15.2)

## 2013-06-22 MED ORDER — WARFARIN SODIUM 2.5 MG PO TABS
2.5000 mg | ORAL_TABLET | Freq: Once | ORAL | Status: AC
  Administered 2013-06-22: 2.5 mg via ORAL
  Filled 2013-06-22: qty 1

## 2013-06-22 NOTE — Progress Notes (Signed)
Coumadin per pharmacy  Anticoagulation: new R dvt- on Coumadin. INR 2.28. Completed 5 day overlap with heparin on 4/24.   INR goal 2-3  Plan -Warfarin 2.5 mg tonight - daily PT/INR  Albertina Parr, PharmD.  Clinical Pharmacist Pager [phone]

## 2013-06-22 NOTE — Progress Notes (Signed)
Patient ID: [name], female   DOB: 05-03-1960, 53 y.o.   MRN: [id number]    [facility] PHYSICAL MEDICINE & REHABILITATION     PROGRESS NOTE   06/22/13.  Subjective/Complaints:  53 year old patient admitted to rehabilitation for deconditioning due to the chronic kidney disease and multiple medical issues.  Anemia has improved with hemoglobin 7.9 today compared with 7 g percent yesterday Had a pretty good night. Denies pain.   Past Medical History  Diagnosis Date  . Chronic kidney disease   . Anemia   . Stroke   . Hypertension   . Hypercalcemia   . Hypomagnesemia   . Complications of transplanted kidney   . Chronic kidney disease, stage IV (severe)   . Loss of weight   . Pancytopenia   . Adverse effect of immunosuppressive drug     History   Social History  . Marital Status: Divorced    Spouse Name: N/A    Number of Children: N/A  . Years of Education: N/A   Occupational History  . Not on file.   Social History Main Topics  . Smoking status: Never Smoker   . Smokeless tobacco: Never Used  . Alcohol Use: No  . Drug Use: No  . Sexual Activity: Not on file   Other Topics Concern  . Not on file   Social History Narrative  . No narrative on file    Past Surgical History  Procedure Laterality Date  . Kidney transplant      No family history on file.  Allergies  Allergen Reactions  . Cephalosporins Diarrhea and Nausea Only  . Erythromycin Diarrhea and Nausea Only  . Fexofenadine-Pseudoephed Er Other (See Comments)    dizzy    No current facility-administered medications on file prior to encounter.   Current Outpatient Prescriptions on File Prior to Encounter  Medication Sig Dispense Refill  . acetaminophen (TYLENOL) 160 MG/5ML solution Take 20.3 mLs (650 mg total) by mouth every 8 (eight) hours as needed for mild pain.  120 mL  0  . calcitRIOL (ROCALTROL) 0.5 MCG capsule Take 2 capsules (1 mcg total) by mouth daily.  60 capsule  0  . calcium  carbonate (HEALTHY MAMA TAME THE FLAME) 500 MG chewable tablet Chew 2 tablets (400 mg of elemental calcium total) by mouth 3 (three) times daily.  180 tablet  0  . cinacalcet (SENSIPAR) 30 MG tablet Take 1 tablet (30 mg total) by mouth daily with breakfast.  60 tablet  0  . darbepoetin (ARANESP) 200 MCG/0.4ML SOLN injection Inject 0.4 mLs (200 mcg total) into the skin every Saturday at 6 PM.  1.68 mL  0  . heparin 100-0.45 UNIT/ML-% infusion Inject 1,000 Units/hr into the vein continuous.  250 mL    . HYDROcodone-acetaminophen (NORCO/VICODIN) 5-325 MG per tablet Take 1-2 tablets by mouth every 4 (four) hours as needed for moderate pain.  30 tablet  0  . lactose free nutrition (BOOST) LIQD Take 237 mLs by mouth 3 (three) times daily between meals.      . magnesium oxide (MAG-OX) 400 MG tablet Take 400 mg by mouth 3 (three) times daily.       . Multiple Vitamin (MULTI-VITAMINS) TABS Take 1 tablet by mouth daily.      . mycophenolate (CELLCEPT) 250 MG capsule Take 1 capsule (250 mg total) by mouth 2 (two) times daily.  60 capsule  0  . potassium chloride SA (K-DUR,KLOR-CON) 20 MEQ tablet Take 2 tablets (40 mEq total) by  mouth daily.  30 tablet  0  . sodium bicarbonate 650 MG tablet Take 2 tablets (1,300 mg total) by mouth 3 (three) times daily.  180 tablet  0  . tacrolimus (PROGRAF) 1 MG capsule Take 1 capsule (1 mg total) by mouth 2 (two) times daily.  60 capsule  0  . warfarin (COUMADIN) 2 MG tablet Take 1 tablet (2 mg total) by mouth one time only at 6 PM.        BP 90/51  Pulse 78  Temp(Src) 98.7 F (37.1 C) (Oral)  Resp 15  Ht 5\' 3"  (1.6 m)  Wt 46.1 kg (101 lb 10.1 oz)  BMI 18.01 kg/m2  SpO2 98%  Patient Vitals for the past 24 hrs:  BP Temp Temp src Pulse Resp SpO2  06/22/13 0508 90/51 mmHg 98.7 F (37.1 C) Oral 78 15 98 %  06/21/13 2100 98/64 mmHg - - - - -  06/21/13 2048 89/59 mmHg 98.4 F (36.9 C) Oral 84 16 97 %  06/21/13 1515 97/64 mmHg 97.5 F (36.4 C) Oral 78 16 100 %      Intake/Output Summary (Last 24 hours) at 06/22/13 0837 Last data filed at 06/21/13 1900  Gross per 24 hour  Intake    840 ml  Output      0 ml  Net    840 ml    Objective: Vital Signs: Blood pressure 90/51, pulse 78, temperature 98.7 F (37.1 C), temperature source Oral, resp. rate 15, height 5\' 3"  (1.6 m), weight 46.1 kg (101 lb 10.1 oz), SpO2 98.00%. No results found.  Recent Labs  06/20/13 0620 06/21/13 0610  WBC 4.1 4.5  HGB 7.0* 7.9*  HCT 22.1* 25.1*  PLT 105* 132*    Recent Labs  06/20/13 0620 06/21/13 0610  NA 140 138  K 5.3 5.6*  CL 111 108  GLUCOSE 81 83  BUN 52* 51*  CREATININE 3.44* 3.38*  CALCIUM 7.6* 8.0*   CBG (last 3)  No results found for this basename: GLUCAP,  in the last 72 hours  Wt Readings from Last 3 Encounters:  06/20/13 46.1 kg (101 lb 10.1 oz)  06/18/13 44.271 kg (97 lb 9.6 oz)  06/08/11 58.968 kg (130 lb)    Physical Exam:  Constitutional: She is oriented to person, place, and time. Frail appearing;  HENT: oral mucosa pink and moist Head: Normocephalic.  Eyes: EOM are normal.  Neck: Normal range of motion. Neck supple. No tracheal deviation present. No thyromegaly present.  Cardiovascular: Normal rate and regular rhythm.  Respiratory: Effort normal and breath sounds normal. No respiratory distress.  Decreased breath sounds right base GI: Soft. Bowel sounds are normal. She exhibits no distension.  Neurological: She is alert and oriented to person, place, and time. Normal insight and awareness. No cranial nerve deficit. She exhibits normal muscle tone. Coordination normal.  Extremities  mild pedal edema  Skin: Skin is warm and dry.  Psychiatric: She has a normal mood and affect    Assessment/Plan: 1. Functional deficits secondary to deconditioning related to kidney transplant failure and rejection which require 3+ hours per day of interdisciplinary therapy in a comprehensive inpatient rehab setting. 2. Chronic kidney  disease with a history of kidney transplant 9 years ago. Followup renal services. Continue CellCept 250 mg twice a day and Prograf 1 mg twice a day. Latest creatinine 3.44  3. Chronic anemia/pancytopenia. -hgb 7.9 today  -Transfuse if symptomatic.  - Aranesp weekly.   -Monitor platelet counts currently 91,000  related to her CellCept and Prograf.   -Followup per hematology services      LOS (Days) 2 A FACE TO FACE EVALUATION WAS PERFORMED  Marletta Lor 06/22/2013 8:35 AM

## 2013-06-22 NOTE — Progress Notes (Signed)
Physical Therapy Session Note  Patient Details  Name: [name] MRN: [id number] Date of Birth: 1961/02/28  Today's Date: 06/22/2013 Time: 1510-1550 Time Calculation (min): 40 min  Short Term Goals: Week 1:  PT Short Term Goal 1 (Week 1): STGs=LTGs due to anticipated LOS  Skilled Therapeutic Interventions/Progress Updates:   Pt received sitting in w/c visiting with family, excited and motivated for therapy. Session focused on endurance and overall strengthening/flexibility. Pt with report of pain "in all the joints," but especially shoulders/hips. W/c propulsion 50' using BUEs and supervision, limited by shoulder joint pain. Gait training 100 ft using RW and min guard-supervision. Negotiated up/down three 5" stairs using 2 rails and min assist with greatly increased time as pt reports she "wants to do it by herself." Pt interested in HEP to perform in room due to c/o stiffness. Discussed flexibility/stretching HEP for patient, performed overhead shoulder ROM exercises, seated gastroc/soleus stretch, and demonstrated supine pec stretch; will f/u with handout tomorrow. NuStep Level 2-3 x 5 min for UE/LE flexibility and strengthening. Pt left sitting in w/c with family present in room.   Therapy Documentation Precautions:  Precautions Precautions: Fall Restrictions Weight Bearing Restrictions: No Pain: Pain Assessment Pain Assessment: 0-10 Pain Score: 4  Pain Type: Acute pain Pain Location: Pelvis Pain Descriptors / Indicators: Aching;Discomfort (stiff) Pain Onset: With Activity (stair negotiation) Pain Intervention(s): Repositioned;Rest Locomotion : Ambulation Ambulation/Gait Assistance: 4: Min guard;5: Supervision Wheelchair Mobility Distance: 50   See FIM for current functional status  Therapy/Group: Individual Therapy  Laretta Alstrom 06/22/2013, 4:16 PM

## 2013-06-23 ENCOUNTER — Encounter (HOSPITAL_COMMUNITY): Payer: Medicare Other | Admitting: Occupational Therapy

## 2013-06-23 ENCOUNTER — Inpatient Hospital Stay (HOSPITAL_COMMUNITY): Payer: Medicare Other | Admitting: Occupational Therapy

## 2013-06-23 ENCOUNTER — Inpatient Hospital Stay (HOSPITAL_COMMUNITY): Payer: Medicare Other | Admitting: Physical Therapy

## 2013-06-23 LAB — PROTIME-INR
INR: 2.8 — AB (ref 0.00–1.49)
PROTHROMBIN TIME: 28.5 s — AB (ref 11.6–15.2)

## 2013-06-23 LAB — BASIC METABOLIC PANEL
BUN: 53 mg/dL — ABNORMAL HIGH (ref 6–23)
CO2: 22 meq/L (ref 19–32)
CREATININE: 3.47 mg/dL — AB (ref 0.50–1.10)
Calcium: 8 mg/dL — ABNORMAL LOW (ref 8.4–10.5)
Chloride: 104 mEq/L (ref 96–112)
GFR calc non Af Amer: 14 mL/min — ABNORMAL LOW (ref >90)
GFR, EST AFRICAN AMERICAN: 16 mL/min — AB (ref >90)
Glucose, Bld: 79 mg/dL (ref 70–99)
Potassium: 6.3 mEq/L — ABNORMAL HIGH (ref 3.7–5.3)
Sodium: 137 mEq/L (ref 137–147)

## 2013-06-23 MED ORDER — SODIUM POLYSTYRENE SULFONATE 15 GM/60ML PO SUSP
30.0000 g | Freq: Once | ORAL | Status: AC
  Administered 2013-06-23: 30 g via ORAL
  Filled 2013-06-23: qty 120

## 2013-06-23 MED ORDER — ACETAMINOPHEN 325 MG PO TABS
650.0000 mg | ORAL_TABLET | Freq: Three times a day (TID) | ORAL | Status: DC | PRN
  Administered 2013-06-23 – 2013-06-28 (×5): 650 mg via ORAL
  Filled 2013-06-23 (×5): qty 2

## 2013-06-23 MED ORDER — WARFARIN SODIUM 2 MG PO TABS
2.0000 mg | ORAL_TABLET | Freq: Once | ORAL | Status: AC
  Administered 2013-06-23: 2 mg via ORAL
  Filled 2013-06-23: qty 1

## 2013-06-23 NOTE — Progress Notes (Signed)
Occupational Therapy Session Note  Patient Details  Name: [name] MRN: [id number] Date of Birth: 1960-12-30  Today's Date: 06/23/2013 Time:  - 8:00-9:00am     Skilled Therapeutic Interventions/Progress Updates:    1:1 self care retraining at shower level with focus on functional ambulation from bed to bathroom, shower stall transfer stepping over simulated shower stall ledge backwards, sit to stands, standing balance without UE support to doff UB clothing, activity tolerance, etc. Pt with left shoulder discomfort with using the hand held shower head in the shower due to its upper placement on the wall. Pt reports stiffness this morning due to being in the bed last night and all the activities performed yesterday. Continued discussing d/c planning and home setup for success in her home environment. Pt ambulates slowly due to pain- discussed option of dressing in the bathroom seated before returning to her bedroom. Pt required A with LB dressing due to limited time due to requiring more than reasonable amt of time to complete self care tasks. Pt needs extra time for sit<>stands. Bilateral feet swollen right >left so A needed to don shoes. Discussed again using AE for LB B/D.  Therapy Documentation Precautions:  Precautions Precautions: Fall Restrictions Weight Bearing Restrictions: No Pain:  bilateral hips and shoulders are sore  See FIM for current functional status  Therapy/Group: Individual Therapy  Merrilee Seashore 06/23/2013, 8:30 AM

## 2013-06-23 NOTE — Progress Notes (Signed)
Patient ID: [name], female   DOB: 21-Jun-1960, 53 y.o.   MRN: [id number]  Patient ID: [name], female   DOB: April 17, 1960, 53 y.o.   MRN: [id number]    [place] [facility]ICINE & REHABILITATION     PROGRESS NOTE   06/23/13.  Subjective/Complaints:  53 year old patient admitted to rehabilitation for treatment of deconditioning due to the chronic kidney disease and multiple medical issues.  Anemia has improved with hemoglobin 7.9 yesterday compared with 7 g percent  the day prior Had a pretty good night. Denies pain.  No new concerns or complaints today.  Comfortable night. Potassium 6.3 today  Past Medical History  Diagnosis Date  . Chronic kidney disease   . Anemia   . Stroke   . Hypertension   . Hypercalcemia   . Hypomagnesemia   . Complications of transplanted kidney   . Chronic kidney disease, stage IV (severe)   . Loss of weight   . Pancytopenia   . Adverse effect of immunosuppressive drug     History   Social History  . Marital Status: Divorced    Spouse Name: N/A    Number of Children: N/A  . Years of Education: N/A   Occupational History  . Not on file.   Social History Main Topics  . Smoking status: Never Smoker   . Smokeless tobacco: Never Used  . Alcohol Use: No  . Drug Use: No  . Sexual Activity: Not on file   Other Topics Concern  . Not on file   Social History Narrative  . No narrative on file    Past Surgical History  Procedure Laterality Date  . Kidney transplant      No family history on file.  Allergies  Allergen Reactions  . Cephalosporins Diarrhea and Nausea Only  . Erythromycin Diarrhea and Nausea Only  . Fexofenadine-Pseudoephed Er Other (See Comments)    dizzy    No current facility-administered medications on file prior to encounter.   Current Outpatient Prescriptions on File Prior to Encounter  Medication Sig Dispense Refill  . acetaminophen (TYLENOL) 160 MG/5ML solution Take 20.3 mLs (650 mg total) by mouth  every 8 (eight) hours as needed for mild pain.  120 mL  0  . calcitRIOL (ROCALTROL) 0.5 MCG capsule Take 2 capsules (1 mcg total) by mouth daily.  60 capsule  0  . calcium carbonate (HEALTHY MAMA TAME THE FLAME) 500 MG chewable tablet Chew 2 tablets (400 mg of elemental calcium total) by mouth 3 (three) times daily.  180 tablet  0  . cinacalcet (SENSIPAR) 30 MG tablet Take 1 tablet (30 mg total) by mouth daily with breakfast.  60 tablet  0  . darbepoetin (ARANESP) 200 MCG/0.4ML SOLN injection Inject 0.4 mLs (200 mcg total) into the skin every Saturday at 6 PM.  1.68 mL  0  . heparin 100-0.45 UNIT/ML-% infusion Inject 1,000 Units/hr into the vein continuous.  250 mL    . HYDROcodone-acetaminophen (NORCO/VICODIN) 5-325 MG per tablet Take 1-2 tablets by mouth every 4 (four) hours as needed for moderate pain.  30 tablet  0  . lactose free nutrition (BOOST) LIQD Take 237 mLs by mouth 3 (three) times daily between meals.      . magnesium oxide (MAG-OX) 400 MG tablet Take 400 mg by mouth 3 (three) times daily.       . Multiple Vitamin (MULTI-VITAMINS) TABS Take 1 tablet by mouth daily.      . mycophenolate (CELLCEPT) 250 MG capsule  Take 1 capsule (250 mg total) by mouth 2 (two) times daily.  60 capsule  0  . potassium chloride SA (K-DUR,KLOR-CON) 20 MEQ tablet Take 2 tablets (40 mEq total) by mouth daily.  30 tablet  0  . sodium bicarbonate 650 MG tablet Take 2 tablets (1,300 mg total) by mouth 3 (three) times daily.  180 tablet  0  . tacrolimus (PROGRAF) 1 MG capsule Take 1 capsule (1 mg total) by mouth 2 (two) times daily.  60 capsule  0  . warfarin (COUMADIN) 2 MG tablet Take 1 tablet (2 mg total) by mouth one time only at 6 PM.        BP 92/55  Pulse 81  Temp(Src) 98.1 F (36.7 C) (Oral)  Resp 16  Ht 5\' 3"  (1.6 m)  Wt 46.1 kg (101 lb 10.1 oz)  BMI 18.01 kg/m2  SpO2 100%  Patient Vitals for the past 24 hrs:  BP Temp Temp src Pulse Resp SpO2  06/23/13 0515 92/55 mmHg 98.1 F (36.7 C) Oral 81  16 100 %  06/22/13 2100 95/67 mmHg 97.8 F (36.6 C) Oral 81 16 100 %  06/22/13 1553 98/58 mmHg 97.5 F (36.4 C) Oral 67 16 99 %     Intake/Output Summary (Last 24 hours) at 06/23/13 0751 Last data filed at 06/22/13 1700  Gross per 24 hour  Intake    600 ml  Output      0 ml  Net    600 ml    Objective: Vital Signs: Blood pressure 92/55, pulse 81, temperature 98.1 F (36.7 C), temperature source Oral, resp. rate 16, height 5\' 3"  (1.6 m), weight 46.1 kg (101 lb 10.1 oz), SpO2 100.00%. No results found.  Recent Labs  06/21/13 0610  WBC 4.5  HGB 7.9*  HCT 25.1*  PLT 132*    Recent Labs  06/21/13 0610 06/23/13 0545  NA 138 137  K 5.6* 6.3*  CL 108 104  GLUCOSE 83 79  BUN 51* 53*  CREATININE 3.38* 3.47*  CALCIUM 8.0* 8.0*   CBG (last 3)  No results found for this basename: GLUCAP,  in the last 72 hours  Wt Readings from Last 3 Encounters:  06/20/13 46.1 kg (101 lb 10.1 oz)  06/18/13 44.271 kg (97 lb 9.6 oz)  06/08/11 58.968 kg (130 lb)    Physical Exam:  Constitutional: She is oriented to person, place, and time. Frail appearing;  HENT: oral mucosa pink and moist Head: Normocephalic.  Eyes: EOM are normal.  Neck: Normal range of motion. Neck supple. No tracheal deviation present. No thyromegaly present.  Cardiovascular: Normal rate and regular rhythm.  Respiratory: Effort normal and breath sounds normal. No respiratory distress. GI: Soft. Bowel sounds are normal. She exhibits no distension.  Neurological: She is alert and oriented to person, place, and time. Normal insight and awareness. No cranial nerve deficit. She exhibits normal muscle tone. Coordination normal.  Extremities  mild pedal edema  Skin: Skin is warm and dry.  Psychiatric: She has a normal mood and affect    Assessment/Plan: 1. Functional deficits secondary to deconditioning related to kidney transplant failure and rejection which require 3+ hours per day of interdisciplinary therapy in a  comprehensive inpatient rehab setting. 2. Chronic kidney disease with a history of kidney transplant 9 years ago. Followup renal services. Continue CellCept 250 mg twice a day and Prograf 1 mg twice a day. Latest creatinine 3.44  Hyperkalemia.  We'll treat with 2 dosages of Kayexalate.  Continue sodium bicarbonate 3. Chronic anemia/pancytopenia. -hgb 7.9 yesterday  -Transfuse if symptomatic.  - Aranesp weekly.   -Monitor platelet counts currently 91,000 related to her CellCept and Prograf.   -Followup per hematology services      LOS (Days) 3 A FACE TO FACE EVALUATION WAS PERFORMED  Marletta Lor 06/23/2013 7:51 AM

## 2013-06-23 NOTE — IPOC Note (Signed)
Overall Plan of Care [facility] Patient Details Name: [name] MRN: [id number] DOB: 11/15/1960  Admitting Diagnosis: Deconditioned  Hospital Problems: Active Problems:   Physical deconditioning     Functional Problem List: Nursing Edema;Pain;Endurance;Nutrition;Safety;Medication Management;Motor  PT Balance;Endurance;Pain;Safety;Other (comment);Edema (strength)  OT Balance;Endurance;Motor;Safety  SLP    TR         Basic ADL's: OT Grooming;Bathing;Dressing;Toileting     Advanced  ADL's: OT Simple Meal Preparation;Laundry;Light Housekeeping     Transfers: PT Bed Mobility;Bed to Chair;Car;Furniture  OT Toilet;Tub/Shower     Locomotion: PT Stairs;Ambulation;Wheelchair Mobility     Additional Impairments: OT Other (comment) (increased UB strength )  SLP        TR      Anticipated Outcomes Item Anticipated Outcome  Self Feeding independent  Swallowing      Basic self-care  mod I  Toileting  mod i   Bathroom Transfers mod I  Bowel/Bladder  remain continent of bowel and bladder, regular bowel movenment   Transfers  Supervision-Mod(I)  Locomotion  Supervision-Mod(I)  Communication     Cognition     Pain  Pain managed at or below a 4 with Mod I assistance  Safety/Judgment  remain free from falls with min assistance   Therapy Plan: PT Frequency: 5 out of 7 days PT Duration Estimated Length of Stay: 7-9days OT Intensity: Minimum of 1-2 x/day, 45 to 90 minutes OT Frequency: 5 out of 7 days OT Duration/Estimated Length of Stay: 7-9 days         Team Interventions: Nursing Interventions Patient/Family Education;Pain Management;Medication Management;Disease Management/Prevention;Discharge Planning;Psychosocial Support  PT interventions Ambulation/gait training;Community reintegration;DME/adaptive equipment instruction;Stair training;UE/LE Strength taining/ROM;Wheelchair propulsion/positioning;UE/LE Coordination activities;Therapeutic Activities;Pain  management;Discharge planning;Balance/vestibular training;Cognitive remediation/compensation;Disease management/prevention;Functional mobility training;Patient/family education;Therapeutic Exercise;Psychosocial support;Neuromuscular re-education  OT Interventions Balance/vestibular training;Community reintegration;Discharge planning;Functional mobility training;DME/adaptive equipment instruction;Patient/family education;Self Care/advanced ADL retraining;Therapeutic Activities;Therapeutic Exercise;UE/LE Strength taining/ROM  SLP Interventions    TR Interventions    SW/CM Interventions      Team Discharge Planning: Destination: PT-Home ,OT- Home , SLP-  Projected Follow-up: PT-Outpatient PT, OT-  Home health OT, SLP-  Projected Equipment Needs: PT-To be determined, OT- Tub/shower seat, SLP-  Equipment Details: PT- , OT-  Patient/family involved in discharge planning: PT- Patient,  OT-Patient, SLP-   MD ELOS: 7-9 days Medical Rehab Prognosis:  Excellent Assessment: The patient has been admitted for CIR therapies. The team will be addressing functional mobility, strength, stamina, balance, safety, adaptive techniques and equipment, self-care, bowel and bladder mgt, patient and caregiver education, CV stamina, pacing, nutrition HEP and considerations. Goals have been set at supervision to mod I with mobility and self-care.    Meredith Staggers, MD, FAAPMR      See Team Conference Notes for weekly updates to the plan of care

## 2013-06-23 NOTE — Progress Notes (Signed)
Occupational Therapy Session Note  Patient Details  Name: [name] MRN: [id number] Date of Birth: [birth year]  Today's Date: 06/23/2013 Time: 1345-1430 Time Calculation (min): 45 min   Skilled Therapeutic Interventions/Progress Updates:    1:1 Focus on meal preparation, energy conservation and functional mobility in the kitchen. Discussed use of a stool in the kitchen, use of a walker v a rolling cart, accessing daily items in the kitchen and accessibility to them, using lighter weight items (plates, baking dishes and cups), use of walker bag to transport items, use of a reacher to assist some items out of reach, sit to stands from different surfaces, and accessing the oven to put in or retreive items etc. Pt would like to be as independent as possible in the kitchen without the use of a w/c.   Therapy Documentation Precautions:  Precautions Precautions: Fall Restrictions Weight Bearing Restrictions: No Pain: Pain Assessment Pain Assessment: 0-10 Pain Score: 5  Pain Type: Acute pain Pain Location: Shoulder Pain Orientation: Right;Left Pain Descriptors / Indicators: Sore Pain Frequency: Intermittent Pain Onset: With Activity Patients Stated Pain Goal: 2 Pain Intervention(s): Medication (See eMAR) Applied heating pad  See FIM for current functional status  Therapy/Group: Individual Therapy  Merrilee Seashore 06/23/2013, 2:39 PM

## 2013-06-23 NOTE — Progress Notes (Signed)
Coumadin per pharmacy  Anticoagulation: new R dvt- on Coumadin. INR 2.8<<2.28. Completed 5 day overlap with heparin on 4/24. Pt is eating better. No bleeding noted.    INR goal 2-3  Plan -Warfarin 2 mg tonight - daily PT/INR  Albertina Parr, PharmD.  Clinical Pharmacist Pager [phone]

## 2013-06-23 NOTE — Progress Notes (Signed)
Physical Therapy Session Note  Patient Details  Name: [name] MRN: [id number] Date of Birth: Jan 14, 1961  Today's Date: 06/23/2013 Time: 1000-1100 and 1300-1335 Time Calculation (min): 60 min and 35 min  Short Term Goals: Week 1:  PT Short Term Goal 1 (Week 1): STGs=LTGs due to anticipated LOS  Skilled Therapeutic Interventions/Progress Updates:   AM Session: Pt received sitting in w/c, agreeable to therapy with report of increased stiffness this date. W/c propulsion using B UE/LE x 50 ft. Gait training 60 ft, 100 ft, and 150 ft using RW and supervision, pt with decreased gait speed noted to increase slightly with improved fluidity of movement throughout session. Performed car transfer using RW and mod A for BLE management. Pt provided leg lifter for car transfers and bed mobility. Pt attempted to perform bed mobility on mat table with increased hip/groin/shoulder/back pain to 10/10, report of "excruciating pain" when therapist attempting to assist patient. Therapist instructed pt to perform sit > supine by crossing LEs and leaning down on elbow, pt unable to tolerate this technique due to hip pain. Pt given increased time to perform mobility with supervision only due to increase in pain with therapist intervention. Sitting EOM, pt instructed in BLE stretching using leg lifter, performed calf/hamstring stretch x 2 each LE. Pt ambulated gym > room and left sitting in w/c with needs within reach. Due to increase in pain with attempted supine positioning pt not instructed in HEP for flexibility this session.   PM Session: Pt received sitting in w/c, agreeable to therapy. Session focused on activity tolerance with ambulation. Gait training 120 ft room > ADL apt and 50 ft ADL apt > gym using RW in controlled environment and 30 ft using RW in household environment in ADL apartment, supervision throughout and verbal cues for foot clearance and heel strike on carpet. NuStep L2 x 7 min using UE/LE for  strengthening, ROM, and activity tolerance. Gait training using RW gym > room 170 ft with 2 standing rest breaks, verbal cues needed for breathing. Pt reports feeling stronger at end of session vs beginning of session. Pt left sitting in w/c, all needs within reach.    Therapy Documentation Precautions:  Precautions Precautions: Fall Restrictions Weight Bearing Restrictions: No  See FIM for current functional status  Therapy/Group: Individual Therapy  Laretta Alstrom 06/23/2013, 12:43 PM

## 2013-06-23 NOTE — Progress Notes (Signed)
Social Work  Social Work Assessment and Plan  Patient Details  Name: [name] MRN: [id number] Date of Birth: [birth year]  Today's Date: 06/23/2013  Problem List:  Patient Active Problem List   Diagnosis Date Noted  . Physical deconditioning 06/20/2013  . Kidney transplant failure and rejection 06/13/2013    Class: Chronic  . Adverse effect of immunosuppressive drug 06/13/2013    Class: Acute  . Chronic kidney disease, stage IV (severe) 06/13/2013    Class: Chronic  . Protein-calorie malnutrition, severe 06/13/2013  . Anemia 06/12/2013   Past Medical History:  Past Medical History  Diagnosis Date  . Chronic kidney disease   . Anemia   . Stroke   . Hypertension   . Hypercalcemia   . Hypomagnesemia   . Complications of transplanted kidney   . Chronic kidney disease, stage IV (severe)   . Loss of weight   . Pancytopenia   . Adverse effect of immunosuppressive drug    Past Surgical History:  Past Surgical History  Procedure Laterality Date  . Kidney transplant     Social History:  reports that she has never smoked. She has never used smokeless tobacco. She reports that she does not drink alcohol or use illicit drugs.  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 Children: pt has one son, [name], who lives in [place], [place] @ [phone] Anticipated Caregiver: [name], boyfriend Ability/Limitations of Caregiver: intermittent Caregiver Availability: Intermittent Family Dynamics: pt notes that she is very close with her son and that he is the person who had encouraged her to come to [place].  Feels her relationship with bf is very solid  and that he is willing to assist as his work allows.    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  She admits she is emotionally frustrated with chronic and cyclical decline since sept 2014.  Focused on making goals for short and long term recovery and hopeful she can "break the cycle".  Denies any s/s of depression or anxiety.  Self soothes her writing  and art. Recent Psychosocial Issues: chronic health issues for several months. Pyschiatric History: None Substance Abuse History: None  Patient / Family Perceptions, Expectations & Goals Pt/Family understanding of illness & functional limitations: pt reports that her primary medical issues originate with her renal disease.  Notes she is uncertain what the main cause is for her decline over last few months. Premorbid pt/family roles/activities: independent overall, however, limited endurance.  Unable to work.  Was trying to stay active with her writing - was working on a book about travel for persons with kidney disease. Anticipated changes in roles/activities/participation: dependent on gains made - gains may also be short-lived given chronic kidney disease and other health issues. Pt/family expectations/goals: Pt hopeful she can increase her independence  and be able to travel again with her boyfriend  [employer]: None Premorbid Home Care/DME Agencies: None Transportation available at  discharge: yes - when boyfriend not working  Discharge Planning Living Arrangements: Spouse/significant other [place]: Children;Spouse/significant other Type of Residence: Private residence Administrator, sports: Chartered certified accountant Resources: [employer] Screen Referred: No Living Expenses: Higher education careers adviser Management: Significant Other Does the patient have any problems obtaining your medications?: No Home Management: shared Patient/Family Preliminary Plans: pt plans to return home with boyfriend who can provide intermittent assistance Expected length of stay: ELOS 7 to 10 days  Clinical Impression Very unfortunate woman here with chronic health issues/ decline.  Very deconditioned over past several months. Remains optimistic overall that she can regain her strength and independence.  No s/s of depression or anxiety but will monitor.  Good support from son (out of town) and her boyfriend (of 7 yrs).  Will follow for support and d/c planning.  [name] 06/23/2013, 5:07 PM

## 2013-06-24 ENCOUNTER — Encounter (HOSPITAL_COMMUNITY): Payer: Medicare Other

## 2013-06-24 ENCOUNTER — Inpatient Hospital Stay (HOSPITAL_COMMUNITY): Payer: Medicare Other

## 2013-06-24 DIAGNOSIS — R5381 Other malaise: Secondary | ICD-10-CM

## 2013-06-24 LAB — BASIC METABOLIC PANEL
BUN: 54 mg/dL — AB (ref 6–23)
CHLORIDE: 99 meq/L (ref 96–112)
CO2: 26 mEq/L (ref 19–32)
CREATININE: 3.36 mg/dL — AB (ref 0.50–1.10)
Calcium: 8.6 mg/dL (ref 8.4–10.5)
GFR calc non Af Amer: 14 mL/min — ABNORMAL LOW (ref >90)
GFR, EST AFRICAN AMERICAN: 17 mL/min — AB (ref >90)
Glucose, Bld: 103 mg/dL — ABNORMAL HIGH (ref 70–99)
Potassium: 5.5 mEq/L — ABNORMAL HIGH (ref 3.7–5.3)
Sodium: 136 mEq/L — ABNORMAL LOW (ref 137–147)

## 2013-06-24 LAB — PROTIME-INR
INR: 2.73 — ABNORMAL HIGH (ref 0.00–1.49)
PROTHROMBIN TIME: 28 s — AB (ref 11.6–15.2)

## 2013-06-24 MED ORDER — WARFARIN SODIUM 2 MG PO TABS
2.0000 mg | ORAL_TABLET | Freq: Once | ORAL | Status: AC
  Administered 2013-06-24: 2 mg via ORAL
  Filled 2013-06-24: qty 1

## 2013-06-24 NOTE — Progress Notes (Signed)
Occupational Therapy Session Note  Patient Details  Name: [name] MRN: [id number] Date of Birth: 1960/07/26  Today's Date: 06/24/2013 Time: 0900-1000 Time Calculation (min): 60 min   Skilled Therapeutic Interventions/Progress Updates:    1:1 self care retraining at shower level with focus on increasing independence with functional mobility, sit to stand, standing balance, use of a reacher prn with LB management of clothing and drying self off. Issued pt a long handled sponge to better access bathing her feet and her bath. Pt still with slow pace due to ongoing pelvic stiffness.  Therapy Documentation Precautions:  Precautions Precautions: Fall Restrictions Weight Bearing Restrictions: No Pain:  c/o right shoulder pain - applied ice to shoulder after session   See FIM for current functional status  Therapy/Group: Individual Therapy  Merrilee Seashore 06/24/2013, 1:41 PM

## 2013-06-24 NOTE — Progress Notes (Signed)
Physical Therapy Session Note  Patient Details  Name: [name] MRN: [id number] Date of Birth: 02/03/1961  Today's Date: 06/24/2013 Time: Treatment Session 1: 0800-0900; Treatment Session 2: 1030-1100 Time Calculation (min): Treatment Session 1: 60 min; Treatment Session 2: 11min  Short Term Goals: Week 1:  PT Short Term Goal 1 (Week 1): STGs=LTGs due to anticipated LOS  Skilled Therapeutic Interventions/Progress Updates:  Treatment Session 1:  1:1. Pt received sitting EOB, ready for therapy. Discussion with pt at start of session regarding her concerns and physical therapy goals. Focus majority of session on ambulation, bed mobility and B LE stretching. Pt req close(S) for amb x150' w/ RW. Pt demonstrates very slow, non-fluid antalgic pattern due to pelvic pain. Emphasis on trialing different methods for t/f sup<>sit in bed for improved comfort/ease including: log roll, use of leg lifter and scooting back diagonally for improved ability to bring B LE into bed. Pt req light mod A w/ increased comfort utilizing scooting technique. Pt educated on and assisted with B LE stretching to address tightness in: gastrocs, hamstrings (knee flex), internal/external rotation, glute and quad (leg hanging off side of bed). Pt sitting in w/c at end of session w/ all needs in reach, bed alarm on.   Treatment Session 2:  1:1. Pt received sitting in w/c, ready for therapy. Focus this session on gait training w/ use of rollator. Pt educated on benefits of rollator as well as overall safety. Pt req consistent mod cueing for management of breaks throughout session. Trial amb w/ and w/out brakes on due to dependent at times on B UE from pelvic pain. Pt able to amb 150'x2 on hard level and carpeted surfaces w/ close(S)-min A. Pt sitting in w/c at end of session w/ all needs in reach.   Therapy Documentation Precautions:  Precautions Precautions: Fall Restrictions Weight Bearing Restrictions: No  See FIM for  current functional status  Therapy/Group: Individual Therapy  Gilmore Laroche 06/24/2013, 12:16 PM

## 2013-06-24 NOTE — Progress Notes (Signed)
[facility] PHYSICAL MEDICINE & REHABILITATION     PROGRESS NOTE    Subjective/Complaints: Complaining of right shoulder pain this weekend.  A 12 point review of systems has been performed and if not noted above is otherwise negative.    Objective: Vital Signs: Blood pressure 93/64, pulse 104, temperature 98.3 F (36.8 C), temperature source Oral, resp. rate 16, height 5\' 3"  (1.6 m), weight 46.1 kg (101 lb 10.1 oz), SpO2 98.00%. No results found. No results found for this basename: WBC, HGB, HCT, PLT,  in the last 72 hours  Recent Labs  06/23/13 0545  NA 137  K 6.3*  CL 104  GLUCOSE 79  BUN 53*  CREATININE 3.47*  CALCIUM 8.0*   CBG (last 3)  No results found for this basename: GLUCAP,  in the last 72 hours  Wt Readings from Last 3 Encounters:  06/20/13 46.1 kg (101 lb 10.1 oz)  06/18/13 44.271 kg (97 lb 9.6 oz)  06/08/11 58.968 kg (130 lb)    Physical Exam:  Constitutional: She is oriented to person, place, and time. Frail appearing HENT: oral mucosa pink and moist Head: Normocephalic.  Eyes: EOM are normal.  Neck: Normal range of motion. Neck supple. No tracheal deviation present. No thyromegaly present.  Cardiovascular: Normal rate and regular rhythm.  Respiratory: Effort normal and breath sounds normal. No respiratory distress.  GI: Soft. Bowel sounds are normal. She exhibits no distension.  Neurological: She is alert and oriented to person, place, and time. Normal insight and awareness. No cranial nerve deficit. She exhibits normal muscle tone. Coordination normal.  UE 4- deltoid, 4 bicep and tricep, 4+HI, HF 3+, KE 4, ankles 4+. No sensory findings  Musc: right shoulder impingement testing + Skin: Skin is warm and dry.  Psychiatric: She has a normal mood and affect    Assessment/Plan: 1. Functional deficits secondary to deconditioning related to kidney transplant failure and rejection which require 3+ hours per day of interdisciplinary therapy in a  comprehensive inpatient rehab setting. Physiatrist is providing close team supervision and 24 hour management of active medical problems listed below. Physiatrist and rehab team continue to assess barriers to discharge/monitor patient progress toward functional and medical goals. FIM: FIM - Bathing Bathing Steps Patient Completed: Chest;Right Arm;Left Arm;Abdomen;Front perineal area;Buttocks;Right upper leg;Left upper leg Bathing: 4: Min-Patient completes 8-9 73f 10 parts or 75+ percent  FIM - Upper Body Dressing/Undressing Upper body dressing/undressing steps patient completed: Thread/unthread right sleeve of pullover shirt/dresss;Thread/unthread left sleeve of pullover shirt/dress;Put head through opening of pull over shirt/dress;Pull shirt over trunk Upper body dressing/undressing: 4: Min-Patient completed 75 plus % of tasks FIM - Lower Body Dressing/Undressing Lower body dressing/undressing steps patient completed: Pull underwear up/down;Fasten/unfasten pants (due to time and pain) Lower body dressing/undressing: 2: Max-Patient completed 25-49% of tasks  FIM - Toileting Toileting steps completed by patient: Adjust clothing prior to toileting;Performs perineal hygiene;Adjust clothing after toileting Toileting Assistive Devices: Grab bar or rail for support Toileting: 5: Supervision: Safety issues/verbal cues  FIM - Radio producer Devices: Elevated toilet seat;Walker Toilet Transfers: 5-To toilet/BSC: Supervision (verbal cues/safety issues);5-From toilet/BSC: Supervision (verbal cues/safety issues)  FIM - Engineer, site Assistive Devices: Arm rests;Walker Bed/Chair Transfer: 4: Bed > Chair or W/C: Min A (steadying Pt. > 75%);4: Chair or W/C > Bed: Min A (steadying Pt. > 75%);3: Sit > Supine: Mod A (lifting assist/Pt. 50-74%/lift 2 legs);4: Supine > Sit: Min A (steadying Pt. > 75%/lift 1 leg)  FIM - Locomotion: Wheelchair  Distance:  50 Locomotion: Wheelchair: 2: Travels 49 - 149 ft with supervision, cueing or coaxing FIM - Locomotion: Ambulation Locomotion: Ambulation Assistive Devices: Administrator Ambulation/Gait Assistance: 5: Supervision Locomotion: Ambulation: 5: Travels 150 ft or more with supervision/safety issues  Comprehension Comprehension Mode: Auditory Comprehension: 7-Follows complex conversation/direction: With no assist  Expression Expression Mode: Verbal Expression: 7-Expresses complex ideas: With no assist  Social Interaction Social Interaction: 7-Interacts appropriately with others - No medications needed.  Problem Solving Problem Solving: 7-Solves complex problems: Recognizes & self-corrects  Memory Memory: 7-Complete Independence: No helper  Medical Problem List and Plan:  1. Deconditioning related to kidney disease and multiple medical issues  2. DVT Prophylaxis/Anticoagulation: Acute DVT of right saphenofemoral junction extending into the common femoral vein. Superficial thrombus right greater saphenous vein. Heparin to Coumadin therapy  3. Pain Management: Hydrocodone as needed. Monitor with increased mobility  4. Neuropsych: This patient is capable of making decisions on her own behalf.  5. Chronic kidney disease with a history of kidney transplant 9 years ago. Followup renal services. Continue CellCept 250 mg twice a day and Prograf 1 mg twice a day. Latest creatinine 3.44  6. Chronic anemia/pancytopenia. Recheck tomorrow  -Transfuse if symptomatic.  - Aranesp weekly.   -Monitor platelet counts currently 91,000 related to her CellCept and Prograf.   -Followup per hematology services  7. Right rotator cuff tendonitis---ice, scapular/RTC exercises with PT/OT    LOS (Days) 4 A FACE TO FACE EVALUATION WAS PERFORMED  Meredith Staggers 06/24/2013 7:42 AM

## 2013-06-24 NOTE — Progress Notes (Signed)
Agree with interventions and documentation of dietetic intern.  Shali Vesey, MS RD LDN Clinical Inpatient Dietitian Pager: [phone] Weekend/After hours pager: [phone]  

## 2013-06-24 NOTE — Progress Notes (Signed)
Occupational Therapy Note  Patient Details  Name: [name] MRN: [id number] Date of Birth: [birth year] Today's Date: 06/24/2013  Time: 1330-1415 Pt c/o 7/10 pain in bilateral hips, right abdomen/rib cage, and right shoulder; RN aware and heat applied to hips Individual Therapy  Pt engaged in kitchen activities with focus on problem solving, functional mobility with Rollator. Pt practiced retrieving items from refrigerator and cabinets.  Pt amb with Rollator into bed room and practiced bed mobility.  Pt required assistance with lifting BLE into bed.  Pt states her mattresses at home currently rest on floor but states she is purchasing at bed frame for mattresses.  Focus on activity tolerance, safety awareness, discharge planning, bed mobility, and functional amb with Rollator for home mgmt tasks.   Leroy Libman 06/24/2013, 3:21 PM

## 2013-06-24 NOTE — Progress Notes (Signed)
Coumadin per pharmacy  Anticoagulation: new R dvt- on coumadin. INR 2.73 << 2.8<<2.28. Completed 5 day overlap with heparin on 4/24   INR goal 2-3  Plan -Warfarin 2 mg tonight - daily PT/INR - coumadin educated

## 2013-06-25 ENCOUNTER — Inpatient Hospital Stay (HOSPITAL_COMMUNITY): Payer: Medicare Other

## 2013-06-25 ENCOUNTER — Encounter (HOSPITAL_COMMUNITY): Payer: Medicare Other

## 2013-06-25 DIAGNOSIS — R5381 Other malaise: Secondary | ICD-10-CM

## 2013-06-25 LAB — CBC
HEMATOCRIT: 27 % — AB (ref 36.0–46.0)
Hemoglobin: 8.2 g/dL — ABNORMAL LOW (ref 12.0–15.0)
MCH: 27.1 pg (ref 26.0–34.0)
MCHC: 30.4 g/dL (ref 30.0–36.0)
MCV: 89.1 fL (ref 78.0–100.0)
PLATELETS: 278 10*3/uL (ref 150–400)
RBC: 3.03 MIL/uL — AB (ref 3.87–5.11)
RDW: 20.5 % — ABNORMAL HIGH (ref 11.5–15.5)
WBC: 4.3 10*3/uL (ref 4.0–10.5)

## 2013-06-25 LAB — PROTIME-INR
INR: 2.98 — ABNORMAL HIGH (ref 0.00–1.49)
Prothrombin Time: 29.9 seconds — ABNORMAL HIGH (ref 11.6–15.2)

## 2013-06-25 MED ORDER — WARFARIN SODIUM 2 MG PO TABS
2.0000 mg | ORAL_TABLET | Freq: Once | ORAL | Status: AC
  Administered 2013-06-25: 2 mg via ORAL
  Filled 2013-06-25: qty 1

## 2013-06-25 NOTE — Progress Notes (Signed)
Occupational Therapy Note  Patient Details  Name: [name] MRN: [id number] Date of Birth: 1960/04/25 Today's Date: 06/25/2013  Time: 1330-1415 Pt c/o pain in right hip unrated; RN aware and repositioned Individual Therapy  Pt engaged in functional amb with RW in kitchen for simple kitchen tasks.  Pt exhibited no unsafe behaviors during activities. Pt became tearful during session when discussing discharge planning and activity levels. Emotional support provided.  Focus on activity tolerance, dynamic standing balance, functional amb with RW, and safety awareness.   Leroy Libman 06/25/2013, 2:52 PM

## 2013-06-25 NOTE — Progress Notes (Signed)
NUTRITION FOLLOW UP  Intervention:   Continue Boost Plus supplement po TID, each supplement providing 360 kcals and 14 grams of protein  Continue to provide snacks throughout the day  Continue to substitute soy milk for regular milk   Nutrition Dx:   Inadequate oral intake, ongoing  Monitor:   1.  Food/Beverage; pt meeting >/=90% estimated needs with tolerance. 2.  Wt/wt change; monitor trends  Assessment:   Pt admitted to rehab with weakness and diarrhea.  Pt known to this RD from previous assessments and work with pt during acute admission.  Pt with current interventions in place to support poor PO- supplements, soy milk, snacks, encouragement. Pt unavailable at time of visit; working with rehab team.  Per chart, PO intake continues to vary, but has overall improved to 75-100% of small meals.  Pt with several supplements and snacks at bedside.  RD to follow.   Height: Ht Readings from Last 1 Encounters:  06/20/13 5\' 3"  (1.6 m)    Weight Status:   Wt Readings from Last 1 Encounters:  06/20/13 101 lb 10.1 oz (46.1 kg)    Re-estimated needs:  Kcal: 1400-1600 Protein: 67-80g Fluid: >1.5 L/day  Skin: non-pitting edema  Diet Order: General   Intake/Output Summary (Last 24 hours) at 06/25/13 1404 Last data filed at 06/25/13 0800  Gross per 24 hour  Intake    236 ml  Output      0 ml  Net    236 ml    Last BM: 4/26   Labs:   Recent Labs Lab 06/19/13 0610 06/20/13 0620 06/21/13 0610 06/23/13 0545 06/24/13 0740  NA 140 140 138 137 136*  K 4.8 5.3 5.6* 6.3* 5.5*  CL 110 111 108 104 99  CO2 16* 18* 20 22 26   BUN 53* 52* 51* 53* 54*  CREATININE 3.52* 3.44* 3.38* 3.47* 3.36*  CALCIUM 7.7* 7.6* 8.0* 8.0* 8.6  MG 1.7 1.6  --   --   --   GLUCOSE 78 81 83 79 103*    CBG (last 3)  No results found for this basename: GLUCAP,  in the last 72 hours  Scheduled Meds: . calcitRIOL  1 mcg Oral Daily  . calcium carbonate  2 tablet Oral TID  . cinacalcet  30 mg  Oral Q breakfast  . darbepoetin (ARANESP) injection - NON-DIALYSIS  200 mcg Subcutaneous Q Sat-1800  . lactose free nutrition  237 mL Oral TID WC  . magnesium oxide  400 mg Oral TID  . multivitamin with minerals  1 tablet Oral Daily  . mycophenolate  250 mg Oral BID  . sodium bicarbonate  1,300 mg Oral TID  . tacrolimus  1 mg Oral BID  . warfarin  2 mg Oral ONCE-1800  . Warfarin - Pharmacist Dosing Inpatient   Does not apply q1800    Continuous Infusions:   Brynda Greathouse, MS RD LDN Clinical Inpatient Dietitian Pager: [phone] Weekend/After hours pager: [phone]

## 2013-06-25 NOTE — Progress Notes (Deleted)
PMR Admission Coordinator Pre-Admission Assessment  Patient: [name] is an 53 y.o., female MRN: [id number] DOB: November 07, 1960 Height: _0  (160 cm) Weight: 46.1 kg (101 lb 10.1 oz)              Insurance Information HMO:     PPO: yes     PCP:      IPA:      80/20:      OTHER: medicare advantage plan PRIMARY: [employer]      Policy#: [id number]      Subscriber: pt CM Name: Sunday Shams      Phone#: [phone]     Fax#: [phone] Pre-Cert#: [id number]      Employer: disabled Benefits:  Phone #: [phone]     Name: 4/22 Eff. Date: 02/28/10     Deduct: none      Out of Pocket Max: $3850      Life Max: none CIR: $265 per day, days 1 - 6      SNF: no copay days 1-20; $140 per day days 21-100 Outpatient: $40 per visit     Co-Pay: no visit limit Home Health: 100%      Co-Pay: no visit limit DME: 80%     Co-Pay: 20% Providers: in network  SECONDARY: none        Emergency Contact Information Contact Information   Name Relation Home Work Mobile   Stratton Son [phone]     Brayton Mars Significant other [phone]       Current Medical History  Patient Admitting [facility] related to kidney disease and multiple medical issues above  History of Present Illness: [name] is a 53 y.o. 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 Patient has lived in [place] since 2012 and was independent up until September when generalized decline living with her boyfriend. Presented 06/12/2013 with generalized weakness, pancytopenia and recent weight loss. Noted decrease in hemoglobin from 10 on 05/12/2013 to 7.6-5.9. Chest x-ray was negative. Hematology service was consulted 06/14/2013 with workup ongoing question plan for bone marrow biopsy. Renal services follow up latest creatinine 3.47 question plan to resume RRT.   Palliative care met  with pt for goals of care 06/19/13. Advanced directive packet given to her. Discussed surrogate decision makers.  Hematology consulted 06/19/13 Pancytopenia: in the setting of chronic disease, including ESRD status post cadaveric kidney transplant in 2005. Patient is stage IV, and is being followed by the renal service. Her ANC is 4,300.  Her anemia is very significant, acute on chronic. No recent ESA therapy has been given. On admission, the patient was on CellCept and Prograft, which she continues to receive was hospitalized. She is very symptomatic for anemia, for which she may have to receive more transfusions. Iron studies are consistent with chronic disease.Likely anemia of chronic kidney disease. Check EPO level and replete per nephrology.  Her platelets are low, but they have been low for a long time, in the setting of CellCept and Prograft .  Supportive transfusion may be recommended if no underlying risk, if platelets less than 10,000 or acute bleed, Neupogen until ANC>1 and RBC transfusion if Hb <7 or acutely bleeding.    Past Medical History   Past Medical History  Diagnosis Date  . Chronic kidney disease   . Anemia   . Stroke   . Hypertension   . Hypercalcemia   . Hypomagnesemia   . Complications of  transplanted kidney   . Chronic kidney disease, stage IV (severe)   . Loss of weight   . Pancytopenia   . Adverse effect of immunosuppressive drug     Family History  family history is not on file.  Prior Rehab/Hospitalizations: home health only  Current Medications  Current facility-administered medications:acetaminophen (TYLENOL) tablet 650 mg, 650 mg, Oral, Q8H PRN, Meredith Staggers, MD, 650 mg at 06/25/13 1025;  calcitRIOL (ROCALTROL) capsule 1 mcg, 1 mcg, Oral, Daily, Lavon Paganini Angiulli, PA-C, 1 mcg at 06/25/13 8527;  calcium carbonate (TUMS - dosed in mg elemental calcium) chewable tablet 400 mg of elemental calcium, 2 tablet, Oral, TID, Lavon Paganini Angiulli, PA-C, 400 mg of  elemental calcium at 06/25/13 1305 cinacalcet (SENSIPAR) tablet 30 mg, 30 mg, Oral, Q breakfast, Lavon Paganini Angiulli, PA-C, 30 mg at 06/25/13 7824;  darbepoetin (ARANESP) injection 200 mcg, 200 mcg, Subcutaneous, Q Sat-1800, Lavon Paganini Angiulli, PA-C, 200 mcg at 06/22/13 1714;  HYDROcodone-acetaminophen (NORCO/VICODIN) 5-325 MG per tablet 1-2 tablet, 1-2 tablet, Oral, Q4H PRN, Lavon Paganini Angiulli, PA-C, 1 tablet at 06/24/13 1629 lactose free nutrition (BOOST PLUS) liquid 237 mL, 237 mL, Oral, TID WC, Daniel J Angiulli, PA-C, 237 mL at 06/25/13 1200;  magnesium oxide (MAG-OX) tablet 400 mg, 400 mg, Oral, TID, Lavon Paganini Angiulli, PA-C, 400 mg at 06/25/13 1305;  multivitamin with minerals tablet 1 tablet, 1 tablet, Oral, Daily, Lavon Paganini Angiulli, PA-C, 1 tablet at 06/25/13 2353 mycophenolate (CELLCEPT) capsule 250 mg, 250 mg, Oral, BID, Lavon Paganini Angiulli, PA-C, 250 mg at 06/25/13 0818;  ondansetron (ZOFRAN) injection 4 mg, 4 mg, Intravenous, Q6H PRN, Lavon Paganini Angiulli, PA-C;  ondansetron (ZOFRAN) tablet 4 mg, 4 mg, Oral, Q6H PRN, Lavon Paganini Angiulli, PA-C;  sodium bicarbonate tablet 1,300 mg, 1,300 mg, Oral, TID, Lavon Paganini Angiulli, PA-C, 1,300 mg at 06/25/13 1305 sorbitol 70 % solution 30 mL, 30 mL, Oral, Daily PRN, Lavon Paganini Angiulli, PA-C;  tacrolimus (PROGRAF) capsule 1 mg, 1 mg, Oral, BID, Lavon Paganini Angiulli, PA-C, 1 mg at 06/25/13 6144;  warfarin (COUMADIN) tablet 2 mg, 2 mg, Oral, ONCE-1800, Arman Bogus, [facility];  Warfarin - Pharmacist Dosing Inpatient, , Does not apply, q1800, Meredith Staggers, MD  Patients Current Diet: General weight loss 40 lbs over past several months. Has a lot of dietary needs.  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, hospital  Current Functional Level Cognition  Overall Cognitive Status: Within Functional Limits for tasks assessed Orientation Level: Oriented X4 Attention: Alternating;Selective Selective Attention: Appears intact Alternating Attention: Appears intact Memory: Appears intact Awareness: Appears intact Problem Solving: Appears intact Safety/Judgment: Appears intact    Extremity Assessment (includes Sensation/Coordination)          ADLs       Mobility       Transfers       Ambulation / Gait / Stairs / Wheelchair Mobility  Ambulation/Gait Ambulation Distance (Feet): 175 Feet Gait velocity: decreased Wheelchair Mobility Distance: 50    Posture / Balance Static Sitting Balance Static Sitting - Balance Support: No upper extremity supported;Feet supported Static Sitting - Level of Assistance: 7: Independent Dynamic Sitting Balance Dynamic Sitting - Balance Support: Right upper extremity supported;Left upper extremity supported;Feet supported Dynamic Sitting - Level of Assistance: 5: Stand by  assistance Dynamic Sitting - Balance Activities: Lateral lean/weight shifting;Forward lean/weight shifting;Reaching for objects Static Standing Balance Static Standing - Balance Support: Bilateral upper extremity supported Static Standing - Level of Assistance: 5: Stand by assistance Dynamic Standing Balance Dynamic Standing - Balance Support: During functional activity;Left upper extremity supported;Bilateral upper extremity supported;Right upper extremity supported Dynamic Standing - Level of Assistance: 5: Stand by assistance;4: Min assist Dynamic Standing - Balance Activities: Lateral lean/weight shifting;Forward lean/weight shifting;Reaching for objects    Special needs/care consideration Continuous Drip IV Heparin IV bridge to coumadin Bowel mgmt: continent Bladder mgmt: continent   Previous Home Environment Living Arrangements: Spouse/significant  other  Lives With: Significant other Available Help at Discharge: Family;Available PRN/intermittently Type of Home: House Home Layout: One level Home Access: Stairs to enter [employer] of Steps: 1 stoop step Bathroom Shower/Tub: Walk-in shower;Door [employer] Toilet: Standard Bathroom Accessibility: Yes How Accessible: Accessible via walker Home Care Services: No Type of Home Care Services: Home PT;Home OT;[place] (if known): [facility]  Discharge Living Setting Does the patient have any problems obtaining your medications?: No  Social/Family/Support Systems Patient Roles: Partner;Other (Comment) (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 See "History of Present Illness" (above) for medical update. Functional changes are: overall min assist. Patient's medical and functional status update has been discussed with the Rehabilitation physician and patient remains appropriate for inpatient rehabilitation. Will admit to inpatient rehab today.  Preadmission Screen Completed By:  Cleatrice Burke, 06/25/2013 3:09 PM ______________________________________________________________________   Discussed status with Dr. Letta Pate on 06/20/13 at  80 and received telephone approval for admission today.  Admission Coordinator:  Cleatrice Burke, time [id number] Date  06/20/13.

## 2013-06-25 NOTE — Progress Notes (Signed)
Occupational Therapy Session Note  Patient Details  Name: [name] MRN: [id number] Date of Birth: [birth year]  Today's Date: 06/25/2013 Time: 1000-1100 Time Calculation (min): 60 min  Short Term Goals: Week 1:     Skilled Therapeutic Interventions/Progress Updates:    Pt seen for ADL retraining with focus on activity tolerance, sit<>stand, dynamic standing balance, and functional mobility. Pt received sitting in w/c. Ambulated from room>bathroom with min guard-supervision and more then reasonable amount of time. Cues provided for scooting to edge of surface for sit>stand from TTB and toilet. Pt utilized LH sponge to increase independence with LB bathing. Practiced using crossover technique for LB dressing (per pt request), requiring min assist for positioning after bathing then pt able to complete via pulling on pants for donning socks. Discussed benefits of using reacher for LB dressing. At end of session pt left sitting in w/c with all needs in reach.   Therapy Documentation Precautions:  Precautions Precautions: Fall Restrictions Weight Bearing Restrictions: No General:   Vital Signs:   Pain: Pain Assessment Pain Assessment: No/denies pain  See FIM for current functional status  Therapy/Group: Individual Therapy  [name] 06/25/2013, 11:20 AM

## 2013-06-25 NOTE — Progress Notes (Signed)
Physical Therapy Session Note  Patient Details  Name: [name] MRN: [id number] Date of Birth: 1960/08/26  Today's Date: 06/25/2013 Time: Treatment Session 1: 0800-0900; Treatment Session 2: [id number] Time Calculation (min): Treatment Session 1: 60 min; Treatment Session 2: 79min  Short Term Goals: Week 1:  PT Short Term Goal 1 (Week 1): STGs=LTGs due to anticipated LOS  Skilled Therapeutic Interventions/Progress Updates:  Treatment Session 1:  1:1. Pt received using bathroom w/ nurse tech in room, therapist taking over. Pt req overall (S) for toileting. Focus this session on ambulation, stair negotiation and HEP. Pt req overall supervision for ambulation w/ use of rollator (brakes locked) 175'x1. Pt remained unsure as to whether she preferred  use of RW vs. Rollator, reviewed pro/cons of each. Pt practiced single step negotiation w/ use of rollator and RW, req min A and close (S) respectively. Pt ultimately stated preference for use of RW due to improved ability to manage RW during stair negotiation as well as stability during ambulation. Pt again emphasizing her goals for therapy including ambulation w/out AD and improved strength/ROM. Pt educated regarding realistic STGs and LTGs as well as target goals for rehab vs. OP PT. Pt verbalized understanding. Extensive HEP provided to pt regarding B LE stretching and strengthening exercises. Will continue to provide further education for complete understanding. Pt sitting in w/c at end of session w/ all needs in reach.   Treatment Session 2:  1:1. Pt received sitting in recliner, ready for therapy. Focus this session on bed mobility and B LE ROM. Pt req min A for t/f sit>sup EOB w/ use of leg lifer as well as HOB elevated + bed rails for t/f sup>sit, req mod cueing for technique overall. Pt req significantly increased time for completion due to pelvic pain. Extensively reviewed pt's HEP for B LE stretching/ROM w/ education regarding AROM w/ use of leg  lifter as well as PROM. Pt verbalized understanding. Also re-emphasized realistic STGs vs. LTGs as well as benefits of OP PT. Pt sitting in recliner at end of session w/ all needs in reach.   Therapy Documentation Precautions:  Precautions Precautions: Fall Restrictions Weight Bearing Restrictions: No  See FIM for current functional status  Therapy/Group: Individual Therapy  Gilmore Laroche 06/25/2013, 12:18 PM

## 2013-06-25 NOTE — Progress Notes (Signed)
Orthopedic Tech Progress Note Patient Details:  [name] 10/08/1960 [id number]  Ortho Devices Type of Ortho Device: Abdominal binder Ortho Device/Splint Interventions: Application   Theodoro Parma Cammer 06/25/2013, 11:01 AM

## 2013-06-25 NOTE — Progress Notes (Signed)
Coumadin per pharmacy  Anticoagulation: new R DVT- on coumadin. Acute DVT of right saphenofemoral junction extending into the common femoral vein. Superficial thrombus right greater saphenous vein.  INR 2.98<<2.73 << 2.8<<2.28. Completed 5 day overlap with heparin on 06/21/13.  Hgb 8.2, pltc 278K, no bleeding noted. Deconditioning related to kidney disease and multiple medical issues. H/o chronic kidney disease with a history of kidney transplant 9 years ago and chronic anemia/pancytopenia. hgb 8.2 (up).  Ate 95% breakfast today compared to only 10% lunch and 50% breakfast eaten yesterday.   INR goal 2-3  INR remains within goal on ~2 mg daily over the last 5 days. (2.5mg  x 1 given on 4/25).   Plan: -Warfarin 2 mg tonight - daily PT/INR - coumadin educated

## 2013-06-25 NOTE — Progress Notes (Signed)
B/P at 2014: 80/52. Patient asymptomatic. Marlowe Shores, PA. made aware. Mystery Schrupp A. Kees Idrovo, RN.

## 2013-06-25 NOTE — Progress Notes (Signed)
[facility] PHYSICAL MEDICINE & REHABILITATION     PROGRESS NOTE    Subjective/Complaints: Right shoulder still sore. 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  Some pain when breathing. A 12 point review of systems has been performed and if not noted above is otherwise negative.    Objective: Vital Signs: Blood pressure 100/70, pulse 89, temperature 98.2 F (36.8 C), temperature source Oral, resp. rate 16, height 5\' 3"  (1.6 m), weight 46.1 kg (101 lb 10.1 oz), SpO2 97.00%. No results found.  Recent Labs  06/25/13 0643  WBC 4.3  HGB 8.2*  HCT 27.0*  PLT 278    Recent Labs  06/23/13 0545 06/24/13 0740  NA 137 136*  K 6.3* 5.5*  CL 104 99  GLUCOSE 79 103*  BUN 53* 54*  CREATININE 3.47* 3.36*  CALCIUM 8.0* 8.6   CBG (last 3)  No results found for this basename: GLUCAP,  in the last 72 hours  Wt Readings from Last 3 Encounters:  06/20/13 46.1 kg (101 lb 10.1 oz)  06/18/13 44.271 kg (97 lb 9.6 oz)  06/08/11 58.968 kg (130 lb)    Physical Exam:  Constitutional: She is oriented to person, place, and time. Frail appearing HENT: oral mucosa pink and moist Head: Normocephalic.  Eyes: EOM are normal.  Neck: Normal range of motion. Neck supple. No tracheal deviation present. No thyromegaly present.  Cardiovascular: Normal rate and regular rhythm.  Respiratory: Effort normal and breath sounds normal. No respiratory distress.  GI: Soft. Bowel sounds are normal. She exhibits no distension.  Neurological: She is alert and oriented to person, place, and time. Normal insight and awareness. No cranial nerve deficit. She exhibits normal muscle tone. Coordination normal.  UE 4- deltoid, 4 bicep and tricep, 4+HI, HF 3+, KE 4, ankles 4+. No sensory findings  Musc: right shoulder impingement testing + Skin: Skin is warm and dry.  Psychiatric: She has a normal mood and affect    Assessment/Plan: 1. Functional deficits secondary to deconditioning related to  kidney transplant failure and rejection which require 3+ hours per day of interdisciplinary therapy in a comprehensive inpatient rehab setting. Physiatrist is providing close team supervision and 24 hour management of active medical problems listed below. Physiatrist and rehab team continue to assess barriers to discharge/monitor patient progress toward functional and medical goals.   FIM: FIM - Bathing Bathing Steps Patient Completed: Chest;Right Arm;Left Arm;Abdomen;Front perineal area;Buttocks;Right upper leg;Left upper leg Bathing: 4: Min-Patient completes 8-9 25f 10 parts or 75+ percent  FIM - Upper Body Dressing/Undressing Upper body dressing/undressing steps patient completed: Thread/unthread right sleeve of pullover shirt/dresss;Thread/unthread left sleeve of pullover shirt/dress;Put head through opening of pull over shirt/dress;Pull shirt over trunk Upper body dressing/undressing: 4: Min-Patient completed 75 plus % of tasks FIM - Lower Body Dressing/Undressing Lower body dressing/undressing steps patient completed: Pull underwear up/down;Fasten/unfasten pants (due to time and pain) Lower body dressing/undressing: 2: Max-Patient completed 25-49% of tasks  FIM - Toileting Toileting steps completed by patient: Adjust clothing prior to toileting;Performs perineal hygiene;Adjust clothing after toileting Toileting Assistive Devices: Grab bar or rail for support Toileting: 5: Supervision: Safety issues/verbal cues  FIM - Radio producer Devices: Elevated toilet seat;Walker Toilet Transfers: 5-To toilet/BSC: Supervision (verbal cues/safety issues);5-From toilet/BSC: Supervision (verbal cues/safety issues)  FIM - Engineer, site Assistive Devices: Arm rests;Walker Bed/Chair Transfer: 4: Bed > Chair or W/C: Min A (steadying Pt. > 75%);4: Chair or W/C > Bed: Min A (steadying Pt. > 75%);3:  Sit > Supine: Mod A (lifting assist/Pt. 50-74%/lift 2  legs);3: Supine > Sit: Mod A (lifting assist/Pt. 50-74%/lift 2 legs  FIM - Locomotion: Wheelchair Distance: 50 Locomotion: Wheelchair: 2: Travels 50 - 149 ft with supervision, cueing or coaxing FIM - Locomotion: Ambulation Locomotion: Ambulation Assistive Devices: Walker - Rolling;Other (comment) (rollator) Ambulation/Gait Assistance: 5: Supervision Locomotion: Ambulation: 4: Travels 150 ft or more with minimal assistance (Pt.>75%)  Comprehension Comprehension Mode: Auditory Comprehension: 7-Follows complex conversation/direction: With no assist  Expression Expression Mode: Verbal Expression: 7-Expresses complex ideas: With no assist  Social Interaction Social Interaction: 7-Interacts appropriately with others - No medications needed.  Problem Solving Problem Solving: 7-Solves complex problems: Recognizes & self-corrects  Memory Memory: 7-Complete Independence: No helper  Medical Problem List and Plan:  1. Deconditioning related to kidney disease and multiple medical issues  2. DVT Prophylaxis/Anticoagulation: Acute DVT of right saphenofemoral junction extending into the common femoral vein. Superficial thrombus right greater saphenous vein. Heparin to Coumadin therapy  3. Pain Management: Hydrocodone as needed. Monitor with increased mobility  4. Neuropsych: This patient is capable of making decisions on her own behalf.  5. Chronic kidney disease with a history of kidney transplant 9 years ago. Followup renal services. Continue CellCept 250 mg twice a day and Prograf 1 mg twice a day. Latest creatinine 3.44   -add abdominal binder for hernia/comfort 6. Chronic anemia/pancytopenia. hgb 8.2 (up)  -Transfuse if symptomatic.  - Aranesp weekly.   -Monitor platelet counts currently 91,000 related to her CellCept and Prograf.   -Followup per hematology services  7. Right rotator cuff tendonitis---ice, scapular/RTC exercises with PT/OT  -consider injection if still painful.      LOS (Days) 5 A FACE TO FACE EVALUATION WAS PERFORMED  Meredith Staggers 06/25/2013 7:36 AM

## 2013-06-26 ENCOUNTER — Inpatient Hospital Stay (HOSPITAL_COMMUNITY): Payer: Medicare Other

## 2013-06-26 ENCOUNTER — Inpatient Hospital Stay (HOSPITAL_COMMUNITY): Payer: Medicare Other | Admitting: *Deleted

## 2013-06-26 ENCOUNTER — Encounter (HOSPITAL_COMMUNITY): Payer: Medicare Other | Admitting: Occupational Therapy

## 2013-06-26 DIAGNOSIS — R5381 Other malaise: Secondary | ICD-10-CM

## 2013-06-26 LAB — PROTIME-INR
INR: 2.93 — ABNORMAL HIGH (ref 0.00–1.49)
Prothrombin Time: 29.5 seconds — ABNORMAL HIGH (ref 11.6–15.2)

## 2013-06-26 MED ORDER — WARFARIN SODIUM 2 MG PO TABS
2.0000 mg | ORAL_TABLET | Freq: Once | ORAL | Status: AC
  Administered 2013-06-26: 2 mg via ORAL
  Filled 2013-06-26: qty 1

## 2013-06-26 NOTE — Progress Notes (Signed)
Physical Therapy Session Note  Patient Details  Name: [name] MRN: [id number] Date of Birth: [birth year]  Today's Date: 06/26/2013 Time: 1005-1100 Time Calculation (min): 55 min  Short Term Goals: Week 1:  PT Short Term Goal 1 (Week 1): STGs=LTGs due to anticipated LOS  Skilled Therapeutic Interventions/Progress Updates:  1:1. Pt received sitting in w/c with boyfriend present. Extensive education provided this session regarding pt's current level of functional mobility, appropriate STGs vs. LTGs, HEPs, OP PT, ADs and safety in home environment. Pt actively engaged in conversation, however, pt's boyfriend appeared to comprehend approx. 50% of education provided due to questions asked after they were previously covered. Pt and boyfriend continued to emphasize want to perform more "strength training," emphasized importance of functional endurance before graduating. Pt with more concerns regarding appropriate food intake/nutrition, deferred to physician for guidance regarding this matter. Pt introduced to use of yoga blocks for improved ability to utilize B UE w/ increased comfort in absence of arm rests. Pt and boyfriend verbalized understanding of education this session. Pt sitting in w/c with all needs in reach.   Therapy Documentation Precautions:  Precautions Precautions: Fall Restrictions Weight Bearing Restrictions: No  See FIM for current functional status  Therapy/Group: Individual Therapy  Gilmore Laroche 06/26/2013, 12:30 PM

## 2013-06-26 NOTE — Progress Notes (Signed)
Occupational Therapy Note  Patient Details  Name: [name] MRN: [id number] Date of Birth: 1960-06-17 Today's Date: 06/26/2013  Time: 1300-1345 Pt c/o 8/10 pain in right hip; RN aware and repositioned Individual Therapy  Pt's SO (Napoleon) present for family education. Focus on home safety, home and kitchen setup, and practiced shower stall transfer with RW.  Pt practiced shower transfer X 2.  Pt and SO verbalized and demonstrated understanding.  Recommended grab bars in shower, shower seat, BSC.  Pt and SO verbalized understanding.  Recommended pt and SO develop a daily schedule to optimize activity levels and routine.   Leroy Libman 06/26/2013, 2:39 PM

## 2013-06-26 NOTE — Progress Notes (Signed)
Occupational Therapy Session Note  Patient Details  Name: [name] MRN: [id number] Date of Birth: June 10, 1960  Today's Date: 06/26/2013 Time:  -     Skilled Therapeutic Interventions/Progress Updates:    1:1 self care retraining at shower level. Pt reports feeling like she is going to cry today. Pt expressed she is nervous about going home and having to come back to the hospital and she is sad about not being better than where she currently is functioning. Pt was hopeful to not be using an AD at home.  Discussed rehab goals and her progress since first being admitted to the hospital.  Practiced functional ambulation/ mobility, and transfers with Rollator. Pt able to navigate with the Rollator around the room with the brakes on with supervision. Setup of bathing and dressing similar to how she would at home. Practiced stepping into shower with Rollator turned to the side due to confined space in bathroom at home. Pt required extra time to complete all tasks. Discussed how she would want her boyfriend to assist at home and continued recommendations for grab bars in bathroom (especially shower).  Therapy Documentation Precautions:  Precautions Precautions: Fall Restrictions Weight Bearing Restrictions: No Pain:  soreness in hips and shoulders  See FIM for current functional status  Therapy/Group: Individual Therapy  Merrilee Seashore 06/26/2013, 10:06 AM

## 2013-06-26 NOTE — Progress Notes (Signed)
Social Work Patient ID: [name], female   DOB: 1960/07/04, 53 y.o.   MRN: [id number]   Have reviewed team conference with pt and bf.  Both aware and agreeable with targeted d/c date of 5/1.  Have discussed f/u, DME.  Family education completed this morning.  Will ask unit RD to discuss outpatient nutritional resources with pt as well.  Lennart Pall, LCSW

## 2013-06-26 NOTE — Progress Notes (Signed)
PMR Admission Coordinator Pre-Admission Assessment  Patient: [name] is an 53 y.o., female  MRN: [id number]  DOB: 08/24/1941  Height: 5\' 9"  (175.3 cm)  Weight: 91.354 kg (201 lb 6.4 oz)  Insurance Information  HMO: yes PPO: PCP: IPA: 80/20: OTHER:  PRIMARY: Blue Medicare Policy#: [id number] Subscriber: pt  CM Name: Santiago Glad Phone#: [phone] Fax#: [phone]  Pre-Cert#: [id number] Employer: retired  Benefits: Phone #: [phone] Name: 4/21  Eff. Date: 02/28/13 Deduct: none Out of Pocket Max: $4500 Life Max: none  CIR: $250 copay per day days 1-6 then covers 100% SNF: no copay days 1-20; $60 copay per day days 21-100  Outpatient: $35 per visit Co-Pay: no visit limit  Home Health: 100% Co-Pay: none  DME: 80% Co-Pay: 20%  Providers: in network   SECONDARY: none  Medicaid Application Date: Case Manager:  Disability Application Date: Case Worker:  Emergency Contact Information  Contact Information    Name  Relation  Home  Work  Mobile    Motley,Carolyn  Spouse  [phone]   [phone]      Current Medical History  Patient Admitting Diagnosis: lumbar spondylolisthesis, severe polyarticular gout  History of Present Illness: [name] is a 53 y.o. right-handed female with history of hypertension as well as sleep apnea and gout. Patient was independent prior to admission living with his wife. Admitted 06/13/2013 with progressive low back pain that has progressed over the last 2 years. No relief with conservative care including epidural injections. X-rays and imaging revealed lumbar L4-5 spondylolisthesis with severe lumbar stenosis as well as L3-L4 stenosis chronic radiculopathy. Underwent bilateral L3 laminectomy, foraminotomy bilateral L4-5 discectomy posterior lateral arthrodesis 06/15/2013 per Dr. Joya Salm. Postoperative pain management. Back brace when out of bed applied in sitting position. Subcutaneous heparin for DVT prophylaxis.  Past Medical History  Past Medical History   Diagnosis   Date   .  Hypertension    .  Sleep apnea      cpap 7 yrs   .  History of kidney stones    .  Arthritis    .  Gout    .  Cancer  01     colon    Family History  family history is not on file.  Prior Rehab/Hospitalizations: none  Current Medications  Current facility-administered medications:0.9 % sodium chloride infusion, 250 mL, Intravenous, Continuous, Floyce Stakes, MD; 0.9 % sodium chloride infusion, , Intravenous, Continuous, Floyce Stakes, MD, Last Rate: 75 mL/hr at 06/15/13 2223, 75 mL/hr at 06/15/13 2223; acetaminophen (TYLENOL) suppository 650 mg, 650 mg, Rectal, Q4H PRN, Floyce Stakes, MD  acetaminophen (TYLENOL) tablet 650 mg, 650 mg, Oral, Q4H PRN, Floyce Stakes, MD, 650 mg at 06/20/13 0704; amLODipine (NORVASC) tablet 5 mg, 5 mg, Oral, Daily, Floyce Stakes, MD, 5 mg at 06/20/13 1001; bisacodyl (DULCOLAX) EC tablet 5 mg, 5 mg, Oral, Daily PRN, Floyce Stakes, MD, 5 mg at 06/17/13 2154; colchicine tablet 0.6 mg, 0.6 mg, Oral, TID, Floyce Stakes, MD, 0.6 mg at 06/20/13 1001  diphenhydrAMINE (BENADRYL) capsule 25 mg, 25 mg, Oral, Q6H PRN, Elaina Hoops, MD, 25 mg at 06/15/13 2115; febuxostat (ULORIC) tablet 40 mg, 40 mg, Oral, Daily PRN, Floyce Stakes, MD; heparin injection 5,000 Units, 5,000 Units, Subcutaneous, 3 times per day, Floyce Stakes, MD, 5,000 Units at 06/20/13 0704; HYDROcodone-acetaminophen (NORCO) 10-325 MG per tablet 1 tablet, 1 tablet, Oral, Q4H PRN, Floyce Stakes, MD  lactated ringers infusion, , Intravenous, Continuous, Albertha Ghee, MD,  Last Rate: 20 mL/hr at 06/14/13 0723; loperamide (IMODIUM) capsule 4 mg, 4 mg, Oral, PRN, Floyce Stakes, MD; menthol-cetylpyridinium (CEPACOL) lozenge 3 mg, 1 lozenge, Oral, PRN, Floyce Stakes, MD; ondansetron [facility]) injection 4 mg, 4 mg, Intravenous, Q4H PRN, Floyce Stakes, MD, 4 mg at 06/19/13 0943  phenol (CHLORASEPTIC) mouth spray 1 spray, 1 spray, Mouth/Throat, PRN, Floyce Stakes, MD; sodium  chloride 0.9 % injection 3 mL, 3 mL, Intravenous, Q12H, Floyce Stakes, MD, 3 mL at 06/20/13 1002; sodium chloride 0.9 % injection 3 mL, 3 mL, Intravenous, PRN, Floyce Stakes, MD; sodium phosphate (FLEET) 7-19 GM/118ML enema 1 enema, 1 enema, Rectal, Daily PRN, Floyce Stakes, MD, 1 enema at 06/18/13 0107  tamsulosin [facility]) capsule 0.4 mg, 0.4 mg, Oral, Daily, Floyce Stakes, MD, 0.4 mg at 06/20/13 1001  Patients Current Diet: Cardiac  Precautions / Restrictions  Precautions  Precautions: Back;Fall  Precaution Booklet Issued: Yes (comment)  Precaution Comments: Educated on precautions  Spinal Brace: Lumbar corset;Applied in sitting position  Restrictions  Weight Bearing Restrictions: No  Prior Activity Level  Community (5-7x/wk): active and independent  Development worker, international aid / Equipment  Home Assistive Devices/Equipment: CPAP;Eyeglasses;Raised toilet seat with rails;Shower chair without back  Home Equipment: [place] - 2 wheels;Cane - single point;Bedside commode;Shower seat;Wheelchair - Press photographer  Prior Functional Level  Prior Function  Level of Independence: Independent with assistive device(s)  Current Functional Level  Cognition  Overall Cognitive Status: Within Functional Limits for tasks assessed  Orientation Level: Oriented X4   Extremity Assessment  (includes Sensation/Coordination)     ADLs  Overall ADL's : Needs assistance/impaired  Eating/Feeding: Independent;Sitting  Grooming: Wash/dry face;Sitting;Set up;Supervision/safety  Upper Body Bathing: Sitting;Set up;Supervision/ safety  Lower Body Bathing: Sit to/from stand;Moderate assistance  Upper Body Dressing : Minimal assistance;Sitting (back brace)  Lower Body Dressing: Moderate assistance;With adaptive equipment;Sit to/from stand  Toilet Transfer: Minimal assistance;Stand-pivot  Toileting- Clothing Manipulation and Hygiene: Moderate assistance (standing)  Functional mobility during ADLs: Minimal  assistance;Rolling walker  General ADL Comments: Educated on use of cup for teeth care. Educated on AE for LB ADLs and pt practiced with reacher and sockaid. Educated on toilet aid for hygiene if needed. Pt washed peri area and tops of legs due to urinating some on floor. Educated on safety with ADLs.   Mobility  Overal bed mobility: Needs Assistance  Bed Mobility: Rolling;Sidelying to Sit  Rolling: Min assist  Sidelying to sit: Mod assist (and heavy use of the rail)  Sit to sidelying: Mod assist  General bed mobility comments: cuing for technique and truncal assist for roll and to sit EOB.   Transfers  Overall transfer level: Needs assistance  Equipment used: Rolling walker (2 wheeled);None  Transfers: Sit to/from Stand  Sit to Stand: Mod assist  Stand pivot transfers: Min assist;From elevated surface  General transfer comment: cuing for hand placement and safety. Pt having a disproportionately hard time coming forward an appropriate amount.   Ambulation / Gait / Stairs / Wheelchair Mobility  Ambulation/Gait  Ambulation/Gait assistance: Fish farm manager (Feet): 360 Feet  Assistive device: Rolling walker (2 wheeled)  Gait Pattern/deviations: Step-through pattern  Gait velocity: slow and guarded  Gait velocity interpretation: Below normal speed for age/gender  General Gait Details: short equal steps with flexed posture.   Posture / Balance  Dynamic Sitting Balance  Sitting balance - Comments: lists L with slightest challenge   Special needs/care consideration  BiPAP/CPAP yes has home CPAP  Bowel mgmt: continent but has had diarrhea since enema postop  Bladder mgmt:continent   Previous Home Environment  Living Arrangements: Spouse/significant other  Lives With: Spouse  Available Help at Discharge: Family;Available 24 hours/day  Type of Home: House  Home Layout: One level  Home Access: Stairs to enter  Entrance Stairs-Rails: None  Entrance Stairs-Number of Steps: 2   Bathroom Shower/Tub: Tourist information centre manager: Handicapped height  Bathroom Accessibility: Yes  How Accessible: Accessible via walker  [place]: No  Discharge Living Setting  Plans for Discharge Living Setting: Patient's home;Lives with (comment);Other (Comment) (spouse)  Type of Home at Discharge: House  Discharge Home Layout: One level  Discharge Home Access: Stairs to enter  Entrance Stairs-Rails: None  Entrance Stairs-Number of Steps: 2  Discharge Bathroom Shower/Tub: Walk-in shower  Discharge Bathroom Toilet: Handicapped height  Discharge Bathroom Accessibility: Yes  How Accessible: Accessible via walker  Does the patient have any problems obtaining your medications?: No  Social/Family/Support Systems  Patient Roles: Spouse;Parent  Contact Information: Napoleon Form, wife  Anticipated Caregiver: wife and son  Anticipated Caregiver's Contact Information: home [phone]; cell [phone]  Ability/Limitations of Caregiver: no limitations  Caregiver Availability: 24/7  Discharge Plan Discussed with Primary Caregiver: Yes  Is Caregiver In Agreement with Plan?: Yes  Does Caregiver/Family have Issues with Lodging/Transportation while Pt is in Rehab?: No (wife and son stay with pt in hospital 24/7)  Goals/Additional Needs  Patient/Family Goal for Rehab: supervsision with PT and OT  Expected length of stay: ELOS 10 to 14 days BUT pt states it will be much less  Special Service Needs: having diarrhea since enema postop  Pt/Family Agrees to Admission and willing to participate: Yes  Program Orientation Provided & Reviewed with Pt/Caregiver Including Roles & Responsibilities: Yes  Decrease burden of Care through IP rehab admission: n/a  Possible need for SNF placement upon discharge: n/a  Patient Condition: This patient's medical and functional status has changed since the consult dated: 06/18/13 in which the Rehabilitation Physician determined and documented that the  patient's condition is appropriate for intensive rehabilitative care in an inpatient rehabilitation facility. See "History of Present Illness" (above) for medical update. Functional changes are: overall min assist. Patient's medical and functional status update has been discussed with the Rehabilitation physician and patient remains appropriate for inpatient rehabilitation. Will admit to inpatient rehab today.  Preadmission Screen Completed By: Cleatrice Burke, 06/20/2013 12:11 PM  ______________________________________________________________________  Discussed status with Dr. Letta Pate on 06/20/13 at 1211 and received telephone approval for admission today.  Admission Coordinator: Cleatrice Burke, time [id number] Date 06/20/13.  Cosigned by: Charlett Blake, MD [06/20/2013 12:20 PM

## 2013-06-26 NOTE — Progress Notes (Signed)
[place] PHYSICAL MEDICINE & REHABILITATION     PROGRESS NOTE    Subjective/Complaints: Right shoulder a little better. Felt binder was helpful.   12 point review of systems has been performed and if not noted above is otherwise negative.    Objective: Vital Signs: Blood pressure 99/61, pulse 86, temperature 98.1 F (36.7 C), temperature source Oral, resp. rate 18, height 5\' 3"  (1.6 m), weight 46.1 kg (101 lb 10.1 oz), SpO2 99.00%. No results found.  Recent Labs  06/25/13 0643  WBC 4.3  HGB 8.2*  HCT 27.0*  PLT 278    Recent Labs  06/24/13 0740  NA 136*  K 5.5*  CL 99  GLUCOSE 103*  BUN 54*  CREATININE 3.36*  CALCIUM 8.6   CBG (last 3)  No results found for this basename: GLUCAP,  in the last 72 hours  Wt Readings from Last 3 Encounters:  06/20/13 46.1 kg (101 lb 10.1 oz)  06/18/13 44.271 kg (97 lb 9.6 oz)  06/08/11 58.968 kg (130 lb)    Physical Exam:  Constitutional: She is oriented to person, place, and time. Frail appearing HENT: oral mucosa pink and moist Head: Normocephalic.  Eyes: EOM are normal.  Neck: Normal range of motion. Neck supple. No tracheal deviation present. No thyromegaly present.  Cardiovascular: Normal rate and regular rhythm.  Respiratory: Effort normal and breath sounds normal. No respiratory distress.  GI: Soft. Bowel sounds are normal. She exhibits no distension.  Neurological: She is alert and oriented to person, place, and time. Normal insight and awareness. No cranial nerve deficit. She exhibits normal muscle tone. Coordination normal.  UE 4- deltoid, 4 bicep and tricep, 4+HI, HF 3+, KE 4, ankles 4+. No sensory findings  Musc: right shoulder impingement testing +, ?less tender Skin: Skin is warm and dry.  Psychiatric: She has a normal mood and affect    Assessment/Plan: 1. Functional deficits secondary to deconditioning related to kidney transplant failure and rejection which require 3+ hours per day of interdisciplinary  therapy in a comprehensive inpatient rehab setting. Physiatrist is providing close team supervision and 24 hour management of active medical problems listed below. Physiatrist and rehab team continue to assess barriers to discharge/monitor patient progress toward functional and medical goals.  Discussed realistic goals here and after dc. Reviewed nutrition.   FIM: FIM - Bathing Bathing Steps Patient Completed: Chest;Right Arm;Left Arm;Abdomen;Front perineal area;Buttocks;Right upper leg;Left upper leg;Right lower leg (including foot);Left lower leg (including foot) Bathing: 4: Steadying assist (use of LH sponge)  FIM - Upper Body Dressing/Undressing Upper body dressing/undressing steps patient completed: Thread/unthread right sleeve of pullover shirt/dresss;Thread/unthread left sleeve of pullover shirt/dress;Put head through opening of pull over shirt/dress;Pull shirt over trunk;Thread/unthread right bra strap;Thread/unthread left bra strap;Hook/unhook bra Upper body dressing/undressing: 5: Set-up assist to: Obtain clothing/put away FIM - Lower Body Dressing/Undressing Lower body dressing/undressing steps patient completed: Pull underwear up/down;Fasten/unfasten pants;Pull pants up/down;Thread/unthread left underwear leg;Thread/unthread left pants leg;Thread/unthread right pants leg;Don/Doff left sock;Don/Doff right sock Lower body dressing/undressing: 4: Min-Patient completed 75 plus % of tasks  FIM - Toileting Toileting steps completed by patient: Adjust clothing prior to toileting;Performs perineal hygiene;Adjust clothing after toileting Toileting Assistive Devices: Grab bar or rail for support Toileting: 5: Supervision: Safety issues/verbal cues  FIM - Radio producer Devices: Elevated toilet seat;Walker Toilet Transfers: 4-To toilet/BSC: Min A (steadying Pt. > 75%);5-From toilet/BSC: Supervision (verbal cues/safety issues)  FIM - Pensions consultant Assistive Devices: HOB elevated;Bed rails Bed/Chair Transfer: 4: Bed >  Chair or W/C: Min A (steadying Pt. > 75%);4: Supine > Sit: Min A (steadying Pt. > 75%/lift 1 leg)  FIM - Locomotion: Wheelchair Distance: 50 Locomotion: Wheelchair: 0: Activity did not occur FIM - Locomotion: Ambulation Locomotion: Ambulation Assistive Devices: Other (comment) (rollator) Ambulation/Gait Assistance: 5: Supervision Locomotion: Ambulation: 5: Travels 150 ft or more with supervision/safety issues  Comprehension Comprehension Mode: Auditory Comprehension: 7-Follows complex conversation/direction: With no assist  Expression Expression Mode: Verbal Expression: 7-Expresses complex ideas: With no assist  Social Interaction Social Interaction: 7-Interacts appropriately with others - No medications needed.  Problem Solving Problem Solving: 7-Solves complex problems: Recognizes & self-corrects  Memory Memory: 7-Complete Independence: No helper  Medical Problem List and Plan:  1. Deconditioning related to kidney disease and multiple medical issues  2. DVT Prophylaxis/Anticoagulation: Acute DVT of right saphenofemoral junction extending into the common femoral vein. Superficial thrombus right greater saphenous vein. Heparin to Coumadin therapy  3. Pain Management: Hydrocodone as needed. Monitor with increased mobility  4. Neuropsych: This patient is capable of making decisions on her own behalf.  5. Chronic kidney disease with a history of kidney transplant 9 years ago. Followup renal services. Continue CellCept 250 mg twice a day and Prograf 1 mg twice a day. Latest creatinine 3.44   -add abdominal binder for hernia/comfort 6. Chronic anemia/pancytopenia. hgb 8.2 (up)  -Transfuse if symptomatic.  - Aranesp weekly.   -Monitor platelet counts currently 91,000 related to her CellCept and Prograf.   -Followup per hematology services  7. Right rotator cuff tendonitis---ice,  scapular/RTC exercises with PT/OT  -consider injection potentially    LOS (Days) 6 A FACE TO FACE EVALUATION WAS PERFORMED  Meredith Staggers 06/26/2013 7:36 AM

## 2013-06-26 NOTE — Progress Notes (Signed)
Physical Therapy Session Note  Patient Details  Name: [name] MRN: [id number] Date of Birth: [birth year]  Today's Date: 06/26/2013 Time: [id number] Time Calculation (min): 30 min  Short Term Goals: Week 1:  PT Short Term Goal 1 (Week 1): STGs=LTGs due to anticipated LOS  Skilled Therapeutic Interventions/Progress Updates:    Patient received sitting in wheelchair with boyfriend, Napolean, present. Session focused on gait training and review of home stretching program. Encouraged patient's boyfriend to perform assistance/guarding during gait training, however, boyfriend states he "would rather watch." Patient performed gait training in controlled environment >150' with RW and supervision. Review of home stretching program via teach back method and use of handout, min cues for accuracy with technique. Patient returned to room and left seated in wheelchair with all needs within reach and boyfriend present.  Therapy Documentation Precautions:  Precautions Precautions: Fall Precaution Comments: Suppressed immune system precautions Restrictions Weight Bearing Restrictions: No Pain: Pain Assessment Pain Assessment: No/denies pain Pain Score: 0-No pain  See FIM for current functional status  Therapy/Group: Individual Therapy  Lillia Abed. Auden Tatar, PT, DPT 06/26/2013, 3:59 PM

## 2013-06-26 NOTE — Progress Notes (Signed)
Coumadin per pharmacy  Anticoagulation: new R DVT- on coumadin. Acute DVT of right saphenofemoral junction extending into the common femoral vein. Superficial thrombus right greater saphenous vein.  INR 2.93. Remains therapeutic.  Completed 5 day overlap with heparin on 06/21/13.  Hgb 8.2, pltc 278K yesterday, no bleeding noted. Deconditioning related to kidney disease and multiple medical issues. H/o chronic kidney disease with a history of kidney transplant 9 years ago and chronic anemia/pancytopenia. hgb 8.2 (up).  Ate 100% breakfast today    INR goal 2-3  INR remains within goal on ~2 mg daily over the last 5 days. (2.5mg  x 1 given on 4/25).   Plan: -Warfarin 2 mg tonight - daily PT/INR - coumadin educated

## 2013-06-26 NOTE — Patient Care Conference (Signed)
Inpatient RehabilitationTeam Conference and Plan of Care Update Date: 06/25/2013   Time: 3:15 PM    Patient Name: [name]      Medical Record Number: [id number]  Date of Birth: 07-Sep-1960 Sex: Female         Room/Bed: 4M07C/4M07C-01 Payor Info: Payor: Holley Bouche MEDICARE / Plan: [employer] MEDICARE / Product Type: *No Product type* /    Admitting Diagnosis: Deconditioned  Admit Date/Time:  06/20/2013  5:13 PM Admission Comments: No comment available   Primary Diagnosis:  <principal problem not specified> Principal Problem: <principal problem not specified>  Patient Active Problem List   Diagnosis Date Noted  . Physical deconditioning 06/20/2013  . Kidney transplant failure and rejection 06/13/2013    Class: Chronic  . Adverse effect of immunosuppressive drug 06/13/2013    Class: Acute  . Chronic kidney disease, stage IV (severe) 06/13/2013    Class: Chronic  . Protein-calorie malnutrition, severe 06/13/2013  . Anemia 06/12/2013    Expected Discharge Date: Expected Discharge Date: 06/28/13  Team Members Present: Physician leading conference: Dr. Alger Simons Social Worker Present: Lennart Pall, LCSW Nurse Present: Elliot Cousin, RN PT Present: Melene Plan, Cottie Banda, PT OT Present: Gareth Morgan, OT;Roanna Epley, COTA SLP Present: Gunnar Fusi, SLP PPS Coordinator present : Ileana Ladd, Lelan Pons, RN, [facility]     Current Status/Progress Goal Weekly Team Focus  Medical   renal transplant, deconditioning. right rotator cuff, pain from renal failure  improving stamina, safety  realistic therapy goals, pain mgt, volume mgt   Bowel/Bladder   Continent with occasional incontinent episode per report. LBM: 06/23/2013  Remain continent of B/B  OOB to [facility] during the day and at night   Swallow/Nutrition/ Hydration             ADL's             Mobility   Overall supervision w/ rollator  Mod(I)-Supervision  functional endurance, B LE strength/ROM,  safety during funcitonal mobility, d/c planning   Communication             Safety/Cognition/ Behavioral Observations            Pain   Pain managed with Tyl 650mg  q 8 hrs prn, or Vicodin 5-325mg  1-2 tabs q 4 hrs prn. Denied pain.  Pain managed at 2 or less  Continue to monitor for pain q 6-8 hours   Skin   Skin's remarkable  No new skin breakdown  Monitor for skin breakdown q shift    Rehab Goals Patient on target to meet rehab goals: Yes *See Care Plan and progress notes for long and short-term goals.  Barriers to Discharge: unrealistic goal setting, stamina    Possible Resolutions to Barriers:  assist with goal setting, address pain issues    Discharge Planning/Teaching Needs:  home with intermittent assist of boyfriend  boyfriend to be here tomorrow morning for ed   Team Discussion:  Multiple medical issues which have led to current deconditioned state.  Improving labs/ volumes.  Somewhat unrealistic about her own short and long term goals.  MD to address further.  Planning ed for tomorrow.  Revisions to Treatment Plan:  None   Continued Need for Acute Rehabilitation Level of Care: The patient requires daily medical management by a physician with specialized training in physical medicine and rehabilitation for the following conditions: Daily direction of a multidisciplinary physical rehabilitation program to ensure safe treatment while eliciting the highest outcome that is of practical value to the patient.: Yes  Daily medical management of patient stability for increased activity during participation in an intensive rehabilitation regime.: Yes Daily analysis of laboratory values and/or radiology reports with any subsequent need for medication adjustment of medical intervention for : Post surgical problems;Pulmonary problems  Lennart Pall 06/26/2013, 4:31 PM

## 2013-06-27 ENCOUNTER — Inpatient Hospital Stay (HOSPITAL_COMMUNITY): Payer: Medicare Other

## 2013-06-27 ENCOUNTER — Inpatient Hospital Stay (HOSPITAL_COMMUNITY): Payer: Medicare Other | Admitting: *Deleted

## 2013-06-27 ENCOUNTER — Encounter (HOSPITAL_COMMUNITY): Payer: Medicare Other

## 2013-06-27 LAB — PROTIME-INR
INR: 2.33 — AB (ref 0.00–1.49)
Prothrombin Time: 24.8 seconds — ABNORMAL HIGH (ref 11.6–15.2)

## 2013-06-27 MED ORDER — WARFARIN SODIUM 2 MG PO TABS
2.0000 mg | ORAL_TABLET | Freq: Once | ORAL | Status: AC
  Administered 2013-06-27: 2 mg via ORAL
  Filled 2013-06-27: qty 1

## 2013-06-27 NOTE — Progress Notes (Addendum)
Physical Therapy Discharge Summary  Patient Details  Name: [name] MRN: [id number] Date of Birth: April 11, 1960  Today's Date: 06/27/2013 Time: 1100-1156 Time Calculation (min): 56 min  Patient has met 9 of 9 long term goals due to improved activity tolerance, improved balance, improved postural control, increased strength, decreased pain, ability to compensate for deficits and improved coordination.  Patient to discharge at an ambulatory level Modified Independent.   Patient's care partner is independent to provide the necessary supervision/set up assistance with negotiation of stairs, car transfers, and community ambulation at discharge.  Reasons goals not met: N/A, all LTGs met.  Recommendation:  Patient will benefit from ongoing skilled PT services in outpatient setting to continue to advance safe functional mobility, address ongoing impairments in strength, balance, coordination, activity tolerance, gait, overall functional mobility, and minimize fall risk.  Equipment: No equipment provided  Reasons for discharge: treatment goals met and discharge from hospital  Patient/family agrees with progress made and goals achieved: Yes  PT Discharge Precautions/Restrictions Precautions Precautions: None (mod I in room today, 06/27/13) Precaution Comments: Suppressed immune system precautions Restrictions Weight Bearing Restrictions: No Pain Pain Assessment Pain Assessment: No/denies pain Pain Score: 0-No pain Cognition Overall Cognitive Status: Within Functional Limits for tasks assessed Orientation Level: Oriented X4 Attention: Alternating;Selective Selective Attention: Appears intact Alternating Attention: Appears intact Memory: Appears intact Awareness: Appears intact Problem Solving: Appears intact Safety/Judgment: Appears intact Sensation Sensation Light Touch: Appears Intact Proprioception: Appears Intact Coordination Gross Motor Movements are Fluid and Coordinated:  No Fine Motor Movements are Fluid and Coordinated: Yes Coordination and Movement Description: Limited due to pelvic pain/stiffness Motor  Motor Motor: Other (comment) Motor - Discharge Observations: generalized weakness  Mobility Bed Mobility Bed Mobility: Supine to Sit;Sit to Supine Supine to Sit: 6: Modified independent (Device/Increase time);HOB flat Sit to Supine: 6: Modified independent (Device/Increase time);HOB flat Transfers Transfers: Yes Sit to Stand: 6: Modified independent (Device/Increase time);With armrests;With upper extremity assist;From bed;From chair/3-in-1 Stand to Sit: 6: Modified independent (Device/Increase time);With upper extremity assist;With armrests;To bed;To chair/3-in-1 Stand Pivot Transfers: 6: Modified independent (Device/Increase time);With armrests (with RW) Locomotion  Ambulation Ambulation: Yes Ambulation/Gait Assistance: 6: Modified independent (Device/Increase time) Ambulation Distance (Feet): 200 Feet Assistive device: Rolling walker Ambulation/Gait Assistance Details: 200' x2 in controlled environment and 91' x1 in home environment with mod I.  Gait Gait: Yes Gait Pattern: Impaired Gait Pattern: Decreased step length - right;Decreased step length - left;Decreased hip/knee flexion - right;Decreased hip/knee flexion - left;Decreased weight shift to left;Decreased weight shift to right;Decreased stride length Stairs / Additional Locomotion Stairs: Yes Stairs Assistance: 5: Supervision Stairs Assistance Details: Verbal cues for sequencing Stair Management Technique: No rails;With walker;Forwards;Step to pattern Number of Stairs: 1 Height of Stairs: 5 Curb: 5: Supervision (with RW) Wheelchair Mobility Wheelchair Mobility: No (patient is ambulatory)  Trunk/Postural Assessment  Cervical Assessment Cervical Assessment: Within Functional Limits Thoracic Assessment Thoracic Assessment: Within Functional Limits Lumbar Assessment Lumbar  Assessment: Within Functional Limits Postural Control Postural Control: Deficits on evaluation Postural Limitations: decreased pelvic mobility due to pain and stiffness  Balance Balance Balance Assessed: Yes Static Sitting Balance Static Sitting - Balance Support: No upper extremity supported;Feet supported Static Sitting - Level of Assistance: 7: Independent Dynamic Sitting Balance Dynamic Sitting - Balance Support: No upper extremity supported;Feet supported;Feet unsupported;During functional activity Dynamic Sitting - Level of Assistance: 6: Modified independent (Device/Increase time) Dynamic Sitting - Balance Activities: Lateral lean/weight shifting;Forward lean/weight shifting;Reaching for objects Static Standing Balance Static Standing - Balance Support: Bilateral upper extremity supported;No  upper extremity supported Static Standing - Level of Assistance: 6: Modified independent (Device/Increase time) Dynamic Standing Balance Dynamic Standing - Balance Support: Bilateral upper extremity supported;No upper extremity supported;During functional activity Dynamic Standing - Level of Assistance: 6: Modified independent (Device/Increase time) Dynamic Standing - Balance Activities: Lateral lean/weight shifting;Forward lean/weight shifting;Reaching for objects Extremity Assessment  RLE Assessment RLE Assessment: Exceptions to [facility] LLE Assessment LLE Assessment: Exceptions to [facility]  See FIM for current functional status  [name] S Flint Melter. Brinson Tozzi, PT, DPT 06/27/2013, 4:21 PM

## 2013-06-27 NOTE — Discharge Summary (Signed)
Discharge summary job # [id number]

## 2013-06-27 NOTE — Discharge Summary (Signed)
[name], HUND                ACCOUNT NO.:  [id number]  MEDICAL RECORD NO.:  [id number]  LOCATION:  4M07C                        FACILITY:  [place] [facility]  PHYSICIAN:  Meredith Staggers, M.D.DATE OF BIRTH:  October 31, 1960  DATE OF ADMISSION:  06/20/2013 DATE OF DISCHARGE:                              DISCHARGE SUMMARY   DISCHARGE DIAGNOSES: 1. Deconditioning related to kidney disease multi medical. 2. Acute deep vein thrombosis, right saphenofemoral junction extending     into the common femoral vein.  Superficial thrombus right greater     saphenous vein with Coumadin therapy. 3. Pain management. 4. Chronic kidney disease with history of kidney transplant 9 years     ago. 5. Chronic anemia. 6. Right rotator cuff tendonitis.  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 The patient has lived in [place] since 2012, was independent until September with generalized decline, living with her boyfriend. Presented on June 12, 2013, with generalized weakness, pancytopenia, and recent weight loss.  Noted decrease in hemoglobin from 10 on May 12, 2013, to 5.9.  Chest x-ray negative.  Hematology Service was consulted on June 14, 2013, with workup ongoing, question plan for bone marrow biopsy to be completed as outpatient.  Monitor platelet counts, currently 91,000 with question related to her CellCept and Prograf. Venous Doppler studies completed showed bilateral lower extremities for DVT right saphenofemoral junction extending into the common femoral vein.  Superficial thrombus noted right greater saphenous vein.  Heparin and Coumadin initiated on June 15, 2013.  Renal Service followup for creatinine 3.47 and monitored.  The patient transfused throughout her hospital course as needed.  Hemoglobin 7.4, felt to be chronic  in nature, monitor while on anticoagulants.  Physical and occupational therapy ongoing.  The patient was admitted for a comprehensive rehab program.  PAST MEDICAL HISTORY:  See discharge diagnoses.  SOCIAL HISTORY:  Lives with boyfriend.  FUNCTIONAL HISTORY:  Prior to admission, independent.  Functional status upon admission to rehab services, minimal assist to ambulate with a rolling walker, minimal guard sit to stand, min to mod assist for activities of daily living.  PHYSICAL EXAMINATION:  VITAL SIGNS:  Blood pressure 82/54, pulse 104, temperature 99, respirations 17. GENERAL: This was an alert, frail female in no acute distress, oriented x3. HEENT:  Pupils round and reactive to light. LUNGS:  Clear to auscultation. CARDIAC:  Regular rate and rhythm. ABDOMEN:  Soft, nontender.  Good bowel sounds.  REHABILITATION HOSPITAL COURSE:  The patient was admitted to inpatient rehab services with therapies initiated on a 3-hour daily basis consisting of physical therapy, occupational therapy, and rehabilitation nursing.  The following issues were addressed during the patient's rehabilitation stay.  Pertaining to [name] deconditioning related to multi medical issues, chronic kidney disease remained stable.  She was participating with therapies.  She had remained on Coumadin therapy for acute DVT right saphenofemoral junction extending into the common femoral vein.  Latest INR of 2.93.  She presently would follow up with Dr. Elmarie Shiley, [phone].  Also arrangements made to follow up with [facility].  Chronic kidney disease with history of kidney transplant 9 years ago, monitor closely while on CellCept as well as Prograf with noted chronic anemia, transfuse only if needed.  She remained on Aranesp as advised.  She would follow up with Hematology outpatient, questionable need for bone marrow biopsy.  Her blood pressures remained well controlled throughout her rehab course.   The patient received weekly collaborative interdisciplinary team conferences to discuss estimated length of stay, family teaching, and any barriers to discharge.  She was ambulating 150 feet with a rolling walker, supervision.  Strength and endurance continued to improve.  Activities of daily living, focused on home safety, home and kitchen setup, practice shower, stall transfers, and rolling walker.  Recommended grab bars in her shower.  The patient with good understanding of overall need for strengthening.  Full family teaching completed.  Plan was to be discharged to home.  DISCHARGE MEDICATIONS: 1. Rocaltrol 1 mcg p.o. daily. 2. Tums tablets 500 mg 2 tablets t.i.d. 3. Sensipar 30 mg p.o. daily. 4. Hydrocodone 1-2 tablets every 4 as needed for moderate pain,     dispense of 90 tablets. 5. Magnesium oxide 400 mg p.o. t.i.d. 6. Multivitamin daily. 7. CellCept 250 mg p.o. b.i.d. 8. Sodium bicarbonate 1300 mg p.o. t.i.d. 9. Prograf 1 mg p.o. b.i.d. 10.Coumadin 2 mg daily, adjusted accordingly for INR of 2.0- 3.0 for     DVT.  DIET:  Regular.  SPECIAL INSTRUCTIONS:  The patient would follow up with Dr. Alger Simons at the outpatient rehab service office as directed; Dr. Concha Norway, Hematology Service, call for appointment; Dr. Elmarie Shiley, medical Management.  Patient will make followup appointment 07/02/2013 with Dr. Elmarie Shiley [phone] to follow Coumadin therapy     Lauraine Rinne, P.A.   ______________________________ Meredith Staggers, M.D.    DA/MEDQ  D:  06/27/2013  T:  06/27/2013  Job:  [id number]  cc:   Elmarie Shiley, MD Concha Norway, MD

## 2013-06-27 NOTE — Progress Notes (Signed)
[facility] PHYSICAL MEDICINE & REHABILITATION     PROGRESS NOTE    Subjective/Complaints: Things went well yesterday. Education went well.   12 point review of systems has been performed and if not noted above is otherwise negative.    Objective: Vital Signs: Blood pressure 98/66, pulse 91, temperature 97.8 F (36.6 C), temperature source Oral, resp. rate 20, height 5\' 3"  (1.6 m), weight 48.127 kg (106 lb 1.6 oz), SpO2 99.00%. No results found.  Recent Labs  06/25/13 0643  WBC 4.3  HGB 8.2*  HCT 27.0*  PLT 278    Recent Labs  06/24/13 0740  NA 136*  K 5.5*  CL 99  GLUCOSE 103*  BUN 54*  CREATININE 3.36*  CALCIUM 8.6   CBG (last 3)  No results found for this basename: GLUCAP,  in the last 72 hours  Wt Readings from Last 3 Encounters:  06/26/13 48.127 kg (106 lb 1.6 oz)  06/18/13 44.271 kg (97 lb 9.6 oz)  06/08/11 58.968 kg (130 lb)    Physical Exam:  Constitutional: She is oriented to person, place, and time. Frail appearing HENT: oral mucosa pink and moist Head: Normocephalic.  Eyes: EOM are normal.  Neck: Normal range of motion. Neck supple. No tracheal deviation present. No thyromegaly present.  Cardiovascular: Normal rate and regular rhythm.  Respiratory: Effort normal and breath sounds normal. No respiratory distress.  GI: Soft. Bowel sounds are normal. She exhibits no distension.  Neurological: She is alert and oriented to person, place, and time. Normal insight and awareness. No cranial nerve deficit. She exhibits normal muscle tone. Coordination normal.  UE 4- deltoid, 4 bicep and tricep, 4+HI, HF 3+, KE 4, ankles 4+. No sensory findings  Musc: right shoulder impingement testing +, ?less tender Skin: Skin is warm and dry.  Psychiatric: She has a normal mood and affect    Assessment/Plan: 1. Functional deficits secondary to deconditioning related to kidney transplant failure and rejection which require 3+ hours per day of interdisciplinary therapy  in a comprehensive inpatient rehab setting. Physiatrist is providing close team supervision and 24 hour management of active medical problems listed below. Physiatrist and rehab team continue to assess barriers to discharge/monitor patient progress toward functional and medical goals.  Home today. outpt follow up. ?[facility]?. See me in amonth. Discussed follow up issues today.   FIM: FIM - Bathing Bathing Steps Patient Completed: Chest;Right Arm;Left Arm;Abdomen;Front perineal area;Buttocks;Right upper leg;Left upper leg;Right lower leg (including foot);Left lower leg (including foot) Bathing: 6: More than reasonable amount of time (use of LH sponge)  FIM - Upper Body Dressing/Undressing Upper body dressing/undressing steps patient completed: Thread/unthread right sleeve of pullover shirt/dresss;Thread/unthread left sleeve of pullover shirt/dress;Put head through opening of pull over shirt/dress;Pull shirt over trunk;Thread/unthread right bra strap;Thread/unthread left bra strap;Hook/unhook bra Upper body dressing/undressing: 6: More than reasonable amount of time FIM - Lower Body Dressing/Undressing Lower body dressing/undressing steps patient completed: Pull underwear up/down;Fasten/unfasten pants;Pull pants up/down;Thread/unthread left underwear leg;Thread/unthread left pants leg;Thread/unthread right pants leg;Don/Doff left sock;Don/Doff right sock;Thread/unthread right underwear leg Lower body dressing/undressing: 6: More than reasonable amount of time  FIM - Toileting Toileting steps completed by patient: Adjust clothing prior to toileting;Performs perineal hygiene;Adjust clothing after toileting Toileting Assistive Devices: Grab bar or rail for support Toileting: 5: Supervision: Safety issues/verbal cues  FIM - Radio producer Devices: Elevated toilet seat;Walker Toilet Transfers: 5-From toilet/BSC: Supervision (verbal cues/safety issues);5-To  toilet/BSC: Supervision (verbal cues/safety issues)  FIM - Bed/Chair Transfer Bed/Chair  Transfer Assistive Devices: HOB elevated;Bed rails Bed/Chair Transfer: 4: Bed > Chair or W/C: Min A (steadying Pt. > 75%);4: Supine > Sit: Min A (steadying Pt. > 75%/lift 1 leg)  FIM - Locomotion: Wheelchair Distance: 50 Locomotion: Wheelchair: 0: Activity did not occur FIM - Locomotion: Ambulation Locomotion: Ambulation Assistive Devices: Other (comment) (rollator) Ambulation/Gait Assistance: 5: Supervision Locomotion: Ambulation: 5: Travels 150 ft or more with supervision/safety issues  Comprehension Comprehension Mode: Auditory Comprehension: 7-Follows complex conversation/direction: With no assist  Expression Expression Mode: Verbal Expression: 7-Expresses complex ideas: With no assist  Social Interaction Social Interaction: 7-Interacts appropriately with others - No medications needed.  Problem Solving Problem Solving: 7-Solves complex problems: Recognizes & self-corrects  Memory Memory: 7-Complete Independence: No helper  Medical Problem List and Plan:  1. Deconditioning related to kidney disease and multiple medical issues  2. DVT Prophylaxis/Anticoagulation: Acute DVT of right saphenofemoral junction extending into the common femoral vein. Superficial thrombus right greater saphenous vein. Heparin to Coumadin therapy  3. Pain Management: Hydrocodone as needed. Monitor with increased mobility  4. Neuropsych: This patient is capable of making decisions on her own behalf.  5. Chronic kidney disease with a history of kidney transplant 9 years ago. Followup renal services. Continue CellCept 250 mg twice a day and Prograf 1 mg twice a day. Latest creatinine 3.44   -added abdominal binder for hernia/comfort 6. Chronic anemia/pancytopenia. hgb 8.2 (up)   - Aranesp weekly.   -Monitor platelet counts currently 91,000 related to her CellCept and Prograf.   -Followup per hematology services   7. Right rotator cuff tendonitis---ice, scapular/RTC exercises with PT/OT  -consider injection potentially as an outpt if pain persistent    LOS (Days) 7 A FACE TO FACE EVALUATION WAS PERFORMED  Meredith Staggers 06/27/2013 7:38 AM

## 2013-06-27 NOTE — Progress Notes (Signed)
Occupational Therapy Discharge Summary  Patient Details  Name: [name] MRN: [id number] Date of Birth: 21-May-1960  Today's Date: 06/27/2013 Time: 0900-1000 and 1330-1400 Time calculation (min): 60 min and 30 min   Patient has met 13 of 13 long term goals due to improved activity tolerance, improved balance, postural control, ability to compensate for deficits, improved awareness and improved coordination.  Patient to discharge at overall Modified Independent level.  Patient's care partner is independent to provide the necessary physical and cognitive assistance at discharge. Patient's significant other participated in family training/education on assisting with home setup. Emphasis on patient directing care as well.   Reasons goals not met: N/A. All LTGs met.   Recommendation:  Patient will benefit from ongoing skilled OT services in home health setting to continue to advance functional skills in the area of BADL, iADL and UE stregthening to increase independence is IADL and ADLs as well as decrease pain..  Equipment: BSC; SO installing grab bars and had shower chair PTA  Reasons for discharge: treatment goals met and discharge from hospital  Patient/family agrees with progress made and goals achieved: Yes  Skilled Therapeutic Intervention Session 1: Pt seen for ADL retraining with focus on sit<>stand, functional transfers, activity tolerance, and dynamic standing balance. Pt received sitting in w/c. Discussed plans for d/c and emphasis on directing care with SO. Practiced functional ambulation throughout room with use of walker bag to transfer items room<>bathroom. Pt completed all self-care tasks at Mod I level with more then reasonable amount of time secondary to rest breaks. Utilized LH sponge to assist with LB bathing and crossover technique for LB dressing. At end of session pt left sitting in w/c with RN present.   Session 2: Pt seen for 1:1 OT session with focus on patient  education, dynamic standing balance, and functional transfers. Practiced simulated walk-in shower transfer, having pt back up to 3" ledge before stepping over with RW. Completed 2x at [place] I level. Completed laundry task, discussing modification to complete in sitting for energy conservation and safety. Engaged dynamic standing balance task of making bed while standing with RW at Mod I level. Returned to room and discussed home setup, importance of directing care to SO, and anticipatory awareness with community settings. Pt with no questions or concerns at this time.   OT Discharge Precautions/Restrictions  Precautions Precautions: None Precaution Comments: Suppressed immune system precautions Restrictions Weight Bearing Restrictions: No General   Vital Signs Therapy Vitals Temp: 97.8 F (36.6 C) Temp src: Oral Pulse Rate: 91 Resp: 20 BP: 98/66 mmHg Oxygen Therapy SpO2: 99 % O2 Device: None (Room air) Pain Pain Assessment Pain Assessment: No/denies pain ADL   Vision/Perception  Vision- History Baseline Vision/History: Wears glasses Wears Glasses: Reading only Patient Visual Report: No change from baseline Vision- Assessment Vision Assessment?: No apparent visual deficits  Cognition Overall Cognitive Status: Within Functional Limits for tasks assessed Arousal/Alertness: Awake/alert Orientation Level: Oriented X4 Attention: Alternating;Selective Selective Attention: Appears intact Alternating Attention: Appears intact Memory: Appears intact Awareness: Appears intact Problem Solving: Appears intact Safety/Judgment: Appears intact Sensation Sensation Light Touch: Appears Intact Proprioception: Appears Intact Coordination Gross Motor Movements are Fluid and Coordinated: No Fine Motor Movements are Fluid and Coordinated: Yes Coordination and Movement Description: Limited due to pelvic pain/stiffness Motor  Motor Motor: Other (comment) Motor - Discharge  Observations: generalized weakness Mobility  Bed Mobility Bed Mobility: Supine to Sit;Sit to Supine Supine to Sit: 6: Modified independent (Device/Increase time);HOB flat Sit to Supine: 6: Modified independent (Device/Increase  time);HOB flat Transfers Sit to Stand: 6: Modified independent (Device/Increase time);With armrests;With upper extremity assist;From bed;From chair/3-in-1 Stand to Sit: 6: Modified independent (Device/Increase time);With upper extremity assist;With armrests;To bed;To chair/3-in-1  Trunk/Postural Assessment  Cervical Assessment Cervical Assessment: Within Functional Limits Thoracic Assessment Thoracic Assessment: Within Functional Limits Lumbar Assessment Lumbar Assessment: Within Functional Limits Postural Control Postural Control: Deficits on evaluation Postural Limitations: decreased pelvic mobility due to pain and stiffness  Balance   Extremity/Trunk Assessment RUE Assessment RUE Assessment: Within Functional Limits (4/5 strength; occasional pain in shoulder) LUE Assessment LUE Assessment: Within Functional Limits (4/5)  See FIM for current functional status  Rheanna Sergent N Korinna Tat 06/27/2013, 9:34 AM

## 2013-06-27 NOTE — Progress Notes (Signed)
Social Work Patient ID: [name], female   DOB: [birth year], 53 y.o.   MRN: [id number] Met with pt to inform of need for [facility] at discharge have switched referral to Home health instead of OP.  Referral made to [facility] to provide [facility] and HHPT. Pt set for discharge tomorrow.

## 2013-06-27 NOTE — Progress Notes (Signed)
NUTRITION FOLLOW UP  Intervention:   1.  Supplements; Continue Boost Plus supplement po TID, each supplement providing 360 kcals and 14 grams of protein  2.  Meals/snacks; Continue to provide snacks throughout the day. Continue to substitute soy milk for regular milk  3.  Brief education; provided.  Discussed home nutrition plan with pt.   Nutrition Dx:   Inadequate oral intake, ongoing  Monitor:   1.  Food/Beverage; pt meeting >/=90% estimated needs with tolerance. 2.  Wt/wt change; monitor trends  Assessment:   Pt admitted to rehab with weakness and diarrhea.  Pt known to this RD from previous assessments and work with pt during acute admission.  Pt with current interventions in place to support poor PO- supplements, soy milk, snacks, encouragement. Pt states she has significantly improved appetite and intake.  She states she has not had diarrhea in 4 days.  Pt with several supplements and snacks at bedside.  Discussed intake with pt as well as home nutrition plan.  RD to follow.   Height: Ht Readings from Last 1 Encounters:  06/20/13 5\' 3"  (1.6 m)    Weight Status:   Wt Readings from Last 1 Encounters:  06/26/13 106 lb 1.6 oz (48.127 kg)    Re-estimated needs:  Kcal: 1400-1600 Protein: 67-80g Fluid: >1.5 L/day  Skin: non-pitting edema  Diet Order: General   Intake/Output Summary (Last 24 hours) at 06/27/13 1440 Last data filed at 06/27/13 1300  Gross per 24 hour  Intake    960 ml  Output      0 ml  Net    960 ml    Last BM: 4/26   Labs:   Recent Labs Lab 06/21/13 0610 06/23/13 0545 06/24/13 0740  NA 138 137 136*  K 5.6* 6.3* 5.5*  CL 108 104 99  CO2 20 22 26   BUN 51* 53* 54*  CREATININE 3.38* 3.47* 3.36*  CALCIUM 8.0* 8.0* 8.6  GLUCOSE 83 79 103*    CBG (last 3)  No results found for this basename: GLUCAP,  in the last 72 hours  Scheduled Meds: . calcitRIOL  1 mcg Oral Daily  . calcium carbonate  2 tablet Oral TID  . cinacalcet  30 mg  Oral Q breakfast  . darbepoetin (ARANESP) injection - NON-DIALYSIS  200 mcg Subcutaneous Q Sat-1800  . lactose free nutrition  237 mL Oral TID WC  . magnesium oxide  400 mg Oral TID  . multivitamin with minerals  1 tablet Oral Daily  . mycophenolate  250 mg Oral BID  . sodium bicarbonate  1,300 mg Oral TID  . tacrolimus  1 mg Oral BID  . warfarin  2 mg Oral ONCE-1800  . Warfarin - Pharmacist Dosing Inpatient   Does not apply q1800    Continuous Infusions:   Brynda Greathouse, MS RD LDN Clinical Inpatient Dietitian Pager: [phone] Weekend/After hours pager: [phone]

## 2013-06-27 NOTE — Progress Notes (Signed)
Coumadin per pharmacy  Anticoagulation: new R dvt- on coumadin. INR 2.33 (dose charted) << 2.93<<2.98< 2.73 << 2.8<<2.28. Completed 5 day overlap w/ heparin on 4/24  Hgb 8.2 up, pltc ok, on 4/28, no bleeding, INR w/in goal on ~2mg  qday over last 6 days (2.5 x1).  INR goal 2-3  Plan -Warfarin 2 mg tonight - daily PT/INR - coumadin educated

## 2013-06-27 NOTE — Plan of Care (Addendum)
Problem: RH Ambulation Goal: LTG Patient will ambulate in community environment (PT) LTG: Patient will ambulate in community environment, # of feet with assistance (PT).  Outcome: Goal Met Goal met during second PT therapy session, see note for details.

## 2013-06-27 NOTE — Progress Notes (Signed)
Physical Therapy Session Note  Patient Details  Name: [name] MRN: [id number] Date of Birth: Jun 15, 1960  Today's Date: 06/27/2013 Time: 1520-1600 Time Calculation (min): 40 min  Short Term Goals: Week 1:  PT Short Term Goal 1 (Week 1): STGs=LTGs due to anticipated LOS  Skilled Therapeutic Interventions/Progress Updates:   Pt finishing up meeting with Nutritionist at start of session. Focused on community mobility with RW including gait on uneven surfaces and navigating obstacles and education/discussion on energy conservation techniques and safe community mobility. Pt overall S using RW.   Therapy Documentation Precautions:  Precautions Precautions: None Precaution Comments: Suppressed immune system precautions Restrictions Weight Bearing Restrictions: No  Pain: Reports minimal pain/stiffness in back and hip. Rest breaks as needed.  See FIM for current functional status  Therapy/Group: Individual Therapy  Lars Masson 06/27/2013, 4:09 PM

## 2013-06-28 DIAGNOSIS — R5381 Other malaise: Secondary | ICD-10-CM

## 2013-06-28 LAB — PROTIME-INR
INR: 1.98 — ABNORMAL HIGH (ref 0.00–1.49)
PROTHROMBIN TIME: 21.9 s — AB (ref 11.6–15.2)

## 2013-06-28 MED ORDER — WARFARIN SODIUM 3 MG PO TABS
3.0000 mg | ORAL_TABLET | Freq: Once | ORAL | Status: DC
  Filled 2013-06-28: qty 1

## 2013-06-28 MED ORDER — SODIUM BICARBONATE 650 MG PO TABS: 1300.0000 mg | ORAL_TABLET | Freq: Three times a day (TID) | ORAL | Status: AC

## 2013-06-28 MED ORDER — CALCITRIOL 0.5 MCG PO CAPS: 1.0000 ug | ORAL_CAPSULE | Freq: Every day | ORAL | Status: DC

## 2013-06-28 MED ORDER — HYDROCODONE-ACETAMINOPHEN 5-325 MG PO TABS: 1.0000 | ORAL_TABLET | ORAL | Status: DC | PRN

## 2013-06-28 MED ORDER — MAGNESIUM OXIDE 400 MG PO TABS: 400.0000 mg | ORAL_TABLET | Freq: Three times a day (TID) | ORAL | Status: DC

## 2013-06-28 MED ORDER — TACROLIMUS 1 MG PO CAPS: 1.0000 mg | ORAL_CAPSULE | Freq: Two times a day (BID) | ORAL | Status: DC

## 2013-06-28 MED ORDER — MYCOPHENOLATE MOFETIL 250 MG PO CAPS: 250.0000 mg | ORAL_CAPSULE | Freq: Two times a day (BID) | ORAL | Status: AC

## 2013-06-28 MED ORDER — WARFARIN SODIUM 2 MG PO TABS: 2.0000 mg | ORAL_TABLET | Freq: Once | ORAL | Status: DC

## 2013-06-28 MED ORDER — CINACALCET HCL 30 MG PO TABS
30.0000 mg | ORAL_TABLET | Freq: Every day | ORAL | Status: AC
Start: 2013-06-28 — End: ?

## 2013-06-28 NOTE — Progress Notes (Signed)
Social Work Discharge Note Discharge Note  The overall goal for the admission was met for:   Discharge location: Yes-HOME WITH SIGNIFICANT OTHER  Length of Stay: Yes-8 DAYS  Discharge activity level: Yes-MOD/I-SUPERVISION LEVEL  Home/community participation: Yes  Services provided included: MD, RD, PT, OT, RN, CM, Pharmacy and [facility]: Private Insurance: [employer]  Follow-up services arranged: Home Health: [facility]-PT,RN, DME: [facility] and Patient/Family has no preference for HH/DME agencies  Comments (or additional information):  Patient/Family verbalized understanding of follow-up arrangements: Yes  Individual responsible for coordination of the follow-up plan: SELF  Confirmed correct DME delivered: Elease Hashimoto 06/28/2013    Gardiner Rhyme Adana Marik

## 2013-06-28 NOTE — Progress Notes (Addendum)
ANTICOAGULATION CONSULT NOTE - Follow Up Consult  Pharmacy Consult for Coumadin Indication: DVT  Allergies  Allergen Reactions  . Cephalosporins Diarrhea and Nausea Only  . Erythromycin Diarrhea and Nausea Only  . Fexofenadine-Pseudoephed Er Other (See Comments)    dizzy    Patient Measurements: Height: 5\' 3"  (160 cm) Weight: 106 lb 1.6 oz (48.127 kg) IBW/kg (Calculated) : 52.4 Heparin Dosing Weight:   Vital Signs: Temp: 97.9 F (36.6 C) (05/01 0501) Temp src: Oral (05/01 0501) BP: 101/66 mmHg (05/01 0501) Pulse Rate: 93 (05/01 0501)  Labs:  Recent Labs  06/26/13 0450 06/27/13 0825 06/28/13 0714  LABPROT 29.5* 24.8* 21.9*  INR 2.93* 2.33* 1.98*    Estimated Creatinine Clearance: 14.5 ml/min (by C-G formula based on Cr of 3.36).   Medications:  Scheduled:  . calcitRIOL  1 mcg Oral Daily  . calcium carbonate  2 tablet Oral TID  . cinacalcet  30 mg Oral Q breakfast  . darbepoetin (ARANESP) injection - NON-DIALYSIS  200 mcg Subcutaneous Q Sat-1800  . lactose free nutrition  237 mL Oral TID WC  . magnesium oxide  400 mg Oral TID  . multivitamin with minerals  1 tablet Oral Daily  . mycophenolate  250 mg Oral BID  . sodium bicarbonate  1,300 mg Oral TID  . tacrolimus  1 mg Oral BID  . Warfarin - Pharmacist Dosing Inpatient   Does not apply q1800    Assessment: 53yo female with DVT, on Coumadin.  INR this AM down to 1.98- significantly decreased over last 3 days.  All doses are charted.  Pt has basically been on 2mg  daily and Heparin bridge was completed on 4/28.  No bleeding noted.  Goal of Therapy:  INR 2-3 Monitor platelets by anticoagulation protocol: Yes   Plan:  1-  Coumadin 3mg  today 2-  Consider resuming Heparin if INR decrease further 3-  F/U in AM  Gracy Bruins, PharmD [facility]

## 2013-06-28 NOTE — Progress Notes (Signed)
Social Work Patient ID: [name], female   DOB: [birth year], 53 y.o.   MRN: [id number] Met with pt to discuss rental wheelchair and or transport chair.  She will follow up with obtaining a transport chair from [employer] or on-line.

## 2013-06-28 NOTE — Progress Notes (Signed)
[place] PHYSICAL MEDICINE & REHABILITATION     PROGRESS NOTE    Subjective/Complaints: No new problems. Slept well. Sometimes a little tricky getting to [facility] at night.   12 point review of systems has been performed and if not noted above is otherwise negative.    Objective: Vital Signs: Blood pressure 101/66, pulse 93, temperature 97.9 F (36.6 C), temperature source Oral, resp. rate 18, height 5\' 3"  (1.6 m), weight 48.127 kg (106 lb 1.6 oz), SpO2 99.00%. No results found. No results found for this basename: WBC, HGB, HCT, PLT,  in the last 72 hours No results found for this basename: NA, K, CL, CO, GLUCOSE, BUN, CREATININE, CALCIUM,  in the last 72 hours CBG (last 3)  No results found for this basename: GLUCAP,  in the last 72 hours  Wt Readings from Last 3 Encounters:  06/26/13 48.127 kg (106 lb 1.6 oz)  06/18/13 44.271 kg (97 lb 9.6 oz)  06/08/11 58.968 kg (130 lb)    Physical Exam:  Constitutional: She is oriented to person, place, and time. Frail appearing HENT: oral mucosa pink and moist Head: Normocephalic.  Eyes: EOM are normal.  Neck: Normal range of motion. Neck supple. No tracheal deviation present. No thyromegaly present.  Cardiovascular: Normal rate and regular rhythm.  Respiratory: Effort normal and breath sounds normal. No respiratory distress.  GI: Soft. Bowel sounds are normal. She exhibits no distension.  Neurological: She is alert and oriented to person, place, and time. Normal insight and awareness. No cranial nerve deficit. She exhibits normal muscle tone. Coordination normal.  UE 4- deltoid, 4 bicep and tricep, 4+HI, HF 3+, KE 4, ankles 4+. No sensory findings  Musc: right shoulder impingement testing +, ?less tender Skin: Skin is warm and dry.  Psychiatric: She has a normal mood and affect    Assessment/Plan: 1. Functional deficits secondary to deconditioning related to kidney transplant failure and rejection which require 3+ hours per day of  interdisciplinary therapy in a comprehensive inpatient rehab setting. Physiatrist is providing close team supervision and 24 hour management of active medical problems listed below. Physiatrist and rehab team continue to assess barriers to discharge/monitor patient progress toward functional and medical goals.  Home today. outpt follow up. ?[facility]?. See me in amonth. May want to rent a wheelchair  FIM: FIM - Bathing Bathing Steps Patient Completed: Chest;Right Arm;Left Arm;Abdomen;Front perineal area;Buttocks;Right upper leg;Left upper leg;Right lower leg (including foot);Left lower leg (including foot) Bathing: 6: More than reasonable amount of time (LH sponge)  FIM - Upper Body Dressing/Undressing Upper body dressing/undressing steps patient completed: Thread/unthread right sleeve of pullover shirt/dresss;Thread/unthread left sleeve of pullover shirt/dress;Put head through opening of pull over shirt/dress;Pull shirt over trunk;Thread/unthread right bra strap;Thread/unthread left bra strap;Hook/unhook bra Upper body dressing/undressing: 6: More than reasonable amount of time FIM - Lower Body Dressing/Undressing Lower body dressing/undressing steps patient completed: Pull underwear up/down;Fasten/unfasten pants;Pull pants up/down;Thread/unthread left underwear leg;Thread/unthread left pants leg;Thread/unthread right pants leg;Don/Doff left sock;Don/Doff right sock;Thread/unthread right underwear leg Lower body dressing/undressing: 6: More than reasonable amount of time  FIM - Toileting Toileting steps completed by patient: Adjust clothing prior to toileting;Performs perineal hygiene;Adjust clothing after toileting Toileting Assistive Devices: Grab bar or rail for support Toileting: 6: More than reasonable amount of time  FIM - Radio producer Devices: Elevated toilet seat;Walker Toilet Transfers: 6-More than reasonable amt of time;6-Assistive device:  No helper;6-From toilet/BSC  FIM - Control and instrumentation engineer Devices: HOB elevated;Bed rails Bed/Chair  Transfer: 6: Assistive device: no helper;6: More than reasonable amt of time;6: Supine > Sit: No assist;6: Sit > Supine: No assist;6: Bed > Chair or W/C: No assist;6: Chair or W/C > Bed: No assist  FIM - Locomotion: Wheelchair Distance: 50 Locomotion: Wheelchair: 0: Activity did not occur (patient is ambulatory) FIM - Locomotion: Ambulation Locomotion: Ambulation Assistive Devices: Administrator Ambulation/Gait Assistance: 6: Modified independent (Device/Increase time) Locomotion: Ambulation: 6: Travels 150 ft or more with assistive device/no helper  Comprehension Comprehension Mode: Auditory Comprehension: 7-Follows complex conversation/direction: With no assist  Expression Expression Mode: Verbal Expression: 7-Expresses complex ideas: With no assist  Social Interaction Social Interaction: 7-Interacts appropriately with others - No medications needed.  Problem Solving Problem Solving: 7-Solves complex problems: Recognizes & self-corrects  Memory Memory: 7-Complete Independence: No helper  Medical Problem List and Plan:  1. Deconditioning related to kidney disease and multiple medical issues  2. DVT Prophylaxis/Anticoagulation: Acute DVT of right saphenofemoral junction extending into the common femoral vein. Superficial thrombus right greater saphenous vein. Heparin to Coumadin therapy  3. Pain Management: Hydrocodone as needed. Monitor with increased mobility  4. Neuropsych: This patient is capable of making decisions on her own behalf.  5. Chronic kidney disease with a history of kidney transplant 9 years ago. Followup renal services. Continue CellCept 250 mg twice a day and Prograf 1 mg twice a day. Latest creatinine 3.44   -added abdominal binder for hernia/comfort 6. Chronic anemia/pancytopenia. hgb 8.2 (up)   - Aranesp weekly.   -Monitor  platelet counts currently 91,000 related to her CellCept and Prograf.   -Followup per hematology services  7. Right rotator cuff tendonitis---ice, scapular/RTC exercises with PT/OT  -consider injection potentially as an outpt if pain persistent    LOS (Days) 8 A FACE TO FACE EVALUATION WAS PERFORMED  Meredith Staggers 06/28/2013 7:58 AM

## 2013-06-28 NOTE — Progress Notes (Signed)
Pt. Got d/c instructions and follow up appointments.Pt. Ready to go home with family.

## 2013-06-28 NOTE — Discharge Instructions (Signed)
Inpatient Rehab Discharge Instructions  [name] Discharge date and time: No discharge date for patient encounter.   Activities/Precautions/ Functional Status: Activity: activity as tolerated Diet: regular diet Wound Care: none needed Functional status:  ___ No restrictions     ___ Walk up steps independently ___ 24/7 supervision/assistance   ___ Walk up steps with assistance ___ Intermittent supervision/assistance  ___ Bathe/dress independently ___ Walk with walker     ___ Bathe/dress with assistance ___ Walk Independently    ___ Shower independently _x__ Walk with assistance    ___ Shower with assistance ___ No alcohol     ___ Return to work/school ________  Special Instructions: Home health nurse to check INR on Tuesday, 07/02/2013 results to Dr. Graylon Gunning [phone] PACs number [phone]   COMMUNITY REFERRALS UPON DISCHARGE:    Home Health:   [facility] [id number] Date of last service:06/28/2013  Medical Equipment/Items Ordered:BSC  Agency/Supplier:[facility] [facility]   [phone]     My questions have been answered and I understand these instructions. I will adhere to these goals and the provided educational materials after my discharge from the hospital.  Patient/Caregiver Signature _______________________________ Date __________  Clinician Signature _______________________________________ Date __________  Please bring this form and your medication list with you to all your follow-up doctor's appointments.

## 2013-07-01 ENCOUNTER — Telehealth: Payer: Self-pay

## 2013-07-01 NOTE — Telephone Encounter (Signed)
[facility] pharmacy called regarding the patients medication [id number].  Store [phone].

## 2013-07-01 NOTE — Telephone Encounter (Signed)
[facility] needed a prior authorization on cellcept.  Verbal authorization given.  Patient can pick up rx.

## 2013-07-02 ENCOUNTER — Telehealth: Payer: Self-pay | Admitting: *Deleted

## 2013-07-02 ENCOUNTER — Ambulatory Visit: Payer: Medicare Other | Admitting: Physical Therapy

## 2013-07-02 ENCOUNTER — Telehealth: Payer: Self-pay | Admitting: Internal Medicine

## 2013-07-02 NOTE — Telephone Encounter (Signed)
S/W PATIENT AND GVE NP APPT PER PATIENT WILL CALL BACK AND WOULD NEED TO R/S TO NEXT WEEK DUE TO TRANSPORTATION.

## 2013-07-02 NOTE — Telephone Encounter (Signed)
Calling to get vo for [facility] OT eval and treat.  Order given.

## 2013-07-02 NOTE — Telephone Encounter (Signed)
C/D 07/02/13 for appt. 07/04/13

## 2013-07-02 NOTE — Consult Note (Signed)
NEUROCOGNITIVE [place]   [name] is a 53 year old, right-handed woman, who was referred for a neurocognitive status examination to evaluate her emotional state and mental status in the setting of physical deconditioning.  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  She was admitted on 06/12/13 with generalized weakness, pancytopenia, and recent weight loss.  She was found to have multiple complications of her medical conditions and was treated with blood transfusion and ultimately was referred to palliative care.    Emotional Functioning:  During the clinical interview, [name] described her mood as "very positive" and stated that although her mood was lower recently, she feels as though things are "coming together" and she can understand her rehabilitation goals.  [name] commented that she ultimately came to understand that her goals for rehabilitation and the facility's goals were incongruent.  Once she realized what their goals were, then suddenly she could understand the value in what they were asking her to do in her therapy and her mood drastically improved.  [name] denied symptoms of depression, including suicidal ideation and her total score on a self-report measure of symptoms of depression was not reflective of the presence of clinically significant depression at this time.  However, she endorsed multiple symptoms of anxiety and stated that she typically makes lists and develops plans and "over-organizes" to help reduce levels of anxiety.  Her anxiety seems to be triggered by her health (e.g. making sure everything is clean enough given her reduced immune system; anticipating times when she is less physically able to do things, etc.).  Time was spent exploring other coping strategies that she might use to  help calm her anxiety in the future.    Mental Status:  [name]' total score on an overall measure of mental status was not suggestive of the presence of significant cognitive impairment at this time (MMSE-2 brief = 15/16).  She lost one point for failing to freely recall 1 of 3 previously studied words.  Subjectively, she stated that she has noticed a connection between "lucid" cognitive functioning and eating well with balanced electrolytes.  However, she stated that since her health has been poor, she has noticed more and more cognitive issues, particularly related to word-finding.      Impressions and Recommendations:  [name]' overall neurocognitive profile was not suggestive of the presence of marked cognitive impairment at this time.  From an emotional standpoint, she seems to be experiencing significant anxious mood, but she is able to cope well with it through using planning and organization strategies.  Time was spent exploring other techniques, including communication techniques for use with her boyfriend and placement of reminder lists for times when her physical and cognitive health is worse.  [name] may benefit from participation in individual psychotherapy post-discharge in order to provide her with an opportunity for continued support.  She was agreeable to this and information on local providers should be included in her discharge paperwork.    DIAGNOSIS: Chronic kidney disease, stage IV Physical deconditioning Unspecified Anxiety disorder  [name], Psy.D.  Clinical Neuropsychologist

## 2013-07-03 ENCOUNTER — Encounter (HOSPITAL_COMMUNITY)
Admission: RE | Admit: 2013-07-03 | Discharge: 2013-07-03 | Disposition: A | Discharge status: Home / Self Care | Payer: Medicare Other | Source: Ambulatory Visit | Attending: Nephrology | Admitting: Nephrology

## 2013-07-03 VITALS — BP 104/69 | HR 92 | Temp 98.5°F | Resp 20

## 2013-07-03 DIAGNOSIS — T451X5A Adverse effect of antineoplastic and immunosuppressive drugs, initial encounter: Secondary | ICD-10-CM | POA: Insufficient documentation

## 2013-07-03 DIAGNOSIS — N184 Chronic kidney disease, stage 4 (severe): Secondary | ICD-10-CM | POA: Insufficient documentation

## 2013-07-03 DIAGNOSIS — D649 Anemia, unspecified: Secondary | ICD-10-CM | POA: Insufficient documentation

## 2013-07-03 DIAGNOSIS — Z5181 Encounter for therapeutic drug level monitoring: Secondary | ICD-10-CM | POA: Insufficient documentation

## 2013-07-03 LAB — IRON AND TIBC
IRON: 51 ug/dL (ref 42–135)
SATURATION RATIOS: 24 % (ref 20–55)
TIBC: 215 ug/dL — AB (ref 250–470)
UIBC: 164 ug/dL (ref 125–400)

## 2013-07-03 LAB — RENAL FUNCTION PANEL
ALBUMIN: 3.1 g/dL — AB (ref 3.5–5.2)
BUN: 74 mg/dL — ABNORMAL HIGH (ref 6–23)
CALCIUM: 9.3 mg/dL (ref 8.4–10.5)
CO2: 26 mEq/L (ref 19–32)
CREATININE: 3.45 mg/dL — AB (ref 0.50–1.10)
Chloride: 100 mEq/L (ref 96–112)
GFR, EST AFRICAN AMERICAN: 16 mL/min — AB (ref >90)
GFR, EST NON AFRICAN AMERICAN: 14 mL/min — AB (ref >90)
Glucose, Bld: 92 mg/dL (ref 70–99)
Potassium: 5.1 mEq/L (ref 3.7–5.3)
SODIUM: 139 meq/L (ref 137–147)

## 2013-07-03 LAB — MAGNESIUM: Magnesium: 3.2 mg/dL — ABNORMAL HIGH (ref 1.5–2.5)

## 2013-07-03 LAB — FERRITIN: FERRITIN: 486 ng/mL — AB (ref 10–291)

## 2013-07-03 LAB — POCT HEMOGLOBIN-HEMACUE: HEMOGLOBIN: 7.6 g/dL — AB (ref 12.0–15.0)

## 2013-07-03 MED ORDER — DARBEPOETIN ALFA-POLYSORBATE 300 MCG/0.6ML IJ SOLN
200.0000 ug | INTRAMUSCULAR | Status: DC
  Administered 2013-07-03: 200 ug via SUBCUTANEOUS

## 2013-07-03 MED ORDER — DARBEPOETIN ALFA-POLYSORBATE 200 MCG/0.4ML IJ SOLN
INTRAMUSCULAR | Status: AC
Start: 2013-07-03 — End: 2013-07-03
  Administered 2013-07-03: 200 ug via SUBCUTANEOUS
  Filled 2013-07-03: qty 0.4

## 2013-07-03 MED ORDER — EPOETIN ALFA 20000 UNIT/ML IJ SOLN: 50000.0000 [IU] | INTRAMUSCULAR | Status: DC

## 2013-07-04 ENCOUNTER — Other Ambulatory Visit: Payer: Medicare Other

## 2013-07-04 ENCOUNTER — Ambulatory Visit: Payer: Medicare Other

## 2013-07-04 ENCOUNTER — Other Ambulatory Visit: Payer: Self-pay | Admitting: Internal Medicine

## 2013-07-04 DIAGNOSIS — N184 Chronic kidney disease, stage 4 (severe): Secondary | ICD-10-CM

## 2013-07-04 LAB — PTH, INTACT AND CALCIUM
CALCIUM TOTAL (PTH): 9.3 mg/dL (ref 8.4–10.5)
PTH: 84.1 pg/mL — ABNORMAL HIGH (ref 14.0–72.0)

## 2013-07-04 LAB — VITAMIN D 25 HYDROXY (VIT D DEFICIENCY, FRACTURES): Vit D, 25-Hydroxy: 22 ng/mL — ABNORMAL LOW (ref 30–89)

## 2013-07-05 LAB — CYTOMEGALOVIRUS PCR, QUALITATIVE: Cytomegalovirus DNA: NOT DETECTED

## 2013-07-06 LAB — EPSTEIN BARR VRS(EBV DNA BY PCR): EBV DNA QN by PCR: <200 copies/mL

## 2013-07-07 LAB — BK VIRUS QUANT PCR, URINE: BK VIRUS DNA QN PCR: 1629 {copies}/mL — AB

## 2013-07-10 ENCOUNTER — Other Ambulatory Visit (HOSPITAL_COMMUNITY): Payer: Self-pay | Admitting: *Deleted

## 2013-07-10 ENCOUNTER — Encounter (HOSPITAL_COMMUNITY): Payer: Medicare Other

## 2013-07-10 DIAGNOSIS — N184 Chronic kidney disease, stage 4 (severe): Secondary | ICD-10-CM | POA: Diagnosis not present

## 2013-07-10 DIAGNOSIS — T451X5A Adverse effect of antineoplastic and immunosuppressive drugs, initial encounter: Secondary | ICD-10-CM

## 2013-07-10 DIAGNOSIS — D649 Anemia, unspecified: Secondary | ICD-10-CM | POA: Diagnosis not present

## 2013-07-10 DIAGNOSIS — T8610 Unspecified complication of kidney transplant: Secondary | ICD-10-CM | POA: Diagnosis not present

## 2013-07-10 DIAGNOSIS — R5381 Other malaise: Secondary | ICD-10-CM | POA: Diagnosis not present

## 2013-07-11 ENCOUNTER — Encounter (HOSPITAL_COMMUNITY)
Admission: RE | Admit: 2013-07-11 | Discharge: 2013-07-11 | Disposition: A | Discharge status: Home / Self Care | Payer: Medicare Other | Source: Ambulatory Visit | Attending: Nephrology | Admitting: Nephrology

## 2013-07-11 VITALS — BP 115/67 | HR 82 | Temp 98.3°F | Resp 20

## 2013-07-11 DIAGNOSIS — T451X5A Adverse effect of antineoplastic and immunosuppressive drugs, initial encounter: Secondary | ICD-10-CM

## 2013-07-11 MED ORDER — DARBEPOETIN ALFA-POLYSORBATE 200 MCG/0.4ML IJ SOLN
INTRAMUSCULAR | Status: AC
Start: 2013-07-11 — End: 2013-07-11
  Administered 2013-07-11: 200 ug via SUBCUTANEOUS
  Filled 2013-07-11: qty 0.4

## 2013-07-11 MED ORDER — DARBEPOETIN ALFA-POLYSORBATE 300 MCG/0.6ML IJ SOLN
200.0000 ug | INTRAMUSCULAR | Status: DC
  Filled 2013-07-11: qty 0.6

## 2013-07-16 ENCOUNTER — Ambulatory Visit: Payer: Medicare Other

## 2013-07-16 ENCOUNTER — Other Ambulatory Visit: Payer: Medicare Other

## 2013-07-17 ENCOUNTER — Telehealth: Payer: Self-pay

## 2013-07-17 NOTE — Telephone Encounter (Signed)
Vickii Penna a physical therapist with [facility] called to get verbal to continue therapy.  Verbal order given.

## 2013-07-18 ENCOUNTER — Encounter (HOSPITAL_COMMUNITY)
Admission: RE | Admit: 2013-07-18 | Discharge: 2013-07-18 | Disposition: A | Discharge status: Home / Self Care | Payer: Medicare Other | Source: Ambulatory Visit | Attending: Nephrology | Admitting: Nephrology

## 2013-07-18 VITALS — BP 122/76 | HR 76 | Temp 98.4°F | Resp 20

## 2013-07-18 DIAGNOSIS — T451X5A Adverse effect of antineoplastic and immunosuppressive drugs, initial encounter: Secondary | ICD-10-CM

## 2013-07-18 LAB — RENAL FUNCTION PANEL
Albumin: 3.5 g/dL (ref 3.5–5.2)
BUN: 87 mg/dL — ABNORMAL HIGH (ref 6–23)
CALCIUM: 10 mg/dL (ref 8.4–10.5)
CO2: 27 mEq/L (ref 19–32)
CREATININE: 3.69 mg/dL — AB (ref 0.50–1.10)
Chloride: 106 mEq/L (ref 96–112)
GFR calc Af Amer: 15 mL/min — ABNORMAL LOW (ref >90)
GFR, EST NON AFRICAN AMERICAN: 13 mL/min — AB (ref >90)
Glucose, Bld: 95 mg/dL (ref 70–99)
PHOSPHORUS: 1.7 mg/dL — AB (ref 2.3–4.6)
Potassium: 4.6 mEq/L (ref 3.7–5.3)
SODIUM: 144 meq/L (ref 137–147)

## 2013-07-18 LAB — POCT HEMOGLOBIN-HEMACUE: Hemoglobin: 8 g/dL — ABNORMAL LOW (ref 12.0–15.0)

## 2013-07-18 MED ORDER — DARBEPOETIN ALFA-POLYSORBATE 300 MCG/0.6ML IJ SOLN
200.0000 ug | INTRAMUSCULAR | Status: DC
  Administered 2013-07-18: 200 ug via SUBCUTANEOUS
  Filled 2013-07-18: qty 0.6

## 2013-07-18 MED ORDER — DARBEPOETIN ALFA-POLYSORBATE 200 MCG/0.4ML IJ SOLN
INTRAMUSCULAR | Status: AC
Start: 2013-07-18 — End: 2013-07-18
  Administered 2013-07-18: 200 ug via SUBCUTANEOUS
  Filled 2013-07-18: qty 0.4

## 2013-07-23 ENCOUNTER — Telehealth: Payer: Self-pay

## 2013-07-23 NOTE — Telephone Encounter (Signed)
[place] for more visits. Who wants to know about turning care over to pcp----the patient or heather?

## 2013-07-23 NOTE — Telephone Encounter (Signed)
[name] with [facility] called to get orders for more visits.  She would also like to know if we would like to turn care over to Dr Posey Pronto PCP

## 2013-07-24 ENCOUNTER — Encounter (HOSPITAL_COMMUNITY)
Admission: RE | Admit: 2013-07-24 | Discharge: 2013-07-24 | Disposition: A | Discharge status: Home / Self Care | Payer: Medicare Other | Source: Ambulatory Visit | Attending: Nephrology | Admitting: Nephrology

## 2013-07-24 VITALS — BP 119/72 | HR 83 | Temp 98.5°F | Resp 20

## 2013-07-24 DIAGNOSIS — T451X5A Adverse effect of antineoplastic and immunosuppressive drugs, initial encounter: Secondary | ICD-10-CM

## 2013-07-24 LAB — POCT HEMOGLOBIN-HEMACUE: Hemoglobin: 8.6 g/dL — ABNORMAL LOW (ref 12.0–15.0)

## 2013-07-24 MED ORDER — DARBEPOETIN ALFA-POLYSORBATE 200 MCG/0.4ML IJ SOLN
INTRAMUSCULAR | Status: AC
  Filled 2013-07-24: qty 0.4

## 2013-07-24 MED ORDER — DARBEPOETIN ALFA-POLYSORBATE 300 MCG/0.6ML IJ SOLN
200.0000 ug | INTRAMUSCULAR | Status: DC
  Administered 2013-07-24: 200 ug via SUBCUTANEOUS
  Filled 2013-07-24: qty 0.6

## 2013-07-24 NOTE — Telephone Encounter (Signed)
Attempted to contact Volin @ [facility]. Left a voicemail to return call to clinic.

## 2013-07-24 NOTE — Telephone Encounter (Signed)
FYI:: Heather returned call to clinic. Otilio Jefferson the verbal okay to extend home care per Dr. Naaman Plummer. Heather then stated that when she went to visit the patient after calling us on 5/26 the patient was doing much better, and Heather and pt's PCP feel she can be discharged. Patient has met goals and performs ADL's.

## 2013-07-30 ENCOUNTER — Telehealth: Payer: Self-pay

## 2013-07-30 DIAGNOSIS — R5381 Other malaise: Secondary | ICD-10-CM

## 2013-07-30 NOTE — Telephone Encounter (Signed)
Elie Confer a physical therapist with [facility] called to request order for out patient physical therapy at [facility].

## 2013-07-31 ENCOUNTER — Encounter (HOSPITAL_COMMUNITY)
Admission: RE | Admit: 2013-07-31 | Discharge: 2013-07-31 | Disposition: A | Discharge status: Home / Self Care | Payer: Medicare Other | Source: Ambulatory Visit | Attending: Nephrology | Admitting: Nephrology

## 2013-07-31 VITALS — BP 144/76 | HR 72 | Temp 98.6°F | Resp 20

## 2013-07-31 DIAGNOSIS — T451X5A Adverse effect of antineoplastic and immunosuppressive drugs, initial encounter: Secondary | ICD-10-CM | POA: Insufficient documentation

## 2013-07-31 DIAGNOSIS — T8610 Unspecified complication of kidney transplant: Secondary | ICD-10-CM | POA: Insufficient documentation

## 2013-07-31 LAB — RENAL FUNCTION PANEL
Albumin: 3.6 g/dL (ref 3.5–5.2)
BUN: 60 mg/dL — ABNORMAL HIGH (ref 6–23)
CALCIUM: 8.6 mg/dL (ref 8.4–10.5)
CO2: 24 mEq/L (ref 19–32)
CREATININE: 3.99 mg/dL — AB (ref 0.50–1.10)
Chloride: 107 mEq/L (ref 96–112)
GFR calc Af Amer: 14 mL/min — ABNORMAL LOW (ref >90)
GFR, EST NON AFRICAN AMERICAN: 12 mL/min — AB (ref >90)
GLUCOSE: 89 mg/dL (ref 70–99)
Phosphorus: 2.6 mg/dL (ref 2.3–4.6)
Potassium: 4.9 mEq/L (ref 3.7–5.3)
Sodium: 143 mEq/L (ref 137–147)

## 2013-07-31 LAB — POCT HEMOGLOBIN-HEMACUE: HEMOGLOBIN: 9.8 g/dL — AB (ref 12.0–15.0)

## 2013-07-31 LAB — PROTIME-INR
INR: 2.73 — ABNORMAL HIGH (ref 0.00–1.49)
Prothrombin Time: 28 seconds — ABNORMAL HIGH (ref 11.6–15.2)

## 2013-07-31 LAB — IRON AND TIBC
IRON: 63 ug/dL (ref 42–135)
Saturation Ratios: 25 % (ref 20–55)
TIBC: 253 ug/dL (ref 250–470)
UIBC: 190 ug/dL (ref 125–400)

## 2013-07-31 LAB — MAGNESIUM: MAGNESIUM: 3.3 mg/dL — AB (ref 1.5–2.5)

## 2013-07-31 LAB — FERRITIN: FERRITIN: 404 ng/mL — AB (ref 10–291)

## 2013-07-31 MED ORDER — DARBEPOETIN ALFA-POLYSORBATE 200 MCG/0.4ML IJ SOLN
INTRAMUSCULAR | Status: AC
  Filled 2013-07-31: qty 0.4

## 2013-07-31 MED ORDER — DARBEPOETIN ALFA-POLYSORBATE 300 MCG/0.6ML IJ SOLN
200.0000 ug | INTRAMUSCULAR | Status: DC
  Administered 2013-07-31: 200 ug via SUBCUTANEOUS
  Filled 2013-07-31: qty 0.6

## 2013-07-31 NOTE — Telephone Encounter (Signed)
done

## 2013-08-07 ENCOUNTER — Encounter (HOSPITAL_COMMUNITY)
Admission: RE | Admit: 2013-08-07 | Discharge: 2013-08-07 | Disposition: A | Discharge status: Home / Self Care | Payer: Medicare Other | Source: Ambulatory Visit | Attending: Nephrology | Admitting: Nephrology

## 2013-08-07 VITALS — BP 117/74 | HR 85 | Temp 98.7°F | Resp 20

## 2013-08-07 DIAGNOSIS — T451X5A Adverse effect of antineoplastic and immunosuppressive drugs, initial encounter: Secondary | ICD-10-CM

## 2013-08-07 LAB — PROTIME-INR
INR: 1.77 — AB (ref 0.00–1.49)
Prothrombin Time: 20.1 seconds — ABNORMAL HIGH (ref 11.6–15.2)

## 2013-08-07 LAB — POCT HEMOGLOBIN-HEMACUE: Hemoglobin: 9.7 g/dL — ABNORMAL LOW (ref 12.0–15.0)

## 2013-08-07 MED ORDER — DARBEPOETIN ALFA-POLYSORBATE 300 MCG/0.6ML IJ SOLN
200.0000 ug | INTRAMUSCULAR | Status: DC
  Administered 2013-08-07: 200 ug via SUBCUTANEOUS
  Filled 2013-08-07: qty 0.6

## 2013-08-07 MED ORDER — DARBEPOETIN ALFA-POLYSORBATE 200 MCG/0.4ML IJ SOLN
INTRAMUSCULAR | Status: AC
  Filled 2013-08-07: qty 0.4

## 2013-08-13 ENCOUNTER — Ambulatory Visit: Payer: Medicare Other | Admitting: Physical Therapy

## 2013-08-14 ENCOUNTER — Encounter (HOSPITAL_COMMUNITY)
Admission: RE | Admit: 2013-08-14 | Discharge: 2013-08-14 | Disposition: A | Discharge status: Home / Self Care | Payer: Medicare Other | Source: Ambulatory Visit | Attending: Nephrology | Admitting: Nephrology

## 2013-08-14 VITALS — BP 112/72 | HR 88 | Temp 98.8°F | Resp 20

## 2013-08-14 DIAGNOSIS — T451X5A Adverse effect of antineoplastic and immunosuppressive drugs, initial encounter: Secondary | ICD-10-CM

## 2013-08-14 LAB — RENAL FUNCTION PANEL
Albumin: 3.8 g/dL (ref 3.5–5.2)
BUN: 59 mg/dL — ABNORMAL HIGH (ref 6–23)
CHLORIDE: 109 meq/L (ref 96–112)
CO2: 20 mEq/L (ref 19–32)
Calcium: 8.5 mg/dL (ref 8.4–10.5)
Creatinine, Ser: 3.4 mg/dL — ABNORMAL HIGH (ref 0.50–1.10)
GFR calc Af Amer: 17 mL/min — ABNORMAL LOW (ref >90)
GFR calc non Af Amer: 14 mL/min — ABNORMAL LOW (ref >90)
Glucose, Bld: 97 mg/dL (ref 70–99)
POTASSIUM: 4.9 meq/L (ref 3.7–5.3)
Phosphorus: 4.2 mg/dL (ref 2.3–4.6)
Sodium: 146 mEq/L (ref 137–147)

## 2013-08-14 LAB — PROTIME-INR
INR: 2.16 — AB (ref 0.00–1.49)
PROTHROMBIN TIME: 23.4 s — AB (ref 11.6–15.2)

## 2013-08-14 LAB — POCT HEMOGLOBIN-HEMACUE: Hemoglobin: 10.1 g/dL — ABNORMAL LOW (ref 12.0–15.0)

## 2013-08-14 MED ORDER — DARBEPOETIN ALFA-POLYSORBATE 200 MCG/0.4ML IJ SOLN
INTRAMUSCULAR | Status: AC
  Filled 2013-08-14: qty 0.4

## 2013-08-14 MED ORDER — DARBEPOETIN ALFA-POLYSORBATE 300 MCG/0.6ML IJ SOLN
200.0000 ug | INTRAMUSCULAR | Status: DC
  Administered 2013-08-14: 200 ug via SUBCUTANEOUS
  Filled 2013-08-14: qty 0.6

## 2013-08-19 ENCOUNTER — Ambulatory Visit: Payer: Medicare Other | Attending: Physical Medicine & Rehabilitation | Admitting: Physical Therapy

## 2013-08-19 DIAGNOSIS — R5381 Other malaise: Secondary | ICD-10-CM | POA: Insufficient documentation

## 2013-08-19 DIAGNOSIS — IMO0001 Reserved for inherently not codable concepts without codable children: Secondary | ICD-10-CM | POA: Insufficient documentation

## 2013-08-19 DIAGNOSIS — M6281 Muscle weakness (generalized): Secondary | ICD-10-CM | POA: Insufficient documentation

## 2013-08-19 DIAGNOSIS — R262 Difficulty in walking, not elsewhere classified: Secondary | ICD-10-CM | POA: Insufficient documentation

## 2013-08-20 ENCOUNTER — Other Ambulatory Visit (HOSPITAL_COMMUNITY): Payer: Medicare Other

## 2013-08-20 ENCOUNTER — Encounter (HOSPITAL_COMMUNITY): Payer: Medicare Other

## 2013-08-20 ENCOUNTER — Ambulatory Visit: Payer: Medicare Other | Admitting: Vascular Surgery

## 2013-08-21 ENCOUNTER — Encounter: Payer: Medicare Other | Admitting: Physical Medicine & Rehabilitation

## 2013-08-21 ENCOUNTER — Encounter (HOSPITAL_COMMUNITY)
Admission: RE | Admit: 2013-08-21 | Discharge: 2013-08-21 | Disposition: A | Discharge status: Home / Self Care | Payer: Medicare Other | Source: Ambulatory Visit | Attending: Nephrology | Admitting: Nephrology

## 2013-08-21 LAB — PROTIME-INR
INR: 2.55 — ABNORMAL HIGH (ref 0.00–1.49)
PROTHROMBIN TIME: 27.4 s — AB (ref 11.6–15.2)

## 2013-08-21 LAB — POCT HEMOGLOBIN-HEMACUE: Hemoglobin: 10.3 g/dL — ABNORMAL LOW (ref 12.0–15.0)

## 2013-08-21 MED ORDER — DARBEPOETIN ALFA-POLYSORBATE 200 MCG/0.4ML IJ SOLN
INTRAMUSCULAR | Status: AC
  Filled 2013-08-21: qty 0.4

## 2013-08-21 MED ORDER — DARBEPOETIN ALFA-POLYSORBATE 300 MCG/0.6ML IJ SOLN
200.0000 ug | INTRAMUSCULAR | Status: DC
  Administered 2013-08-21: 200 ug via SUBCUTANEOUS
  Filled 2013-08-21: qty 0.6

## 2013-08-26 ENCOUNTER — Ambulatory Visit: Payer: Medicare Other | Admitting: Physical Therapy

## 2013-08-28 ENCOUNTER — Encounter (HOSPITAL_COMMUNITY): Payer: Medicare Other

## 2013-08-28 ENCOUNTER — Other Ambulatory Visit (HOSPITAL_COMMUNITY): Payer: Self-pay | Admitting: Family Medicine

## 2013-08-29 ENCOUNTER — Encounter (HOSPITAL_COMMUNITY)
Admission: RE | Admit: 2013-08-29 | Discharge: 2013-08-29 | Disposition: A | Discharge status: Home / Self Care | Payer: Medicare Other | Source: Ambulatory Visit | Attending: Nephrology | Admitting: Nephrology

## 2013-08-29 VITALS — BP 116/68 | HR 77 | Temp 98.2°F | Resp 20

## 2013-08-29 DIAGNOSIS — T451X5A Adverse effect of antineoplastic and immunosuppressive drugs, initial encounter: Secondary | ICD-10-CM | POA: Insufficient documentation

## 2013-08-29 DIAGNOSIS — T451X5D Adverse effect of antineoplastic and immunosuppressive drugs, subsequent encounter: Secondary | ICD-10-CM

## 2013-08-29 DIAGNOSIS — Z5189 Encounter for other specified aftercare: Secondary | ICD-10-CM | POA: Diagnosis not present

## 2013-08-29 LAB — RENAL FUNCTION PANEL
ANION GAP: 15 (ref 5–15)
Albumin: 3.9 g/dL (ref 3.5–5.2)
BUN: 55 mg/dL — ABNORMAL HIGH (ref 6–23)
CO2: 23 meq/L (ref 19–32)
Calcium: 8.5 mg/dL (ref 8.4–10.5)
Chloride: 101 mEq/L (ref 96–112)
Creatinine, Ser: 3.75 mg/dL — ABNORMAL HIGH (ref 0.50–1.10)
GFR, EST AFRICAN AMERICAN: 15 mL/min — AB (ref >90)
GFR, EST NON AFRICAN AMERICAN: 13 mL/min — AB (ref >90)
Glucose, Bld: 84 mg/dL (ref 70–99)
PHOSPHORUS: 2.7 mg/dL (ref 2.3–4.6)
POTASSIUM: 5.6 meq/L — AB (ref 3.7–5.3)
SODIUM: 139 meq/L (ref 137–147)

## 2013-08-29 LAB — IRON AND TIBC
Iron: 63 ug/dL (ref 42–135)
SATURATION RATIOS: 25 % (ref 20–55)
TIBC: 252 ug/dL (ref 250–470)
UIBC: 189 ug/dL (ref 125–400)

## 2013-08-29 LAB — PROTIME-INR
INR: 1.84 — ABNORMAL HIGH (ref 0.00–1.49)
Prothrombin Time: 21.3 seconds — ABNORMAL HIGH (ref 11.6–15.2)

## 2013-08-29 LAB — MAGNESIUM: Magnesium: 3.2 mg/dL — ABNORMAL HIGH (ref 1.5–2.5)

## 2013-08-29 LAB — POCT HEMOGLOBIN-HEMACUE: Hemoglobin: 10.5 g/dL — ABNORMAL LOW (ref 12.0–15.0)

## 2013-08-29 LAB — FERRITIN: Ferritin: 357 ng/mL — ABNORMAL HIGH (ref 10–291)

## 2013-08-29 MED ORDER — DARBEPOETIN ALFA-POLYSORBATE 200 MCG/0.4ML IJ SOLN
INTRAMUSCULAR | Status: AC
  Filled 2013-08-29: qty 0.4

## 2013-08-29 MED ORDER — DARBEPOETIN ALFA-POLYSORBATE 300 MCG/0.6ML IJ SOLN
200.0000 ug | INTRAMUSCULAR | Status: DC
  Administered 2013-08-29: 200 ug via SUBCUTANEOUS
  Filled 2013-08-29: qty 0.6

## 2013-09-02 ENCOUNTER — Ambulatory Visit: Payer: Medicare Other | Admitting: Physical Therapy

## 2013-09-03 ENCOUNTER — Ambulatory Visit: Payer: Medicare Other | Attending: Physical Medicine & Rehabilitation | Admitting: Physical Therapy

## 2013-09-03 DIAGNOSIS — R5381 Other malaise: Secondary | ICD-10-CM | POA: Insufficient documentation

## 2013-09-03 DIAGNOSIS — R262 Difficulty in walking, not elsewhere classified: Secondary | ICD-10-CM | POA: Insufficient documentation

## 2013-09-03 DIAGNOSIS — IMO0001 Reserved for inherently not codable concepts without codable children: Secondary | ICD-10-CM | POA: Insufficient documentation

## 2013-09-03 DIAGNOSIS — M6281 Muscle weakness (generalized): Secondary | ICD-10-CM | POA: Insufficient documentation

## 2013-09-04 ENCOUNTER — Encounter (HOSPITAL_COMMUNITY)
Admission: RE | Admit: 2013-09-04 | Discharge: 2013-09-04 | Disposition: A | Discharge status: Home / Self Care | Payer: Medicare Other | Source: Ambulatory Visit | Attending: Nephrology | Admitting: Nephrology

## 2013-09-04 DIAGNOSIS — Z5189 Encounter for other specified aftercare: Secondary | ICD-10-CM | POA: Diagnosis not present

## 2013-09-04 LAB — PROTIME-INR
INR: 1.98 — AB (ref 0.00–1.49)
Prothrombin Time: 22.5 seconds — ABNORMAL HIGH (ref 11.6–15.2)

## 2013-09-04 MED ORDER — DARBEPOETIN ALFA-POLYSORBATE 200 MCG/0.4ML IJ SOLN
INTRAMUSCULAR | Status: AC
  Administered 2013-09-04: 200 ug via SUBCUTANEOUS
  Filled 2013-09-04: qty 0.4

## 2013-09-04 MED ORDER — DARBEPOETIN ALFA-POLYSORBATE 200 MCG/0.4ML IJ SOLN: 200.0000 ug | INTRAMUSCULAR | Status: DC

## 2013-09-05 LAB — POCT HEMOGLOBIN-HEMACUE: HEMOGLOBIN: 10.5 g/dL — AB (ref 12.0–15.0)

## 2013-09-11 ENCOUNTER — Encounter (HOSPITAL_COMMUNITY)
Admission: RE | Admit: 2013-09-11 | Discharge: 2013-09-11 | Disposition: A | Discharge status: Home / Self Care | Payer: Medicare Other | Source: Ambulatory Visit | Attending: Nephrology | Admitting: Nephrology

## 2013-09-11 DIAGNOSIS — Z5189 Encounter for other specified aftercare: Secondary | ICD-10-CM | POA: Diagnosis not present

## 2013-09-11 LAB — RENAL FUNCTION PANEL
Albumin: 3.6 g/dL (ref 3.5–5.2)
Anion gap: 16 — ABNORMAL HIGH (ref 5–15)
BUN: 55 mg/dL — ABNORMAL HIGH (ref 6–23)
CALCIUM: 8.5 mg/dL (ref 8.4–10.5)
CHLORIDE: 109 meq/L (ref 96–112)
CO2: 19 mEq/L (ref 19–32)
Creatinine, Ser: 3.35 mg/dL — ABNORMAL HIGH (ref 0.50–1.10)
GFR calc Af Amer: 17 mL/min — ABNORMAL LOW (ref >90)
GFR, EST NON AFRICAN AMERICAN: 15 mL/min — AB (ref >90)
Glucose, Bld: 97 mg/dL (ref 70–99)
Phosphorus: 3.7 mg/dL (ref 2.3–4.6)
Potassium: 4.3 mEq/L (ref 3.7–5.3)
Sodium: 144 mEq/L (ref 137–147)

## 2013-09-11 LAB — POCT HEMOGLOBIN-HEMACUE: Hemoglobin: 10.7 g/dL — ABNORMAL LOW (ref 12.0–15.0)

## 2013-09-11 LAB — PROTIME-INR
INR: 1.93 — ABNORMAL HIGH (ref 0.00–1.49)
PROTHROMBIN TIME: 22.1 s — AB (ref 11.6–15.2)

## 2013-09-11 MED ORDER — DARBEPOETIN ALFA-POLYSORBATE 300 MCG/0.6ML IJ SOLN
200.0000 ug | INTRAMUSCULAR | Status: DC
  Administered 2013-09-11: 200 ug via SUBCUTANEOUS
  Filled 2013-09-11: qty 0.6

## 2013-09-11 MED ORDER — DARBEPOETIN ALFA-POLYSORBATE 200 MCG/0.4ML IJ SOLN
INTRAMUSCULAR | Status: AC
  Filled 2013-09-11: qty 0.4

## 2013-09-18 ENCOUNTER — Encounter (HOSPITAL_COMMUNITY): Payer: Medicare Other

## 2013-09-18 ENCOUNTER — Encounter (HOSPITAL_COMMUNITY)
Admission: RE | Admit: 2013-09-18 | Discharge: 2013-09-18 | Disposition: A | Discharge status: Home / Self Care | Payer: Medicare Other | Source: Ambulatory Visit | Attending: Nephrology | Admitting: Nephrology

## 2013-09-18 DIAGNOSIS — Z5189 Encounter for other specified aftercare: Secondary | ICD-10-CM | POA: Diagnosis not present

## 2013-09-18 LAB — RENAL FUNCTION PANEL
ANION GAP: 19 — AB (ref 5–15)
Albumin: 4.1 g/dL (ref 3.5–5.2)
BUN: 63 mg/dL — AB (ref 6–23)
CHLORIDE: 106 meq/L (ref 96–112)
CO2: 16 mEq/L — ABNORMAL LOW (ref 19–32)
Calcium: 8.4 mg/dL (ref 8.4–10.5)
Creatinine, Ser: 3.45 mg/dL — ABNORMAL HIGH (ref 0.50–1.10)
GFR calc Af Amer: 16 mL/min — ABNORMAL LOW (ref >90)
GFR calc non Af Amer: 14 mL/min — ABNORMAL LOW (ref >90)
Glucose, Bld: 91 mg/dL (ref 70–99)
Phosphorus: 4.4 mg/dL (ref 2.3–4.6)
Potassium: 4.9 mEq/L (ref 3.7–5.3)
SODIUM: 141 meq/L (ref 137–147)

## 2013-09-18 LAB — CBC WITH DIFFERENTIAL/PLATELET
BASOS ABS: 0 10*3/uL (ref 0.0–0.1)
BASOS PCT: 1 % (ref 0–1)
EOS ABS: 0.1 10*3/uL (ref 0.0–0.7)
Eosinophils Relative: 3 % (ref 0–5)
HCT: 39.4 % (ref 36.0–46.0)
Hemoglobin: 11.8 g/dL — ABNORMAL LOW (ref 12.0–15.0)
Lymphocytes Relative: 29 % (ref 12–46)
Lymphs Abs: 1.1 10*3/uL (ref 0.7–4.0)
MCH: 26.1 pg (ref 26.0–34.0)
MCHC: 29.9 g/dL — AB (ref 30.0–36.0)
MCV: 87.2 fL (ref 78.0–100.0)
Monocytes Absolute: 0.3 10*3/uL (ref 0.1–1.0)
Monocytes Relative: 7 % (ref 3–12)
Neutro Abs: 2.2 10*3/uL (ref 1.7–7.7)
Neutrophils Relative %: 60 % (ref 43–77)
Platelets: 280 10*3/uL (ref 150–400)
RBC: 4.52 MIL/uL (ref 3.87–5.11)
RDW: 15.1 % (ref 11.5–15.5)
WBC: 3.6 10*3/uL — ABNORMAL LOW (ref 4.0–10.5)

## 2013-09-18 LAB — VITAMIN D 25 HYDROXY (VIT D DEFICIENCY, FRACTURES): VIT D 25 HYDROXY: 40 ng/mL (ref 30–89)

## 2013-09-18 LAB — MAGNESIUM: MAGNESIUM: 2.6 mg/dL — AB (ref 1.5–2.5)

## 2013-09-18 LAB — PROTIME-INR
INR: 1.66 — AB (ref 0.00–1.49)
Prothrombin Time: 19.6 seconds — ABNORMAL HIGH (ref 11.6–15.2)

## 2013-09-18 MED ORDER — DARBEPOETIN ALFA-POLYSORBATE 200 MCG/0.4ML IJ SOLN
INTRAMUSCULAR | Status: AC
  Filled 2013-09-18: qty 0.4

## 2013-09-18 MED ORDER — DARBEPOETIN ALFA-POLYSORBATE 300 MCG/0.6ML IJ SOLN
200.0000 ug | INTRAMUSCULAR | Status: DC
  Administered 2013-09-18: 200 ug via SUBCUTANEOUS
  Filled 2013-09-18: qty 0.6

## 2013-09-19 LAB — PTH, INTACT AND CALCIUM
CALCIUM TOTAL (PTH): 8.4 mg/dL (ref 8.4–10.5)
PTH: 1216.8 pg/mL — ABNORMAL HIGH (ref 14.0–72.0)

## 2013-09-19 LAB — TACROLIMUS LEVEL: Tacrolimus (FK506) - LabCorp: 1.7 ng/mL

## 2013-09-20 LAB — CYTOMEGALOVIRUS PCR, QUALITATIVE: CYTOMEGALOVIRUS DNA: NOT DETECTED

## 2013-09-22 LAB — EPSTEIN BARR VRS(EBV DNA BY PCR): EBV DNA QN by PCR: <200 copies/mL

## 2013-09-25 ENCOUNTER — Encounter (HOSPITAL_COMMUNITY): Payer: Medicare Other

## 2013-09-25 LAB — BK VIRUS QUANT PCR, URINE: BK VIRUS DNA QN PCR: NOT DETECTED {copies}/mL

## 2013-09-30 ENCOUNTER — Encounter (HOSPITAL_COMMUNITY)
Admission: RE | Admit: 2013-09-30 | Discharge: 2013-09-30 | Disposition: A | Discharge status: Home / Self Care | Payer: Medicare Other | Source: Ambulatory Visit | Attending: Nephrology | Admitting: Nephrology

## 2013-09-30 DIAGNOSIS — Z5189 Encounter for other specified aftercare: Secondary | ICD-10-CM | POA: Insufficient documentation

## 2013-09-30 DIAGNOSIS — T451X5A Adverse effect of antineoplastic and immunosuppressive drugs, initial encounter: Secondary | ICD-10-CM | POA: Insufficient documentation

## 2013-09-30 LAB — IRON AND TIBC
Iron: 20 ug/dL — ABNORMAL LOW (ref 42–135)
SATURATION RATIOS: 9 % — AB (ref 20–55)
TIBC: 213 ug/dL — ABNORMAL LOW (ref 250–470)
UIBC: 193 ug/dL (ref 125–400)

## 2013-09-30 LAB — FERRITIN: Ferritin: 269 ng/mL (ref 10–291)

## 2013-09-30 LAB — POCT HEMOGLOBIN-HEMACUE: HEMOGLOBIN: 11.2 g/dL — AB (ref 12.0–15.0)

## 2013-09-30 LAB — PROTIME-INR
INR: 1.58 — ABNORMAL HIGH (ref 0.00–1.49)
Prothrombin Time: 18.9 seconds — ABNORMAL HIGH (ref 11.6–15.2)

## 2013-09-30 MED ORDER — DARBEPOETIN ALFA-POLYSORBATE 200 MCG/0.4ML IJ SOLN
INTRAMUSCULAR | Status: AC
  Filled 2013-09-30: qty 0.4

## 2013-09-30 MED ORDER — DARBEPOETIN ALFA-POLYSORBATE 300 MCG/0.6ML IJ SOLN
200.0000 ug | INTRAMUSCULAR | Status: DC
  Administered 2013-09-30: 200 ug via SUBCUTANEOUS
  Filled 2013-09-30: qty 0.6

## 2013-10-08 ENCOUNTER — Encounter (HOSPITAL_COMMUNITY)
Admission: RE | Admit: 2013-10-08 | Discharge: 2013-10-08 | Disposition: A | Discharge status: Home / Self Care | Payer: Medicare Other | Source: Ambulatory Visit | Attending: Nephrology | Admitting: Nephrology

## 2013-10-08 DIAGNOSIS — D631 Anemia in chronic kidney disease: Secondary | ICD-10-CM | POA: Insufficient documentation

## 2013-10-08 DIAGNOSIS — N184 Chronic kidney disease, stage 4 (severe): Secondary | ICD-10-CM | POA: Diagnosis not present

## 2013-10-08 DIAGNOSIS — Z940 Kidney transplant status: Secondary | ICD-10-CM | POA: Diagnosis not present

## 2013-10-08 DIAGNOSIS — I129 Hypertensive chronic kidney disease with stage 1 through stage 4 chronic kidney disease, or unspecified chronic kidney disease: Secondary | ICD-10-CM | POA: Diagnosis present

## 2013-10-08 DIAGNOSIS — N039 Chronic nephritic syndrome with unspecified morphologic changes: Principal | ICD-10-CM

## 2013-10-08 LAB — RENAL FUNCTION PANEL
ALBUMIN: 3.3 g/dL — AB (ref 3.5–5.2)
ANION GAP: 14 (ref 5–15)
BUN: 41 mg/dL — ABNORMAL HIGH (ref 6–23)
CHLORIDE: 109 meq/L (ref 96–112)
CO2: 19 mEq/L (ref 19–32)
Calcium: 8.2 mg/dL — ABNORMAL LOW (ref 8.4–10.5)
Creatinine, Ser: 3.71 mg/dL — ABNORMAL HIGH (ref 0.50–1.10)
GFR, EST AFRICAN AMERICAN: 15 mL/min — AB (ref >90)
GFR, EST NON AFRICAN AMERICAN: 13 mL/min — AB (ref >90)
Glucose, Bld: 94 mg/dL (ref 70–99)
PHOSPHORUS: 3.6 mg/dL (ref 2.3–4.6)
POTASSIUM: 4.8 meq/L (ref 3.7–5.3)
Sodium: 142 mEq/L (ref 137–147)

## 2013-10-08 LAB — PROTIME-INR
INR: 1.83 — ABNORMAL HIGH (ref 0.00–1.49)
PROTHROMBIN TIME: 21.2 s — AB (ref 11.6–15.2)

## 2013-10-08 LAB — POCT HEMOGLOBIN-HEMACUE: HEMOGLOBIN: 11.5 g/dL — AB (ref 12.0–15.0)

## 2013-10-08 MED ORDER — DARBEPOETIN ALFA-POLYSORBATE 200 MCG/0.4ML IJ SOLN
INTRAMUSCULAR | Status: AC
  Filled 2013-10-08: qty 0.4

## 2013-10-08 MED ORDER — DARBEPOETIN ALFA-POLYSORBATE 300 MCG/0.6ML IJ SOLN
200.0000 ug | INTRAMUSCULAR | Status: DC
  Administered 2013-10-08: 200 ug via SUBCUTANEOUS
  Filled 2013-10-08: qty 0.6

## 2013-10-10 LAB — TACROLIMUS LEVEL: Tacrolimus (FK506) - LabCorp: 1.7 ng/mL

## 2013-10-14 ENCOUNTER — Other Ambulatory Visit (HOSPITAL_COMMUNITY): Payer: Self-pay | Admitting: *Deleted

## 2013-10-14 ENCOUNTER — Other Ambulatory Visit (HOSPITAL_COMMUNITY): Payer: Self-pay | Admitting: Family Medicine

## 2013-10-15 ENCOUNTER — Encounter (HOSPITAL_COMMUNITY)
Admission: RE | Admit: 2013-10-15 | Discharge: 2013-10-15 | Disposition: A | Discharge status: Home / Self Care | Payer: Medicare Other | Source: Ambulatory Visit | Attending: Nephrology | Admitting: Nephrology

## 2013-10-15 DIAGNOSIS — Z5189 Encounter for other specified aftercare: Secondary | ICD-10-CM | POA: Diagnosis not present

## 2013-10-15 LAB — POCT HEMOGLOBIN-HEMACUE: HEMOGLOBIN: 11.6 g/dL — AB (ref 12.0–15.0)

## 2013-10-15 LAB — PROTIME-INR
INR: 1.76 — ABNORMAL HIGH (ref 0.00–1.49)
Prothrombin Time: 20.5 seconds — ABNORMAL HIGH (ref 11.6–15.2)

## 2013-10-15 MED ORDER — SODIUM CHLORIDE 0.9 % IV SOLN
510.0000 mg | INTRAVENOUS | Status: DC
  Administered 2013-10-15: 510 mg via INTRAVENOUS
  Filled 2013-10-15: qty 17

## 2013-10-15 MED ORDER — DARBEPOETIN ALFA-POLYSORBATE 200 MCG/0.4ML IJ SOLN
INTRAMUSCULAR | Status: AC
  Administered 2013-10-15: 200 ug via SUBCUTANEOUS
  Filled 2013-10-15: qty 0.4

## 2013-10-15 MED ORDER — DARBEPOETIN ALFA-POLYSORBATE 300 MCG/0.6ML IJ SOLN
200.0000 ug | INTRAMUSCULAR | Status: DC
  Administered 2013-10-15: 200 ug via SUBCUTANEOUS
  Filled 2013-10-15: qty 0.6

## 2013-10-16 LAB — PTH, INTACT AND CALCIUM
Calcium, Total (PTH): 8.7 mg/dL (ref 8.4–10.5)
PTH: 503.5 pg/mL — ABNORMAL HIGH (ref 14.0–72.0)

## 2013-10-22 ENCOUNTER — Encounter (HOSPITAL_COMMUNITY): Payer: Medicare Other

## 2013-10-23 ENCOUNTER — Encounter (HOSPITAL_COMMUNITY)
Admission: RE | Admit: 2013-10-23 | Discharge: 2013-10-23 | Disposition: A | Discharge status: Home / Self Care | Payer: Medicare Other | Source: Ambulatory Visit | Attending: Nephrology | Admitting: Nephrology

## 2013-10-23 DIAGNOSIS — Z5189 Encounter for other specified aftercare: Secondary | ICD-10-CM | POA: Diagnosis not present

## 2013-10-23 LAB — POCT HEMOGLOBIN-HEMACUE: HEMOGLOBIN: 12.4 g/dL (ref 12.0–15.0)

## 2013-10-23 MED ORDER — SODIUM CHLORIDE 0.9 % IV SOLN
510.0000 mg | INTRAVENOUS | Status: DC
  Administered 2013-10-23: 510 mg via INTRAVENOUS
  Filled 2013-10-23: qty 17

## 2013-11-06 ENCOUNTER — Encounter (HOSPITAL_COMMUNITY)
Admission: RE | Admit: 2013-11-06 | Discharge: 2013-11-06 | Disposition: A | Discharge status: Home / Self Care | Payer: Medicare Other | Source: Ambulatory Visit | Attending: Nephrology | Admitting: Nephrology

## 2013-11-06 DIAGNOSIS — Z5189 Encounter for other specified aftercare: Secondary | ICD-10-CM | POA: Diagnosis not present

## 2013-11-06 LAB — PROTIME-INR
INR: 1.62 — ABNORMAL HIGH (ref 0.00–1.49)
Prothrombin Time: 19.2 seconds — ABNORMAL HIGH (ref 11.6–15.2)

## 2013-11-06 LAB — IRON AND TIBC
IRON: 125 ug/dL (ref 42–135)
SATURATION RATIOS: 57 % — AB (ref 20–55)
TIBC: 219 ug/dL — AB (ref 250–470)
UIBC: 94 ug/dL — AB (ref 125–400)

## 2013-11-06 LAB — RENAL FUNCTION PANEL
ALBUMIN: 3.4 g/dL — AB (ref 3.5–5.2)
Anion gap: 14 (ref 5–15)
BUN: 61 mg/dL — AB (ref 6–23)
CALCIUM: 7.8 mg/dL — AB (ref 8.4–10.5)
CO2: 19 mEq/L (ref 19–32)
CREATININE: 4.02 mg/dL — AB (ref 0.50–1.10)
Chloride: 111 mEq/L (ref 96–112)
GFR calc Af Amer: 13 mL/min — ABNORMAL LOW (ref >90)
GFR calc non Af Amer: 12 mL/min — ABNORMAL LOW (ref >90)
GLUCOSE: 86 mg/dL (ref 70–99)
PHOSPHORUS: 3.3 mg/dL (ref 2.3–4.6)
Potassium: 4.8 mEq/L (ref 3.7–5.3)
Sodium: 144 mEq/L (ref 137–147)

## 2013-11-06 LAB — FERRITIN: Ferritin: 734 ng/mL — ABNORMAL HIGH (ref 10–291)

## 2013-11-06 LAB — POCT HEMOGLOBIN-HEMACUE: Hemoglobin: 12 g/dL (ref 12.0–15.0)

## 2013-11-06 MED ORDER — DARBEPOETIN ALFA-POLYSORBATE 300 MCG/0.6ML IJ SOLN
200.0000 ug | INTRAMUSCULAR | Status: DC
  Filled 2013-11-06: qty 0.6

## 2013-11-08 LAB — TACROLIMUS LEVEL: TACROLIMUS (FK506) - LABCORP: 2.6 ng/mL

## 2013-11-20 ENCOUNTER — Encounter (HOSPITAL_COMMUNITY): Payer: Medicare Other

## 2013-11-20 ENCOUNTER — Encounter (HOSPITAL_COMMUNITY)
Admission: RE | Admit: 2013-11-20 | Discharge: 2013-11-20 | Disposition: A | Discharge status: Home / Self Care | Payer: Medicare Other | Source: Ambulatory Visit | Attending: Nephrology | Admitting: Nephrology

## 2013-11-20 DIAGNOSIS — D631 Anemia in chronic kidney disease: Secondary | ICD-10-CM | POA: Diagnosis not present

## 2013-11-20 DIAGNOSIS — N184 Chronic kidney disease, stage 4 (severe): Secondary | ICD-10-CM | POA: Insufficient documentation

## 2013-11-20 DIAGNOSIS — Z940 Kidney transplant status: Secondary | ICD-10-CM | POA: Diagnosis not present

## 2013-11-20 DIAGNOSIS — N039 Chronic nephritic syndrome with unspecified morphologic changes: Secondary | ICD-10-CM | POA: Diagnosis present

## 2013-11-20 LAB — RENAL FUNCTION PANEL
Albumin: 3.6 g/dL (ref 3.5–5.2)
Anion gap: 14 (ref 5–15)
BUN: 77 mg/dL — ABNORMAL HIGH (ref 6–23)
CALCIUM: 8.5 mg/dL (ref 8.4–10.5)
CHLORIDE: 111 meq/L (ref 96–112)
CO2: 18 meq/L — AB (ref 19–32)
CREATININE: 4.01 mg/dL — AB (ref 0.50–1.10)
GFR calc Af Amer: 13 mL/min — ABNORMAL LOW (ref >90)
GFR, EST NON AFRICAN AMERICAN: 12 mL/min — AB (ref >90)
Glucose, Bld: 84 mg/dL (ref 70–99)
Phosphorus: 4.4 mg/dL (ref 2.3–4.6)
Potassium: 5 mEq/L (ref 3.7–5.3)
SODIUM: 143 meq/L (ref 137–147)

## 2013-11-20 LAB — PROTIME-INR
INR: 1.33 (ref 0.00–1.49)
PROTHROMBIN TIME: 16.5 s — AB (ref 11.6–15.2)

## 2013-11-20 LAB — POCT HEMOGLOBIN-HEMACUE: Hemoglobin: 11.7 g/dL — ABNORMAL LOW (ref 12.0–15.0)

## 2013-11-20 MED ORDER — DARBEPOETIN ALFA-POLYSORBATE 300 MCG/0.6ML IJ SOLN
200.0000 ug | INTRAMUSCULAR | Status: DC
  Administered 2013-11-20: 200 ug via SUBCUTANEOUS
  Filled 2013-11-20: qty 0.6

## 2013-11-20 MED ORDER — DARBEPOETIN ALFA-POLYSORBATE 200 MCG/0.4ML IJ SOLN
INTRAMUSCULAR | Status: AC
  Filled 2013-11-20: qty 0.4

## 2013-11-28 ENCOUNTER — Encounter (HOSPITAL_COMMUNITY)
Admission: RE | Admit: 2013-11-28 | Discharge: 2013-11-28 | Disposition: A | Discharge status: Home / Self Care | Payer: Medicare Other | Source: Ambulatory Visit | Attending: Nephrology | Admitting: Nephrology

## 2013-11-28 DIAGNOSIS — N184 Chronic kidney disease, stage 4 (severe): Secondary | ICD-10-CM | POA: Diagnosis not present

## 2013-11-28 DIAGNOSIS — Z940 Kidney transplant status: Secondary | ICD-10-CM | POA: Insufficient documentation

## 2013-11-28 DIAGNOSIS — D631 Anemia in chronic kidney disease: Secondary | ICD-10-CM | POA: Insufficient documentation

## 2013-11-28 LAB — POCT HEMOGLOBIN-HEMACUE: Hemoglobin: 11 g/dL — ABNORMAL LOW (ref 12.0–15.0)

## 2013-11-28 LAB — PROTIME-INR
INR: 1.39 (ref 0.00–1.49)
Prothrombin Time: 17.1 seconds — ABNORMAL HIGH (ref 11.6–15.2)

## 2013-11-28 MED ORDER — DARBEPOETIN ALFA-POLYSORBATE 200 MCG/0.4ML IJ SOLN
INTRAMUSCULAR | Status: AC
  Administered 2013-11-28: 200 ug
  Filled 2013-11-28: qty 0.4

## 2013-11-28 MED ORDER — DARBEPOETIN ALFA-POLYSORBATE 300 MCG/0.6ML IJ SOLN: 200.0000 ug | INTRAMUSCULAR | Status: DC

## 2013-11-30 LAB — TACROLIMUS LEVEL: Tacrolimus (FK506) - LabCorp: 2.9 ng/mL

## 2013-12-05 ENCOUNTER — Encounter (HOSPITAL_COMMUNITY)
Admission: RE | Admit: 2013-12-05 | Discharge: 2013-12-05 | Disposition: A | Discharge status: Home / Self Care | Payer: Medicare Other | Source: Ambulatory Visit | Attending: Nephrology | Admitting: Nephrology

## 2013-12-05 VITALS — BP 114/74 | HR 82 | Temp 99.1°F | Resp 20

## 2013-12-05 DIAGNOSIS — D631 Anemia in chronic kidney disease: Secondary | ICD-10-CM | POA: Diagnosis not present

## 2013-12-05 DIAGNOSIS — T451X5D Adverse effect of antineoplastic and immunosuppressive drugs, subsequent encounter: Secondary | ICD-10-CM

## 2013-12-05 LAB — RENAL FUNCTION PANEL
Albumin: 3.4 g/dL — ABNORMAL LOW (ref 3.5–5.2)
Anion gap: 16 — ABNORMAL HIGH (ref 5–15)
BUN: 61 mg/dL — ABNORMAL HIGH (ref 6–23)
CO2: 19 meq/L (ref 19–32)
Calcium: 8.1 mg/dL — ABNORMAL LOW (ref 8.4–10.5)
Chloride: 108 mEq/L (ref 96–112)
Creatinine, Ser: 4.32 mg/dL — ABNORMAL HIGH (ref 0.50–1.10)
GFR, EST AFRICAN AMERICAN: 12 mL/min — AB (ref >90)
GFR, EST NON AFRICAN AMERICAN: 11 mL/min — AB (ref >90)
Glucose, Bld: 97 mg/dL (ref 70–99)
Phosphorus: 5.1 mg/dL — ABNORMAL HIGH (ref 2.3–4.6)
Potassium: 4.5 mEq/L (ref 3.7–5.3)
SODIUM: 143 meq/L (ref 137–147)

## 2013-12-05 LAB — IRON AND TIBC
IRON: 58 ug/dL (ref 42–135)
Saturation Ratios: 29 % (ref 20–55)
TIBC: 199 ug/dL — ABNORMAL LOW (ref 250–470)
UIBC: 141 ug/dL (ref 125–400)

## 2013-12-05 LAB — POCT HEMOGLOBIN-HEMACUE: Hemoglobin: 11.2 g/dL — ABNORMAL LOW (ref 12.0–15.0)

## 2013-12-05 LAB — PROTIME-INR
INR: 1.55 — ABNORMAL HIGH (ref 0.00–1.49)
PROTHROMBIN TIME: 18.6 s — AB (ref 11.6–15.2)

## 2013-12-05 LAB — FERRITIN: FERRITIN: 653 ng/mL — AB (ref 10–291)

## 2013-12-05 MED ORDER — DARBEPOETIN ALFA-POLYSORBATE 300 MCG/0.6ML IJ SOLN
200.0000 ug | INTRAMUSCULAR | Status: DC
  Administered 2013-12-05: 200 ug via SUBCUTANEOUS
  Filled 2013-12-05: qty 0.6

## 2013-12-05 MED ORDER — DARBEPOETIN ALFA-POLYSORBATE 200 MCG/0.4ML IJ SOLN
INTRAMUSCULAR | Status: AC
  Filled 2013-12-05: qty 0.4

## 2013-12-06 LAB — PTH, INTACT AND CALCIUM
Calcium, Total (PTH): 8.1 mg/dL — ABNORMAL LOW (ref 8.4–10.5)
PTH: 1813 pg/mL — ABNORMAL HIGH (ref 14–64)

## 2013-12-08 LAB — TACROLIMUS LEVEL: Tacrolimus (FK506) - LabCorp: 5.3 ng/mL

## 2013-12-11 ENCOUNTER — Encounter (HOSPITAL_COMMUNITY)
Admission: RE | Admit: 2013-12-11 | Discharge: 2013-12-11 | Disposition: A | Discharge status: Home / Self Care | Payer: Medicare Other | Source: Ambulatory Visit | Attending: Nephrology | Admitting: Nephrology

## 2013-12-11 DIAGNOSIS — D631 Anemia in chronic kidney disease: Secondary | ICD-10-CM | POA: Diagnosis not present

## 2013-12-11 LAB — PROTIME-INR
INR: 2.04 — ABNORMAL HIGH (ref 0.00–1.49)
Prothrombin Time: 23.2 seconds — ABNORMAL HIGH (ref 11.6–15.2)

## 2013-12-11 LAB — POCT HEMOGLOBIN-HEMACUE: Hemoglobin: 11.3 g/dL — ABNORMAL LOW (ref 12.0–15.0)

## 2013-12-11 MED ORDER — DARBEPOETIN ALFA-POLYSORBATE 200 MCG/0.4ML IJ SOLN
INTRAMUSCULAR | Status: AC
  Filled 2013-12-11: qty 0.4

## 2013-12-11 MED ORDER — DARBEPOETIN ALFA-POLYSORBATE 300 MCG/0.6ML IJ SOLN
200.0000 ug | INTRAMUSCULAR | Status: DC
  Administered 2013-12-11: 200 ug via SUBCUTANEOUS
  Filled 2013-12-11: qty 0.6

## 2013-12-18 ENCOUNTER — Encounter (HOSPITAL_COMMUNITY)
Admission: RE | Admit: 2013-12-18 | Discharge: 2013-12-18 | Disposition: A | Discharge status: Home / Self Care | Payer: Medicare Other | Source: Ambulatory Visit | Attending: Nephrology | Admitting: Nephrology

## 2013-12-18 DIAGNOSIS — D631 Anemia in chronic kidney disease: Secondary | ICD-10-CM | POA: Diagnosis not present

## 2013-12-18 LAB — RENAL FUNCTION PANEL
ANION GAP: 14 (ref 5–15)
Albumin: 3.4 g/dL — ABNORMAL LOW (ref 3.5–5.2)
BUN: 58 mg/dL — AB (ref 6–23)
CO2: 18 meq/L — AB (ref 19–32)
Calcium: 7.9 mg/dL — ABNORMAL LOW (ref 8.4–10.5)
Chloride: 109 mEq/L (ref 96–112)
Creatinine, Ser: 4.34 mg/dL — ABNORMAL HIGH (ref 0.50–1.10)
GFR calc non Af Amer: 11 mL/min — ABNORMAL LOW (ref >90)
GFR, EST AFRICAN AMERICAN: 12 mL/min — AB (ref >90)
Glucose, Bld: 90 mg/dL (ref 70–99)
POTASSIUM: 4.8 meq/L (ref 3.7–5.3)
Phosphorus: 4.2 mg/dL (ref 2.3–4.6)
SODIUM: 141 meq/L (ref 137–147)

## 2013-12-18 LAB — PROTIME-INR
INR: 2.23 — ABNORMAL HIGH (ref 0.00–1.49)
Prothrombin Time: 24.9 seconds — ABNORMAL HIGH (ref 11.6–15.2)

## 2013-12-18 LAB — POCT HEMOGLOBIN-HEMACUE: Hemoglobin: 11.6 g/dL — ABNORMAL LOW (ref 12.0–15.0)

## 2013-12-18 MED ORDER — DARBEPOETIN ALFA-POLYSORBATE 200 MCG/0.4ML IJ SOLN
INTRAMUSCULAR | Status: AC
  Filled 2013-12-18: qty 0.4

## 2013-12-18 MED ORDER — DARBEPOETIN ALFA-POLYSORBATE 300 MCG/0.6ML IJ SOLN
200.0000 ug | INTRAMUSCULAR | Status: DC
  Administered 2013-12-18: 200 ug via SUBCUTANEOUS
  Filled 2013-12-18: qty 0.6

## 2013-12-26 ENCOUNTER — Encounter (HOSPITAL_COMMUNITY)
Admission: RE | Admit: 2013-12-26 | Discharge: 2013-12-26 | Disposition: A | Discharge status: Home / Self Care | Payer: Medicare Other | Source: Ambulatory Visit | Attending: Nephrology | Admitting: Nephrology

## 2013-12-26 DIAGNOSIS — D631 Anemia in chronic kidney disease: Secondary | ICD-10-CM | POA: Diagnosis not present

## 2013-12-26 LAB — PROTIME-INR
INR: 2.22 — ABNORMAL HIGH (ref 0.00–1.49)
Prothrombin Time: 24.8 seconds — ABNORMAL HIGH (ref 11.6–15.2)

## 2013-12-26 LAB — POCT HEMOGLOBIN-HEMACUE: Hemoglobin: 11.5 g/dL — ABNORMAL LOW (ref 12.0–15.0)

## 2013-12-26 MED ORDER — DARBEPOETIN ALFA-POLYSORBATE 200 MCG/0.4ML IJ SOLN
INTRAMUSCULAR | Status: AC
  Filled 2013-12-26: qty 0.4

## 2013-12-26 MED ORDER — DARBEPOETIN ALFA-POLYSORBATE 300 MCG/0.6ML IJ SOLN
200.0000 ug | INTRAMUSCULAR | Status: DC
  Administered 2013-12-26: 200 ug via SUBCUTANEOUS
  Filled 2013-12-26: qty 0.6

## 2014-01-01 ENCOUNTER — Encounter (HOSPITAL_COMMUNITY): Payer: Medicare Other

## 2014-01-08 ENCOUNTER — Encounter (HOSPITAL_COMMUNITY)
Admission: RE | Admit: 2014-01-08 | Discharge: 2014-01-08 | Disposition: A | Discharge status: Home / Self Care | Payer: Medicare Other | Source: Ambulatory Visit | Attending: Nephrology | Admitting: Nephrology

## 2014-01-08 ENCOUNTER — Encounter (HOSPITAL_COMMUNITY): Payer: Medicare Other

## 2014-01-08 DIAGNOSIS — D631 Anemia in chronic kidney disease: Secondary | ICD-10-CM | POA: Diagnosis present

## 2014-01-08 DIAGNOSIS — Z940 Kidney transplant status: Secondary | ICD-10-CM | POA: Diagnosis not present

## 2014-01-08 DIAGNOSIS — N184 Chronic kidney disease, stage 4 (severe): Secondary | ICD-10-CM | POA: Insufficient documentation

## 2014-01-08 LAB — RENAL FUNCTION PANEL
ALBUMIN: 3.4 g/dL — AB (ref 3.5–5.2)
Anion gap: 16 — ABNORMAL HIGH (ref 5–15)
BUN: 53 mg/dL — ABNORMAL HIGH (ref 6–23)
CALCIUM: 8.3 mg/dL — AB (ref 8.4–10.5)
CHLORIDE: 110 meq/L (ref 96–112)
CO2: 19 mEq/L (ref 19–32)
CREATININE: 4.23 mg/dL — AB (ref 0.50–1.10)
GFR, EST AFRICAN AMERICAN: 13 mL/min — AB (ref >90)
GFR, EST NON AFRICAN AMERICAN: 11 mL/min — AB (ref >90)
Glucose, Bld: 92 mg/dL (ref 70–99)
Phosphorus: 4.3 mg/dL (ref 2.3–4.6)
Potassium: 5.2 mEq/L (ref 3.7–5.3)
SODIUM: 145 meq/L (ref 137–147)

## 2014-01-08 LAB — IRON AND TIBC
IRON: 52 ug/dL (ref 42–135)
Saturation Ratios: 27 % (ref 20–55)
TIBC: 192 ug/dL — ABNORMAL LOW (ref 250–470)
UIBC: 140 ug/dL (ref 125–400)

## 2014-01-08 LAB — FERRITIN: FERRITIN: 474 ng/mL — AB (ref 10–291)

## 2014-01-08 LAB — POCT HEMOGLOBIN-HEMACUE: HEMOGLOBIN: 12.5 g/dL (ref 12.0–15.0)

## 2014-01-08 LAB — PROTIME-INR
INR: 2.63 — AB (ref 0.00–1.49)
PROTHROMBIN TIME: 28.3 s — AB (ref 11.6–15.2)

## 2014-01-08 MED ORDER — DARBEPOETIN ALFA-POLYSORBATE 300 MCG/0.6ML IJ SOLN: 200.0000 ug | INTRAMUSCULAR | Status: DC

## 2014-01-28 ENCOUNTER — Encounter (HOSPITAL_COMMUNITY)
Admission: RE | Admit: 2014-01-28 | Discharge: 2014-01-28 | Disposition: A | Discharge status: Home / Self Care | Payer: Medicare Other | Source: Ambulatory Visit | Attending: Nephrology | Admitting: Nephrology

## 2014-01-28 DIAGNOSIS — Z940 Kidney transplant status: Secondary | ICD-10-CM | POA: Insufficient documentation

## 2014-01-28 DIAGNOSIS — D631 Anemia in chronic kidney disease: Secondary | ICD-10-CM | POA: Diagnosis not present

## 2014-01-28 DIAGNOSIS — N184 Chronic kidney disease, stage 4 (severe): Secondary | ICD-10-CM | POA: Diagnosis not present

## 2014-01-28 LAB — RENAL FUNCTION PANEL
ALBUMIN: 3.6 g/dL (ref 3.5–5.2)
Anion gap: 16 — ABNORMAL HIGH (ref 5–15)
BUN: 64 mg/dL — ABNORMAL HIGH (ref 6–23)
CO2: 17 mEq/L — ABNORMAL LOW (ref 19–32)
CREATININE: 4.24 mg/dL — AB (ref 0.50–1.10)
Calcium: 8.2 mg/dL — ABNORMAL LOW (ref 8.4–10.5)
Chloride: 110 mEq/L (ref 96–112)
GFR calc Af Amer: 13 mL/min — ABNORMAL LOW (ref >90)
GFR, EST NON AFRICAN AMERICAN: 11 mL/min — AB (ref >90)
Glucose, Bld: 92 mg/dL (ref 70–99)
Phosphorus: 4.7 mg/dL — ABNORMAL HIGH (ref 2.3–4.6)
Potassium: 4.7 mEq/L (ref 3.7–5.3)
Sodium: 143 mEq/L (ref 137–147)

## 2014-01-28 LAB — PROTIME-INR
INR: 2.03 — ABNORMAL HIGH (ref 0.00–1.49)
Prothrombin Time: 23.1 seconds — ABNORMAL HIGH (ref 11.6–15.2)

## 2014-01-28 LAB — POCT HEMOGLOBIN-HEMACUE: HEMOGLOBIN: 12 g/dL (ref 12.0–15.0)

## 2014-01-28 MED ORDER — DARBEPOETIN ALFA 200 MCG/ML IJ SOLN: 200.0000 ug | INTRAMUSCULAR | Status: DC

## 2014-01-29 LAB — PTH, INTACT AND CALCIUM
Calcium, Total (PTH): 7.9 mg/dL — ABNORMAL LOW (ref 8.4–10.5)
PTH: 2178 pg/mL — ABNORMAL HIGH (ref 14–64)

## 2014-01-30 LAB — TACROLIMUS LEVEL: Tacrolimus (FK506) - LabCorp: 4.2 ng/mL (ref 2.0–20.0)

## 2014-02-11 ENCOUNTER — Encounter (HOSPITAL_COMMUNITY)
Admission: RE | Admit: 2014-02-11 | Discharge: 2014-02-11 | Disposition: A | Discharge status: Home / Self Care | Payer: Medicare Other | Source: Ambulatory Visit | Attending: Nephrology | Admitting: Nephrology

## 2014-02-11 DIAGNOSIS — D631 Anemia in chronic kidney disease: Secondary | ICD-10-CM | POA: Diagnosis not present

## 2014-02-11 LAB — IRON AND TIBC
Iron: 192 ug/dL — ABNORMAL HIGH (ref 42–135)
UIBC: <15 ug/dL — ABNORMAL LOW (ref 125–400)

## 2014-02-11 LAB — RENAL FUNCTION PANEL
ANION GAP: 14 (ref 5–15)
Albumin: 3.4 g/dL — ABNORMAL LOW (ref 3.5–5.2)
BUN: 64 mg/dL — AB (ref 6–23)
CO2: 18 mEq/L — ABNORMAL LOW (ref 19–32)
Calcium: 8.3 mg/dL — ABNORMAL LOW (ref 8.4–10.5)
Chloride: 110 mEq/L (ref 96–112)
Creatinine, Ser: 4.22 mg/dL — ABNORMAL HIGH (ref 0.50–1.10)
GFR calc Af Amer: 13 mL/min — ABNORMAL LOW (ref >90)
GFR calc non Af Amer: 11 mL/min — ABNORMAL LOW (ref >90)
GLUCOSE: 94 mg/dL (ref 70–99)
PHOSPHORUS: 4.7 mg/dL — AB (ref 2.3–4.6)
Potassium: 4.6 mEq/L (ref 3.7–5.3)
Sodium: 142 mEq/L (ref 137–147)

## 2014-02-11 LAB — PROTIME-INR
INR: 1.56 — AB (ref 0.00–1.49)
Prothrombin Time: 18.8 seconds — ABNORMAL HIGH (ref 11.6–15.2)

## 2014-02-11 LAB — FERRITIN: Ferritin: 729 ng/mL — ABNORMAL HIGH (ref 10–291)

## 2014-02-11 LAB — POCT HEMOGLOBIN-HEMACUE: HEMOGLOBIN: 10.1 g/dL — AB (ref 12.0–15.0)

## 2014-02-11 MED ORDER — DARBEPOETIN ALFA 200 MCG/0.4ML IJ SOSY
PREFILLED_SYRINGE | INTRAMUSCULAR | Status: DC
Start: 2014-02-11 — End: 2014-02-12
  Filled 2014-02-11: qty 0.4

## 2014-02-11 MED ORDER — DARBEPOETIN ALFA-POLYSORBATE 300 MCG/0.6ML IJ SOLN
200.0000 ug | INTRAMUSCULAR | Status: DC
  Administered 2014-02-11: 200 ug via SUBCUTANEOUS

## 2014-02-14 ENCOUNTER — Other Ambulatory Visit (HOSPITAL_COMMUNITY): Payer: Self-pay

## 2014-02-17 ENCOUNTER — Encounter (HOSPITAL_COMMUNITY): Payer: Medicare Other

## 2014-02-24 ENCOUNTER — Inpatient Hospital Stay (HOSPITAL_COMMUNITY): Admission: RE | Admit: 2014-02-24 | Payer: Medicare Other | Source: Ambulatory Visit

## 2014-02-26 ENCOUNTER — Encounter (HOSPITAL_COMMUNITY)
Admission: RE | Admit: 2014-02-26 | Discharge: 2014-02-26 | Disposition: A | Discharge status: Home / Self Care | Payer: Medicare Other | Source: Ambulatory Visit | Attending: Nephrology | Admitting: Nephrology

## 2014-02-26 DIAGNOSIS — N184 Chronic kidney disease, stage 4 (severe): Secondary | ICD-10-CM

## 2014-02-26 DIAGNOSIS — D631 Anemia in chronic kidney disease: Secondary | ICD-10-CM | POA: Diagnosis not present

## 2014-02-26 LAB — RENAL FUNCTION PANEL
ALBUMIN: 3.7 g/dL (ref 3.5–5.2)
ANION GAP: 5 (ref 5–15)
BUN: 56 mg/dL — ABNORMAL HIGH (ref 6–23)
CO2: 23 mmol/L (ref 19–32)
Calcium: 8.2 mg/dL — ABNORMAL LOW (ref 8.4–10.5)
Chloride: 115 mEq/L — ABNORMAL HIGH (ref 96–112)
Creatinine, Ser: 4.83 mg/dL — ABNORMAL HIGH (ref 0.50–1.10)
GFR calc Af Amer: 11 mL/min — ABNORMAL LOW (ref >90)
GFR calc non Af Amer: 9 mL/min — ABNORMAL LOW (ref >90)
Glucose, Bld: 94 mg/dL (ref 70–99)
PHOSPHORUS: 4.1 mg/dL (ref 2.3–4.6)
POTASSIUM: 5.1 mmol/L (ref 3.5–5.1)
SODIUM: 143 mmol/L (ref 135–145)

## 2014-02-26 LAB — POCT HEMOGLOBIN-HEMACUE: Hemoglobin: 9.6 g/dL — ABNORMAL LOW (ref 12.0–15.0)

## 2014-02-26 MED ORDER — DARBEPOETIN ALFA 200 MCG/0.4ML IJ SOSY: 200.0000 ug | PREFILLED_SYRINGE | INTRAMUSCULAR | Status: DC

## 2014-02-26 MED ORDER — DARBEPOETIN ALFA 200 MCG/0.4ML IJ SOSY
PREFILLED_SYRINGE | INTRAMUSCULAR | Status: AC
  Filled 2014-02-26: qty 0.4

## 2014-02-26 MED ORDER — DARBEPOETIN ALFA-POLYSORBATE 300 MCG/0.6ML IJ SOLN
200.0000 ug | INTRAMUSCULAR | Status: DC
  Administered 2014-02-26: 200 ug via SUBCUTANEOUS

## 2014-02-27 LAB — PTH, INTACT AND CALCIUM
Calcium, Total (PTH): 8.1 mg/dL — ABNORMAL LOW (ref 8.4–10.5)
PTH: 1030 pg/mL — AB (ref 14–64)

## 2014-03-04 LAB — TACROLIMUS LEVEL: Tacrolimus (FK506) - LabCorp: 7.3 ng/mL

## 2014-03-06 ENCOUNTER — Encounter (HOSPITAL_COMMUNITY)
Admission: RE | Admit: 2014-03-06 | Discharge: 2014-03-06 | Disposition: A | Discharge status: Home / Self Care | Payer: Medicare Other | Source: Ambulatory Visit | Attending: Nephrology | Admitting: Nephrology

## 2014-03-06 DIAGNOSIS — D631 Anemia in chronic kidney disease: Secondary | ICD-10-CM | POA: Diagnosis not present

## 2014-03-06 DIAGNOSIS — N184 Chronic kidney disease, stage 4 (severe): Secondary | ICD-10-CM | POA: Insufficient documentation

## 2014-03-06 DIAGNOSIS — Z940 Kidney transplant status: Secondary | ICD-10-CM | POA: Insufficient documentation

## 2014-03-06 LAB — POCT HEMOGLOBIN-HEMACUE: HEMOGLOBIN: 10.2 g/dL — AB (ref 12.0–15.0)

## 2014-03-06 MED ORDER — DARBEPOETIN ALFA 200 MCG/0.4ML IJ SOSY
PREFILLED_SYRINGE | INTRAMUSCULAR | Status: AC
  Filled 2014-03-06: qty 0.4

## 2014-03-06 MED ORDER — DARBEPOETIN ALFA-POLYSORBATE 300 MCG/0.6ML IJ SOLN
200.0000 ug | INTRAMUSCULAR | Status: DC
  Administered 2014-03-06: 200 ug via SUBCUTANEOUS

## 2014-03-12 ENCOUNTER — Encounter (HOSPITAL_COMMUNITY): Payer: Medicare Other

## 2014-03-13 ENCOUNTER — Encounter (HOSPITAL_COMMUNITY)
Admission: RE | Admit: 2014-03-13 | Discharge: 2014-03-13 | Disposition: A | Discharge status: Home / Self Care | Payer: Medicare Other | Source: Ambulatory Visit | Attending: Nephrology | Admitting: Nephrology

## 2014-03-13 DIAGNOSIS — N184 Chronic kidney disease, stage 4 (severe): Secondary | ICD-10-CM

## 2014-03-13 DIAGNOSIS — D631 Anemia in chronic kidney disease: Secondary | ICD-10-CM | POA: Diagnosis not present

## 2014-03-13 LAB — RENAL FUNCTION PANEL
ALBUMIN: 3.5 g/dL (ref 3.5–5.2)
ANION GAP: 15 (ref 5–15)
BUN: 44 mg/dL — ABNORMAL HIGH (ref 6–23)
CO2: 23 mmol/L (ref 19–32)
Calcium: 8.4 mg/dL (ref 8.4–10.5)
Chloride: 105 mEq/L (ref 96–112)
Creatinine, Ser: 4.67 mg/dL — ABNORMAL HIGH (ref 0.50–1.10)
GFR, EST AFRICAN AMERICAN: 11 mL/min — AB (ref >90)
GFR, EST NON AFRICAN AMERICAN: 10 mL/min — AB (ref >90)
Glucose, Bld: 95 mg/dL (ref 70–99)
PHOSPHORUS: 4.4 mg/dL (ref 2.3–4.6)
POTASSIUM: 4.9 mmol/L (ref 3.5–5.1)
SODIUM: 143 mmol/L (ref 135–145)

## 2014-03-13 LAB — FERRITIN: Ferritin: 570 ng/mL — ABNORMAL HIGH (ref 10–291)

## 2014-03-13 LAB — IRON AND TIBC
Iron: 83 ug/dL (ref 42–145)
Saturation Ratios: 40 % (ref 20–55)
TIBC: 209 ug/dL — ABNORMAL LOW (ref 250–470)
UIBC: 126 ug/dL (ref 125–400)

## 2014-03-13 LAB — POCT HEMOGLOBIN-HEMACUE: HEMOGLOBIN: 10.1 g/dL — AB (ref 12.0–15.0)

## 2014-03-13 MED ORDER — DARBEPOETIN ALFA 200 MCG/0.4ML IJ SOSY
PREFILLED_SYRINGE | INTRAMUSCULAR | Status: AC
  Filled 2014-03-13: qty 0.4

## 2014-03-13 MED ORDER — DARBEPOETIN ALFA-POLYSORBATE 300 MCG/0.6ML IJ SOLN: 200.0000 ug | INTRAMUSCULAR | Status: DC

## 2014-03-13 MED ORDER — DARBEPOETIN ALFA 200 MCG/0.4ML IJ SOSY
200.0000 ug | PREFILLED_SYRINGE | INTRAMUSCULAR | Status: DC
  Administered 2014-03-13: 200 ug via SUBCUTANEOUS

## 2014-03-16 LAB — TACROLIMUS LEVEL: Tacrolimus (FK506) - LabCorp: 5 ng/mL (ref 2.0–20.0)

## 2014-03-20 ENCOUNTER — Encounter (HOSPITAL_COMMUNITY): Payer: Medicare Other

## 2014-03-25 ENCOUNTER — Encounter (HOSPITAL_COMMUNITY)
Admission: RE | Admit: 2014-03-25 | Discharge: 2014-03-25 | Disposition: A | Discharge status: Home / Self Care | Payer: Medicare Other | Source: Ambulatory Visit | Attending: Nephrology | Admitting: Nephrology

## 2014-03-25 DIAGNOSIS — D631 Anemia in chronic kidney disease: Secondary | ICD-10-CM | POA: Diagnosis not present

## 2014-03-25 DIAGNOSIS — N184 Chronic kidney disease, stage 4 (severe): Secondary | ICD-10-CM

## 2014-03-25 LAB — RENAL FUNCTION PANEL
ALBUMIN: 3.7 g/dL (ref 3.5–5.2)
ANION GAP: 9 (ref 5–15)
BUN: 47 mg/dL — ABNORMAL HIGH (ref 6–23)
CHLORIDE: 113 mmol/L — AB (ref 96–112)
CO2: 19 mmol/L (ref 19–32)
Calcium: 8.4 mg/dL (ref 8.4–10.5)
Creatinine, Ser: 4.68 mg/dL — ABNORMAL HIGH (ref 0.50–1.10)
GFR calc Af Amer: 11 mL/min — ABNORMAL LOW (ref >90)
GFR calc non Af Amer: 10 mL/min — ABNORMAL LOW (ref >90)
GLUCOSE: 107 mg/dL — AB (ref 70–99)
PHOSPHORUS: 4.7 mg/dL — AB (ref 2.3–4.6)
Potassium: 4.9 mmol/L (ref 3.5–5.1)
Sodium: 141 mmol/L (ref 135–145)

## 2014-03-25 LAB — POCT HEMOGLOBIN-HEMACUE: Hemoglobin: 10.9 g/dL — ABNORMAL LOW (ref 12.0–15.0)

## 2014-03-25 MED ORDER — DARBEPOETIN ALFA 200 MCG/0.4ML IJ SOSY
PREFILLED_SYRINGE | INTRAMUSCULAR | Status: AC
  Administered 2014-03-25: 200 ug via SUBCUTANEOUS
  Filled 2014-03-25: qty 0.4

## 2014-03-25 MED ORDER — DARBEPOETIN ALFA-POLYSORBATE 300 MCG/0.6ML IJ SOLN: 200.0000 ug | INTRAMUSCULAR | Status: DC

## 2014-03-26 LAB — PTH, INTACT AND CALCIUM
Calcium, Total (PTH): 8.2 mg/dL — ABNORMAL LOW (ref 8.7–10.2)
PTH: 770 pg/mL — AB (ref 15–65)

## 2014-04-02 ENCOUNTER — Encounter (HOSPITAL_COMMUNITY)
Admission: RE | Admit: 2014-04-02 | Discharge: 2014-04-02 | Disposition: A | Discharge status: Home / Self Care | Payer: Medicare Other | Source: Ambulatory Visit | Attending: Nephrology | Admitting: Nephrology

## 2014-04-02 DIAGNOSIS — Z940 Kidney transplant status: Secondary | ICD-10-CM | POA: Diagnosis not present

## 2014-04-02 DIAGNOSIS — D631 Anemia in chronic kidney disease: Secondary | ICD-10-CM | POA: Diagnosis not present

## 2014-04-02 DIAGNOSIS — N184 Chronic kidney disease, stage 4 (severe): Secondary | ICD-10-CM | POA: Insufficient documentation

## 2014-04-02 LAB — POCT HEMOGLOBIN-HEMACUE: HEMOGLOBIN: 11.5 g/dL — AB (ref 12.0–15.0)

## 2014-04-02 MED ORDER — DARBEPOETIN ALFA 200 MCG/0.4ML IJ SOSY: 200.0000 ug | PREFILLED_SYRINGE | Freq: Once | INTRAMUSCULAR | Status: DC

## 2014-04-02 MED ORDER — DARBEPOETIN ALFA-POLYSORBATE 300 MCG/0.6ML IJ SOLN
200.0000 ug | INTRAMUSCULAR | Status: DC
Start: 2014-04-02 — End: 2014-04-02
  Administered 2014-04-02: 200 ug via SUBCUTANEOUS

## 2014-04-02 MED ORDER — DARBEPOETIN ALFA 200 MCG/0.4ML IJ SOSY
PREFILLED_SYRINGE | INTRAMUSCULAR | Status: AC
  Filled 2014-04-02: qty 0.4

## 2014-04-04 ENCOUNTER — Other Ambulatory Visit: Payer: Self-pay | Admitting: *Deleted

## 2014-04-04 ENCOUNTER — Encounter: Payer: Self-pay | Admitting: Surgery

## 2014-04-04 DIAGNOSIS — N184 Chronic kidney disease, stage 4 (severe): Secondary | ICD-10-CM

## 2014-04-04 DIAGNOSIS — Z01810 Encounter for preprocedural cardiovascular examination: Secondary | ICD-10-CM

## 2014-04-09 ENCOUNTER — Encounter (HOSPITAL_COMMUNITY): Payer: Medicare Other

## 2014-04-16 ENCOUNTER — Encounter (HOSPITAL_COMMUNITY)
Admission: RE | Admit: 2014-04-16 | Discharge: 2014-04-16 | Disposition: A | Discharge status: Home / Self Care | Payer: Medicare Other | Source: Ambulatory Visit | Attending: Nephrology | Admitting: Nephrology

## 2014-04-16 DIAGNOSIS — D631 Anemia in chronic kidney disease: Secondary | ICD-10-CM | POA: Diagnosis not present

## 2014-04-16 DIAGNOSIS — N184 Chronic kidney disease, stage 4 (severe): Secondary | ICD-10-CM

## 2014-04-16 LAB — RENAL FUNCTION PANEL
ANION GAP: 9 (ref 5–15)
Albumin: 3.7 g/dL (ref 3.5–5.2)
BUN: 49 mg/dL — AB (ref 6–23)
CALCIUM: 8.3 mg/dL — AB (ref 8.4–10.5)
CO2: 18 mmol/L — ABNORMAL LOW (ref 19–32)
Chloride: 114 mmol/L — ABNORMAL HIGH (ref 96–112)
Creatinine, Ser: 4.86 mg/dL — ABNORMAL HIGH (ref 0.50–1.10)
GFR calc Af Amer: 11 mL/min — ABNORMAL LOW (ref >90)
GFR calc non Af Amer: 9 mL/min — ABNORMAL LOW (ref >90)
GLUCOSE: 95 mg/dL (ref 70–99)
PHOSPHORUS: 5.3 mg/dL — AB (ref 2.3–4.6)
Potassium: 4.9 mmol/L (ref 3.5–5.1)
Sodium: 141 mmol/L (ref 135–145)

## 2014-04-16 LAB — POCT HEMOGLOBIN-HEMACUE: Hemoglobin: 11.9 g/dL — ABNORMAL LOW (ref 12.0–15.0)

## 2014-04-16 MED ORDER — DARBEPOETIN ALFA-POLYSORBATE 300 MCG/0.6ML IJ SOLN: 200.0000 ug | INTRAMUSCULAR | Status: DC

## 2014-04-16 MED ORDER — DARBEPOETIN ALFA 200 MCG/0.4ML IJ SOSY: 200.0000 ug | PREFILLED_SYRINGE | INTRAMUSCULAR | Status: DC

## 2014-04-17 LAB — IRON AND TIBC
IRON: 53 ug/dL (ref 42–145)
SATURATION RATIOS: 26 % (ref 20–55)
TIBC: 206 ug/dL — AB (ref 250–470)
UIBC: 153 ug/dL (ref 125–400)

## 2014-04-17 LAB — FERRITIN: Ferritin: 511 ng/mL — ABNORMAL HIGH (ref 10–291)

## 2014-04-25 ENCOUNTER — Ambulatory Visit: Payer: Medicare Other | Admitting: Surgery

## 2014-04-25 ENCOUNTER — Encounter (HOSPITAL_COMMUNITY): Payer: Medicare Other

## 2014-04-25 ENCOUNTER — Other Ambulatory Visit (HOSPITAL_COMMUNITY): Payer: Medicare Other

## 2014-04-30 ENCOUNTER — Encounter (HOSPITAL_COMMUNITY)
Admission: RE | Admit: 2014-04-30 | Discharge: 2014-04-30 | Disposition: A | Discharge status: Home / Self Care | Payer: Medicare Other | Source: Ambulatory Visit | Attending: Nephrology | Admitting: Nephrology

## 2014-04-30 DIAGNOSIS — Z940 Kidney transplant status: Secondary | ICD-10-CM | POA: Diagnosis not present

## 2014-04-30 DIAGNOSIS — D631 Anemia in chronic kidney disease: Secondary | ICD-10-CM | POA: Diagnosis present

## 2014-04-30 DIAGNOSIS — N184 Chronic kidney disease, stage 4 (severe): Secondary | ICD-10-CM | POA: Insufficient documentation

## 2014-04-30 LAB — RENAL FUNCTION PANEL
ANION GAP: 5 (ref 5–15)
Albumin: 3.7 g/dL (ref 3.5–5.2)
BUN: 61 mg/dL — AB (ref 6–23)
CALCIUM: 8.3 mg/dL — AB (ref 8.4–10.5)
CO2: 24 mmol/L (ref 19–32)
Chloride: 113 mmol/L — ABNORMAL HIGH (ref 96–112)
Creatinine, Ser: 4.92 mg/dL — ABNORMAL HIGH (ref 0.50–1.10)
GFR, EST AFRICAN AMERICAN: 11 mL/min — AB (ref >90)
GFR, EST NON AFRICAN AMERICAN: 9 mL/min — AB (ref >90)
Glucose, Bld: 93 mg/dL (ref 70–99)
PHOSPHORUS: 5 mg/dL — AB (ref 2.3–4.6)
Potassium: 5.1 mmol/L (ref 3.5–5.1)
Sodium: 142 mmol/L (ref 135–145)

## 2014-04-30 LAB — POCT HEMOGLOBIN-HEMACUE: HEMOGLOBIN: 10.8 g/dL — AB (ref 12.0–15.0)

## 2014-04-30 MED ORDER — DARBEPOETIN ALFA 200 MCG/0.4ML IJ SOSY
PREFILLED_SYRINGE | INTRAMUSCULAR | Status: AC
  Administered 2014-04-30: 200 ug via SUBCUTANEOUS
  Filled 2014-04-30: qty 0.4

## 2014-04-30 MED ORDER — DARBEPOETIN ALFA 200 MCG/0.4ML IJ SOSY
200.0000 ug | PREFILLED_SYRINGE | INTRAMUSCULAR | Status: DC
  Administered 2014-04-30: 200 ug via SUBCUTANEOUS

## 2014-04-30 MED ORDER — DARBEPOETIN ALFA-POLYSORBATE 300 MCG/0.6ML IJ SOLN: 200.0000 ug | INTRAMUSCULAR | Status: DC

## 2014-05-01 LAB — PTH, INTACT AND CALCIUM
CALCIUM TOTAL (PTH): 8.1 mg/dL — AB (ref 8.7–10.2)
PTH: 929 pg/mL — ABNORMAL HIGH (ref 15–65)

## 2014-05-01 LAB — TACROLIMUS LEVEL: Tacrolimus (FK506) - LabCorp: 5 ng/mL (ref 2.0–20.0)

## 2014-05-07 ENCOUNTER — Encounter (HOSPITAL_COMMUNITY)
Admission: RE | Admit: 2014-05-07 | Discharge: 2014-05-07 | Disposition: A | Discharge status: Home / Self Care | Payer: Medicare Other | Source: Ambulatory Visit | Attending: Nephrology | Admitting: Nephrology

## 2014-05-07 DIAGNOSIS — D631 Anemia in chronic kidney disease: Secondary | ICD-10-CM | POA: Diagnosis not present

## 2014-05-07 DIAGNOSIS — N184 Chronic kidney disease, stage 4 (severe): Secondary | ICD-10-CM

## 2014-05-07 LAB — POCT HEMOGLOBIN-HEMACUE: Hemoglobin: 11.1 g/dL — ABNORMAL LOW (ref 12.0–15.0)

## 2014-05-07 MED ORDER — DARBEPOETIN ALFA 200 MCG/0.4ML IJ SOSY: 200.0000 ug | PREFILLED_SYRINGE | INTRAMUSCULAR | Status: DC

## 2014-05-07 MED ORDER — DARBEPOETIN ALFA-POLYSORBATE 300 MCG/0.6ML IJ SOLN
200.0000 ug | INTRAMUSCULAR | Status: DC
  Administered 2014-05-07: 200 ug via SUBCUTANEOUS

## 2014-05-08 MED ORDER — DARBEPOETIN ALFA 200 MCG/0.4ML IJ SOSY
PREFILLED_SYRINGE | INTRAMUSCULAR | Status: AC
  Filled 2014-05-08: qty 0.4

## 2014-05-14 ENCOUNTER — Encounter (HOSPITAL_COMMUNITY)
Admission: RE | Admit: 2014-05-14 | Discharge: 2014-05-14 | Disposition: A | Discharge status: Home / Self Care | Payer: Medicare Other | Source: Ambulatory Visit | Attending: Nephrology | Admitting: Nephrology

## 2014-05-14 DIAGNOSIS — D631 Anemia in chronic kidney disease: Secondary | ICD-10-CM | POA: Diagnosis not present

## 2014-05-14 DIAGNOSIS — N184 Chronic kidney disease, stage 4 (severe): Secondary | ICD-10-CM

## 2014-05-14 LAB — RENAL FUNCTION PANEL
ALBUMIN: 3.9 g/dL (ref 3.5–5.2)
ANION GAP: 10 (ref 5–15)
BUN: 59 mg/dL — ABNORMAL HIGH (ref 6–23)
CHLORIDE: 107 mmol/L (ref 96–112)
CO2: 22 mmol/L (ref 19–32)
Calcium: 9.2 mg/dL (ref 8.4–10.5)
Creatinine, Ser: 5.11 mg/dL — ABNORMAL HIGH (ref 0.50–1.10)
GFR, EST AFRICAN AMERICAN: 10 mL/min — AB (ref >90)
GFR, EST NON AFRICAN AMERICAN: 9 mL/min — AB (ref >90)
Glucose, Bld: 100 mg/dL — ABNORMAL HIGH (ref 70–99)
PHOSPHORUS: 4.2 mg/dL (ref 2.3–4.6)
POTASSIUM: 4.9 mmol/L (ref 3.5–5.1)
SODIUM: 139 mmol/L (ref 135–145)

## 2014-05-14 LAB — POCT HEMOGLOBIN-HEMACUE: Hemoglobin: 10.9 g/dL — ABNORMAL LOW (ref 12.0–15.0)

## 2014-05-14 MED ORDER — DARBEPOETIN ALFA-POLYSORBATE 300 MCG/0.6ML IJ SOLN
200.0000 ug | INTRAMUSCULAR | Status: DC
  Administered 2014-05-14: 200 ug via SUBCUTANEOUS

## 2014-05-15 LAB — IRON AND TIBC
IRON: 73 ug/dL (ref 42–145)
Saturation Ratios: 35 % (ref 20–55)
TIBC: 209 ug/dL — ABNORMAL LOW (ref 250–470)
UIBC: 136 ug/dL (ref 125–400)

## 2014-05-15 LAB — FERRITIN: Ferritin: 593 ng/mL — ABNORMAL HIGH (ref 10–291)

## 2014-05-15 MED ORDER — DARBEPOETIN ALFA 200 MCG/0.4ML IJ SOSY
PREFILLED_SYRINGE | INTRAMUSCULAR | Status: AC
  Filled 2014-05-15: qty 0.4

## 2014-05-21 ENCOUNTER — Encounter (HOSPITAL_COMMUNITY)
Admission: RE | Admit: 2014-05-21 | Discharge: 2014-05-21 | Disposition: A | Discharge status: Home / Self Care | Payer: Medicare Other | Source: Ambulatory Visit | Attending: Nephrology | Admitting: Nephrology

## 2014-05-21 DIAGNOSIS — D631 Anemia in chronic kidney disease: Secondary | ICD-10-CM | POA: Diagnosis not present

## 2014-05-21 DIAGNOSIS — N184 Chronic kidney disease, stage 4 (severe): Secondary | ICD-10-CM

## 2014-05-21 LAB — POCT HEMOGLOBIN-HEMACUE: HEMOGLOBIN: 10.8 g/dL — AB (ref 12.0–15.0)

## 2014-05-21 MED ORDER — DARBEPOETIN ALFA-POLYSORBATE 300 MCG/0.6ML IJ SOLN
200.0000 ug | INTRAMUSCULAR | Status: DC
  Administered 2014-05-21: 200 ug via SUBCUTANEOUS

## 2014-05-21 MED ORDER — DARBEPOETIN ALFA 200 MCG/0.4ML IJ SOSY: 200.0000 ug | PREFILLED_SYRINGE | INTRAMUSCULAR | Status: DC

## 2014-05-21 MED ORDER — DARBEPOETIN ALFA 200 MCG/0.4ML IJ SOSY
PREFILLED_SYRINGE | INTRAMUSCULAR | Status: AC
  Filled 2014-05-21: qty 0.4

## 2014-06-02 ENCOUNTER — Encounter (HOSPITAL_COMMUNITY)
Admission: RE | Admit: 2014-06-02 | Discharge: 2014-06-02 | Disposition: A | Discharge status: Home / Self Care | Payer: Medicare Other | Source: Ambulatory Visit | Attending: Nephrology | Admitting: Nephrology

## 2014-06-02 DIAGNOSIS — Z940 Kidney transplant status: Secondary | ICD-10-CM | POA: Diagnosis not present

## 2014-06-02 DIAGNOSIS — N184 Chronic kidney disease, stage 4 (severe): Secondary | ICD-10-CM | POA: Insufficient documentation

## 2014-06-02 DIAGNOSIS — D631 Anemia in chronic kidney disease: Secondary | ICD-10-CM | POA: Diagnosis not present

## 2014-06-02 LAB — RENAL FUNCTION PANEL
ANION GAP: 9 (ref 5–15)
Albumin: 3.9 g/dL (ref 3.5–5.2)
BUN: 55 mg/dL — ABNORMAL HIGH (ref 6–23)
CO2: 21 mmol/L (ref 19–32)
Calcium: 8.6 mg/dL (ref 8.4–10.5)
Chloride: 113 mmol/L — ABNORMAL HIGH (ref 96–112)
Creatinine, Ser: 5.11 mg/dL — ABNORMAL HIGH (ref 0.50–1.10)
GFR calc Af Amer: 10 mL/min — ABNORMAL LOW (ref >90)
GFR calc non Af Amer: 9 mL/min — ABNORMAL LOW (ref >90)
Glucose, Bld: 95 mg/dL (ref 70–99)
Phosphorus: 5.2 mg/dL — ABNORMAL HIGH (ref 2.3–4.6)
Potassium: 5 mmol/L (ref 3.5–5.1)
Sodium: 143 mmol/L (ref 135–145)

## 2014-06-02 LAB — POCT HEMOGLOBIN-HEMACUE: Hemoglobin: 12 g/dL (ref 12.0–15.0)

## 2014-06-02 MED ORDER — DARBEPOETIN ALFA-POLYSORBATE 300 MCG/0.6ML IJ SOLN: 200.0000 ug | INTRAMUSCULAR | Status: DC

## 2014-06-03 LAB — PTH, INTACT AND CALCIUM
CALCIUM TOTAL (PTH): 8.8 mg/dL (ref 8.7–10.2)
PTH: 147 pg/mL — ABNORMAL HIGH (ref 15–65)

## 2014-06-18 ENCOUNTER — Encounter: Payer: Self-pay | Admitting: Vascular Surgery

## 2014-06-19 ENCOUNTER — Ambulatory Visit (HOSPITAL_COMMUNITY)
Admission: RE | Admit: 2014-06-19 | Discharge: 2014-06-19 | Disposition: A | Discharge status: Home / Self Care | Payer: Medicare Other | Source: Ambulatory Visit | Attending: Vascular Surgery | Admitting: Vascular Surgery

## 2014-06-19 ENCOUNTER — Ambulatory Visit (INDEPENDENT_AMBULATORY_CARE_PROVIDER_SITE_OTHER): Payer: Medicare Other | Admitting: Vascular Surgery

## 2014-06-19 ENCOUNTER — Encounter: Payer: Self-pay | Admitting: Vascular Surgery

## 2014-06-19 ENCOUNTER — Ambulatory Visit (INDEPENDENT_AMBULATORY_CARE_PROVIDER_SITE_OTHER)
Admission: RE | Admit: 2014-06-19 | Discharge: 2014-06-19 | Disposition: A | Discharge status: Home / Self Care | Payer: Medicare Other | Source: Ambulatory Visit | Attending: Surgery | Admitting: Surgery

## 2014-06-19 VITALS — BP 152/92 | HR 76 | Ht 63.0 in | Wt 162.4 lb

## 2014-06-19 DIAGNOSIS — Z01810 Encounter for preprocedural cardiovascular examination: Secondary | ICD-10-CM

## 2014-06-19 DIAGNOSIS — N184 Chronic kidney disease, stage 4 (severe): Secondary | ICD-10-CM | POA: Insufficient documentation

## 2014-06-19 NOTE — Progress Notes (Signed)
Brief History and Physical  History of Present Illness  [name] is a 54 y.o. female who presents today for consultation regarding hemodialysis access.  She was on peritoneal dialysis from 1993-2005.  She received a kidney transplant in 2005.  Her kidney function is slowly dwindling and her current CR is 4.88.  Dr. Posey Frank has sent her her for access creation.  Past medical history includes: CKD, anemia, thrombocytopenia, angina, TIA, and CVA.  She is on coumadin and immunosuppressive medications daily.    Past Medical History  Diagnosis Date  . Chronic kidney disease   . Anemia   . Stroke   . Hypertension   . Hypercalcemia   . Hypomagnesemia   . Complications of transplanted kidney   . Chronic kidney disease, stage IV (severe)   . Loss of weight   . Pancytopenia   . Adverse effect of immunosuppressive drug   . Diabetes mellitus without complication     Past Surgical History  Procedure Laterality Date  . Kidney transplant    . Peritoneal catheter insertion    . Ureter surgery      History   Social History  . Marital Status: Divorced    Spouse Name: N/A  . Number of Children: N/A  . Years of Education: N/A   Occupational History  . Not on file.   Social History Main Topics  . Smoking status: Never Smoker   . Smokeless tobacco: Never Used  . Alcohol Use: No  . Drug Use: No  . Sexual Activity: Not on file   Other Topics Concern  . Not on file   Social History Narrative    Family History  Problem Relation Age of Onset  . Cancer Mother   . Hyperlipidemia Father     Current Outpatient Prescriptions on File Prior to Visit  Medication Sig Dispense Refill  . acetaminophen (TYLENOL) 160 MG/5ML solution Take 20.3 mLs (650 mg total) by mouth every 8 (eight) hours as needed for mild pain. 120 mL 0  . calcitRIOL (ROCALTROL) 0.5 MCG capsule Take 2 capsules (1 mcg total) by mouth daily. 60 capsule 0  . calcium carbonate (HEALTHY MAMA TAME THE FLAME) 500 MG  chewable tablet Chew 2 tablets (400 mg of elemental calcium total) by mouth 3 (three) times daily. 180 tablet 0  . cinacalcet (SENSIPAR) 30 MG tablet Take 1 tablet (30 mg total) by mouth daily with breakfast. 60 tablet 0  . darbepoetin (ARANESP) 200 MCG/0.4ML SOLN injection Inject 0.4 mLs (200 mcg total) into the skin every Saturday at 6 PM. (Patient taking differently: Inject 200 mcg into the skin. As directed based on hemoglobin) 1.68 mL 0  . Multiple Vitamin (MULTI-VITAMINS) TABS Take 1 tablet by mouth daily.    . mycophenolate (CELLCEPT) 250 MG capsule Take 1 capsule (250 mg total) by mouth 2 (two) times daily. 60 capsule 0  . sodium bicarbonate 650 MG tablet Take 2 tablets (1,300 mg total) by mouth 3 (three) times daily. 180 tablet 0  . tacrolimus (PROGRAF) 1 MG capsule Take 1 capsule (1 mg total) by mouth 2 (two) times daily. 60 capsule 0  . HYDROcodone-acetaminophen (NORCO/VICODIN) 5-325 MG per tablet Take 1-2 tablets by mouth every 4 (four) hours as needed for moderate pain. (Patient not taking: Reported on 06/19/2014) 60 tablet 0  . magnesium oxide (MAG-OX) 400 MG tablet Take 1 tablet (400 mg total) by mouth 3 (three) times daily. (Patient not taking: Reported on 06/19/2014) 90 tablet 1  .  warfarin (COUMADIN) 2 MG tablet Take 5 mg by mouth one time only at 6 PM. Monday and Thursday take 7mg     . warfarin (COUMADIN) 5 MG tablet Take 5 mg by mouth daily. Daily except Monday and Thursday take 7mg  daily     No current facility-administered medications on file prior to visit.    Allergies  Allergen Reactions  . Cephalosporins Diarrhea and Nausea Only  . Erythromycin Diarrhea and Nausea Only  . Fexofenadine-Pseudoephed Er Other (See Comments)    dizzy  . Cephalexin   . Fexofenadine Other (See Comments)    dizziness    Review of Systems: As listed above, otherwise negative.  Physical Examination  Filed Vitals:   06/19/14 1412  BP: 152/92  Pulse: 76  Height: 5\' 3"  (1.6 m)  Weight:  162 lb 6.4 oz (73.664 kg)  SpO2: 100%    General: A&O x 3, WDWN Somulent  Pulmonary: Sym exp, good air movt, CTAB, no rales, rhonchi, & wheezing  Cardiac: RRR, Nl S1, S2, no Murmurs, rubs or gallops Gastrointestinal: soft, NTND  Musculoskeletal: M/S 5/5 throughout , Extremities without ischemic changes   Vascular palpable radial and brachial pulses bilaterally  Vein mapping shows acceptable left cephalic vein with arterial duplex showing acceptable brachial artery.  The right brachial artery has a very high bifurcations and would not be a good chose.    Medical Decision Making  [name] is a 54 y.o. female who presents with: ESRD post kidney transplant.  Plan would be for left BC fistula creation.    She is not currently interested in the surgery at this time and will follow up PRN.  We recommend she follow up with Dr. Posey Frank.    [name] discuss the surgery risk and benefits with the patient today.    [name] [facility] Office: [phone]   06/19/2014, 2:40 PM  History and exam findings as above. The patient currently is reluctant to have an access placed. She is also reluctant to start hemodialysis. I discussed with her that if she wishes to proceed with placement access would do a left brachiocephalic AV fistula. Risks benefits possible complications and procedure details were discussed with the patient today including but not limited to bleeding infection ischemic steal non-maturation of fistula. She will call us if she wishes to proceed within the next month. I told her that if she waits further than one month she would need reevaluation to make sure her exam findings have not changed.  [name] Hinds, MD [facility] Office: [phone] Pager: [phone]

## 2014-06-23 ENCOUNTER — Encounter (HOSPITAL_COMMUNITY)
Admission: RE | Admit: 2014-06-23 | Discharge: 2014-06-23 | Disposition: A | Discharge status: Home / Self Care | Payer: Medicare Other | Source: Ambulatory Visit | Attending: Nephrology | Admitting: Nephrology

## 2014-06-23 DIAGNOSIS — N184 Chronic kidney disease, stage 4 (severe): Secondary | ICD-10-CM

## 2014-06-23 DIAGNOSIS — D631 Anemia in chronic kidney disease: Secondary | ICD-10-CM | POA: Diagnosis not present

## 2014-06-23 LAB — POCT HEMOGLOBIN-HEMACUE: Hemoglobin: 10.8 g/dL — ABNORMAL LOW (ref 12.0–15.0)

## 2014-06-23 LAB — RENAL FUNCTION PANEL
Albumin: 3.4 g/dL — ABNORMAL LOW (ref 3.5–5.2)
Anion gap: 14 (ref 5–15)
BUN: 74 mg/dL — ABNORMAL HIGH (ref 6–23)
CO2: 17 mmol/L — ABNORMAL LOW (ref 19–32)
CREATININE: 6.91 mg/dL — AB (ref 0.50–1.10)
Calcium: 8.6 mg/dL (ref 8.4–10.5)
Chloride: 105 mmol/L (ref 96–112)
GFR calc Af Amer: 7 mL/min — ABNORMAL LOW (ref >90)
GFR, EST NON AFRICAN AMERICAN: 6 mL/min — AB (ref >90)
Glucose, Bld: 115 mg/dL — ABNORMAL HIGH (ref 70–99)
Phosphorus: 3.9 mg/dL (ref 2.3–4.6)
Potassium: 4.7 mmol/L (ref 3.5–5.1)
Sodium: 136 mmol/L (ref 135–145)

## 2014-06-23 LAB — IRON AND TIBC
IRON: 51 ug/dL (ref 42–145)
Saturation Ratios: 31 % (ref 20–55)
TIBC: 165 ug/dL — ABNORMAL LOW (ref 250–470)
UIBC: 114 ug/dL — ABNORMAL LOW (ref 125–400)

## 2014-06-23 LAB — FERRITIN: FERRITIN: 969 ng/mL — AB (ref 10–291)

## 2014-06-23 MED ORDER — DARBEPOETIN ALFA 300 MCG/0.6ML IJ SOSY
300.0000 ug | PREFILLED_SYRINGE | Freq: Once | INTRAMUSCULAR | Status: DC
  Filled 2014-06-23: qty 0.6

## 2014-06-23 MED ORDER — DARBEPOETIN ALFA-POLYSORBATE 300 MCG/0.6ML IJ SOLN: 200.0000 ug | INTRAMUSCULAR | Status: DC

## 2014-06-23 MED ORDER — DARBEPOETIN ALFA 200 MCG/0.4ML IJ SOSY
PREFILLED_SYRINGE | INTRAMUSCULAR | Status: AC
  Administered 2014-06-23: 200 ug via SUBCUTANEOUS
  Filled 2014-06-23: qty 0.4

## 2014-06-23 MED ORDER — DARBEPOETIN ALFA 200 MCG/0.4ML IJ SOSY: 200.0000 ug | PREFILLED_SYRINGE | Freq: Once | INTRAMUSCULAR | Status: DC

## 2014-06-25 LAB — TACROLIMUS LEVEL: TACROLIMUS (FK506) - LABCORP: 4.6 ng/mL (ref 2.0–20.0)

## 2014-06-30 ENCOUNTER — Encounter: Payer: Medicare Other | Attending: Nephrology | Admitting: *Deleted

## 2014-06-30 ENCOUNTER — Encounter: Payer: Self-pay | Admitting: *Deleted

## 2014-06-30 VITALS — Ht 61.75 in | Wt 160.4 lb

## 2014-06-30 DIAGNOSIS — N185 Chronic kidney disease, stage 5: Secondary | ICD-10-CM | POA: Diagnosis present

## 2014-06-30 DIAGNOSIS — Z713 Dietary counseling and surveillance: Secondary | ICD-10-CM | POA: Insufficient documentation

## 2014-06-30 NOTE — Patient Instructions (Addendum)
-  Ask your nephrologist about fluid restrictions (standard is about 1.2 L/day) -Ask your nephrologist about sodium/potassium/phosphorus or protein restrictions -Add in a third meal during the day  -Increase weight training/resistance training

## 2014-06-30 NOTE — Progress Notes (Signed)
  Medical Nutrition Therapy:  Appt start time: 1400 end time:  1500.   Assessment:  Primary concerns today: [name] would like nutrition advice and homeopathic ways to manage her health.  [name] has been on peritoneal dialysis for 13 years before receiving a kidney transplant in 2005 in [place], [place].  [name] is currently trying to be put on several different transplant lists in the area such as [place] and [place].  She lives with her boyfriend who does some of the food shopping and preparation.  [name] is concerned of weight gain of 60 lbs in one year, believes it may be fluid.  [name] claims she tries to stay hydrated and she has been told to consume about 64 oz of fluid daily.  She denies knowledge of needing fluid, protein, potassium, sodium, phosphorus restrictions.    Preferred Learning Style:   No preference indicated   Learning Readiness:   Ready  MEDICATIONS: see list   DIETARY INTAKE:  Usual eating pattern includes 2 meals and 1-2 snacks per day.  Avoided foods include grapefruit, pomegranate due to medications, fried foods.  24-hr recall:  B ( AM): oatmeal with flaxseed OR cream of wheat OR breakfast sandwhich and water Snk ( AM): orange  L ( PM): none Snk ( PM): handful of pecans D ( PM): flat bread sandwich OR salad with apples, cranberries Snk ( PM): none Beverages: la croix  Usual physical activity: eleptical/bike machine 40 min 4-5 days a week and sometimes aerobics classes  Estimated energy needs: 1800 calories 225 g carbohydrates 90 g protein 160 g fat  Progress Towards Goal(s):  In progress.   Nutritional Diagnosis:  NB-1.1 Food and nutrition-related knowledge deficit As related to renal diet reccomendations.  As evidenced by patient report and request for education.    Intervention:  Nutrition counseling provided.  Discussed nutrients that effect kidney function such as sodium, potassium and phosphorus and which foods contain high amounts of each.  Discussed  asking nephrologist for precise recommendations on these restricting these nutrients as well as fluid.  Goals: -Ask your nephrologist about fluid restrictions (standard is about 1.2 L/day) -Ask your nephrologist about sodium/potassium/phosphorus or protein restrictions -Add in a third meal during the day  -Increase weight training/resistance training  Teaching Method Utilized:   Auditory  Handouts given during visit include:  Cooking for [employer] for Renal Disease  Barriers to learning/adherence to lifestyle change: none  Demonstrated degree of understanding via:  Teach Back   Monitoring/Evaluation:  Dietary intake, exercise, pertinent lab data, and body weight prn.

## 2014-07-22 ENCOUNTER — Other Ambulatory Visit (HOSPITAL_COMMUNITY): Payer: Self-pay | Admitting: *Deleted

## 2014-07-23 ENCOUNTER — Encounter (HOSPITAL_COMMUNITY)
Admission: RE | Admit: 2014-07-23 | Discharge: 2014-07-23 | Disposition: A | Discharge status: Home / Self Care | Payer: Medicare Other | Source: Ambulatory Visit | Attending: Nephrology | Admitting: Nephrology

## 2014-07-23 DIAGNOSIS — D638 Anemia in other chronic diseases classified elsewhere: Secondary | ICD-10-CM | POA: Insufficient documentation

## 2014-07-23 DIAGNOSIS — N184 Chronic kidney disease, stage 4 (severe): Secondary | ICD-10-CM | POA: Insufficient documentation

## 2014-07-23 LAB — RENAL FUNCTION PANEL
ANION GAP: 10 (ref 5–15)
Albumin: 3.6 g/dL (ref 3.5–5.0)
BUN: 63 mg/dL — AB (ref 6–20)
CALCIUM: 8.5 mg/dL — AB (ref 8.9–10.3)
CO2: 21 mmol/L — AB (ref 22–32)
Chloride: 110 mmol/L (ref 101–111)
Creatinine, Ser: 5.58 mg/dL — ABNORMAL HIGH (ref 0.44–1.00)
GFR calc Af Amer: 9 mL/min — ABNORMAL LOW (ref >60)
GFR calc non Af Amer: 8 mL/min — ABNORMAL LOW (ref >60)
Glucose, Bld: 90 mg/dL (ref 65–99)
Phosphorus: 5.5 mg/dL — ABNORMAL HIGH (ref 2.5–4.6)
Potassium: 4.8 mmol/L (ref 3.5–5.1)
Sodium: 141 mmol/L (ref 135–145)

## 2014-07-23 LAB — IRON AND TIBC
Iron: 173 ug/dL — ABNORMAL HIGH (ref 28–170)
SATURATION RATIOS: 83 % — AB (ref 10.4–31.8)
TIBC: 209 ug/dL — ABNORMAL LOW (ref 250–450)
UIBC: 36 ug/dL

## 2014-07-23 LAB — FERRITIN: Ferritin: 572 ng/mL — ABNORMAL HIGH (ref 11–307)

## 2014-07-23 MED ORDER — DARBEPOETIN ALFA-POLYSORBATE 300 MCG/0.6ML IJ SOLN: 200.0000 ug | INTRAMUSCULAR | Status: DC

## 2014-07-23 MED ORDER — DARBEPOETIN ALFA 200 MCG/0.4ML IJ SOSY
200.0000 ug | PREFILLED_SYRINGE | INTRAMUSCULAR | Status: DC
  Administered 2014-07-23: 200 ug via SUBCUTANEOUS

## 2014-07-23 MED ORDER — DARBEPOETIN ALFA 200 MCG/0.4ML IJ SOSY
PREFILLED_SYRINGE | INTRAMUSCULAR | Status: AC
  Administered 2014-07-23: 200 ug via SUBCUTANEOUS
  Filled 2014-07-23: qty 0.4

## 2014-07-24 LAB — PTH, INTACT AND CALCIUM
CALCIUM TOTAL (PTH): 8.7 mg/dL (ref 8.7–10.2)
PTH: 81 pg/mL — ABNORMAL HIGH (ref 15–65)

## 2014-07-24 LAB — POCT HEMOGLOBIN-HEMACUE: HEMOGLOBIN: 10.4 g/dL — AB (ref 12.0–15.0)

## 2014-08-06 ENCOUNTER — Encounter (HOSPITAL_COMMUNITY)
Admission: RE | Admit: 2014-08-06 | Discharge: 2014-08-06 | Disposition: A | Discharge status: Home / Self Care | Payer: Medicare Other | Source: Ambulatory Visit | Attending: Nephrology | Admitting: Nephrology

## 2014-08-06 ENCOUNTER — Encounter (HOSPITAL_COMMUNITY): Payer: Medicare Other

## 2014-08-06 DIAGNOSIS — N184 Chronic kidney disease, stage 4 (severe): Secondary | ICD-10-CM | POA: Diagnosis not present

## 2014-08-06 DIAGNOSIS — Z940 Kidney transplant status: Secondary | ICD-10-CM | POA: Diagnosis not present

## 2014-08-06 DIAGNOSIS — D631 Anemia in chronic kidney disease: Secondary | ICD-10-CM | POA: Insufficient documentation

## 2014-08-06 LAB — RENAL FUNCTION PANEL
Albumin: 3.7 g/dL (ref 3.5–5.0)
Anion gap: 12 (ref 5–15)
BUN: 69 mg/dL — ABNORMAL HIGH (ref 6–20)
CHLORIDE: 108 mmol/L (ref 101–111)
CO2: 22 mmol/L (ref 22–32)
Calcium: 9 mg/dL (ref 8.9–10.3)
Creatinine, Ser: 5.79 mg/dL — ABNORMAL HIGH (ref 0.44–1.00)
GFR calc Af Amer: 9 mL/min — ABNORMAL LOW (ref >60)
GFR calc non Af Amer: 7 mL/min — ABNORMAL LOW (ref >60)
Glucose, Bld: 102 mg/dL — ABNORMAL HIGH (ref 65–99)
Phosphorus: 5.7 mg/dL — ABNORMAL HIGH (ref 2.5–4.6)
Potassium: 4.7 mmol/L (ref 3.5–5.1)
Sodium: 142 mmol/L (ref 135–145)

## 2014-08-06 LAB — POCT HEMOGLOBIN-HEMACUE: Hemoglobin: 9.7 g/dL — ABNORMAL LOW (ref 12.0–15.0)

## 2014-08-06 MED ORDER — DARBEPOETIN ALFA 200 MCG/0.4ML IJ SOSY
PREFILLED_SYRINGE | INTRAMUSCULAR | Status: AC
  Filled 2014-08-06: qty 0.4

## 2014-08-06 MED ORDER — DARBEPOETIN ALFA-POLYSORBATE 300 MCG/0.6ML IJ SOLN
200.0000 ug | INTRAMUSCULAR | Status: DC
  Administered 2014-08-06: 200 ug via SUBCUTANEOUS

## 2014-08-07 LAB — TACROLIMUS LEVEL: TACROLIMUS (FK506) - LABCORP: 2.9 ng/mL (ref 2.0–20.0)

## 2014-08-20 ENCOUNTER — Encounter (HOSPITAL_COMMUNITY): Payer: Medicare Other

## 2014-08-21 ENCOUNTER — Encounter (HOSPITAL_COMMUNITY)
Admission: RE | Admit: 2014-08-21 | Discharge: 2014-08-21 | Disposition: A | Discharge status: Home / Self Care | Payer: Medicare Other | Source: Ambulatory Visit | Attending: Nephrology | Admitting: Nephrology

## 2014-08-21 DIAGNOSIS — N184 Chronic kidney disease, stage 4 (severe): Secondary | ICD-10-CM

## 2014-08-21 DIAGNOSIS — D631 Anemia in chronic kidney disease: Secondary | ICD-10-CM | POA: Diagnosis not present

## 2014-08-21 LAB — RENAL FUNCTION PANEL
ANION GAP: 8 (ref 5–15)
Albumin: 3.7 g/dL (ref 3.5–5.0)
BUN: 70 mg/dL — AB (ref 6–20)
CO2: 23 mmol/L (ref 22–32)
Calcium: 9.2 mg/dL (ref 8.9–10.3)
Chloride: 110 mmol/L (ref 101–111)
Creatinine, Ser: 5.95 mg/dL — ABNORMAL HIGH (ref 0.44–1.00)
GFR calc Af Amer: 8 mL/min — ABNORMAL LOW (ref >60)
GFR, EST NON AFRICAN AMERICAN: 7 mL/min — AB (ref >60)
Glucose, Bld: 96 mg/dL (ref 65–99)
Phosphorus: 5.6 mg/dL — ABNORMAL HIGH (ref 2.5–4.6)
Potassium: 5 mmol/L (ref 3.5–5.1)
Sodium: 141 mmol/L (ref 135–145)

## 2014-08-21 LAB — POCT HEMOGLOBIN-HEMACUE: HEMOGLOBIN: 10 g/dL — AB (ref 12.0–15.0)

## 2014-08-21 LAB — IRON AND TIBC
IRON: 67 ug/dL (ref 28–170)
Saturation Ratios: 27 % (ref 10.4–31.8)
TIBC: 246 ug/dL — ABNORMAL LOW (ref 250–450)
UIBC: 179 ug/dL

## 2014-08-21 LAB — FERRITIN: Ferritin: 497 ng/mL — ABNORMAL HIGH (ref 11–307)

## 2014-08-21 MED ORDER — DARBEPOETIN ALFA-POLYSORBATE 300 MCG/0.6ML IJ SOLN: 200.0000 ug | INTRAMUSCULAR | Status: DC

## 2014-08-21 MED ORDER — DARBEPOETIN ALFA 200 MCG/0.4ML IJ SOSY
PREFILLED_SYRINGE | INTRAMUSCULAR | Status: AC
  Administered 2014-08-21: 200 ug
  Filled 2014-08-21: qty 0.4

## 2014-08-21 MED ORDER — DARBEPOETIN ALFA 200 MCG/0.4ML IJ SOSY: 200.0000 ug | PREFILLED_SYRINGE | INTRAMUSCULAR | Status: DC

## 2014-08-22 LAB — PTH, INTACT AND CALCIUM
CALCIUM TOTAL (PTH): 9 mg/dL (ref 8.7–10.2)
PTH: 54 pg/mL (ref 15–65)

## 2014-09-04 ENCOUNTER — Encounter (HOSPITAL_COMMUNITY)
Admission: RE | Admit: 2014-09-04 | Discharge: 2014-09-04 | Disposition: A | Discharge status: Home / Self Care | Payer: Medicare Other | Source: Ambulatory Visit | Attending: Nephrology | Admitting: Nephrology

## 2014-09-04 DIAGNOSIS — Z940 Kidney transplant status: Secondary | ICD-10-CM | POA: Insufficient documentation

## 2014-09-04 DIAGNOSIS — D631 Anemia in chronic kidney disease: Secondary | ICD-10-CM | POA: Insufficient documentation

## 2014-09-04 DIAGNOSIS — N184 Chronic kidney disease, stage 4 (severe): Secondary | ICD-10-CM | POA: Insufficient documentation

## 2014-09-04 LAB — RENAL FUNCTION PANEL
ALBUMIN: 3.7 g/dL (ref 3.5–5.0)
Anion gap: 10 (ref 5–15)
BUN: 71 mg/dL — ABNORMAL HIGH (ref 6–20)
CHLORIDE: 107 mmol/L (ref 101–111)
CO2: 23 mmol/L (ref 22–32)
CREATININE: 5.65 mg/dL — AB (ref 0.44–1.00)
Calcium: 9.1 mg/dL (ref 8.9–10.3)
GFR calc Af Amer: 9 mL/min — ABNORMAL LOW (ref >60)
GFR, EST NON AFRICAN AMERICAN: 8 mL/min — AB (ref >60)
Glucose, Bld: 97 mg/dL (ref 65–99)
POTASSIUM: 5.6 mmol/L — AB (ref 3.5–5.1)
Phosphorus: 6.4 mg/dL — ABNORMAL HIGH (ref 2.5–4.6)
SODIUM: 140 mmol/L (ref 135–145)

## 2014-09-04 LAB — POCT HEMOGLOBIN-HEMACUE: Hemoglobin: 10.3 g/dL — ABNORMAL LOW (ref 12.0–15.0)

## 2014-09-04 MED ORDER — DARBEPOETIN ALFA 200 MCG/0.4ML IJ SOSY
PREFILLED_SYRINGE | INTRAMUSCULAR | Status: AC
  Administered 2014-09-04: 200 ug via SUBCUTANEOUS
  Filled 2014-09-04: qty 0.4

## 2014-09-04 MED ORDER — DARBEPOETIN ALFA 200 MCG/0.4ML IJ SOSY: 200.0000 ug | PREFILLED_SYRINGE | INTRAMUSCULAR | Status: DC

## 2014-09-18 ENCOUNTER — Inpatient Hospital Stay (HOSPITAL_COMMUNITY): Admission: RE | Admit: 2014-09-18 | Payer: Medicare Other | Source: Ambulatory Visit

## 2014-09-23 ENCOUNTER — Encounter (HOSPITAL_COMMUNITY)
Admission: RE | Admit: 2014-09-23 | Discharge: 2014-09-23 | Disposition: A | Discharge status: Home / Self Care | Payer: Medicare Other | Source: Ambulatory Visit | Attending: Nephrology | Admitting: Nephrology

## 2014-09-23 DIAGNOSIS — N184 Chronic kidney disease, stage 4 (severe): Secondary | ICD-10-CM

## 2014-09-23 DIAGNOSIS — D631 Anemia in chronic kidney disease: Secondary | ICD-10-CM | POA: Diagnosis not present

## 2014-09-23 LAB — POCT HEMOGLOBIN-HEMACUE: HEMOGLOBIN: 10.1 g/dL — AB (ref 12.0–15.0)

## 2014-09-23 LAB — IRON AND TIBC
Iron: 84 ug/dL (ref 28–170)
SATURATION RATIOS: 36 % — AB (ref 10.4–31.8)
TIBC: 231 ug/dL — AB (ref 250–450)
UIBC: 147 ug/dL

## 2014-09-23 LAB — RENAL FUNCTION PANEL
ALBUMIN: 3.7 g/dL (ref 3.5–5.0)
Anion gap: 11 (ref 5–15)
BUN: 61 mg/dL — AB (ref 6–20)
CO2: 25 mmol/L (ref 22–32)
Calcium: 9.2 mg/dL (ref 8.9–10.3)
Chloride: 106 mmol/L (ref 101–111)
Creatinine, Ser: 6.11 mg/dL — ABNORMAL HIGH (ref 0.44–1.00)
GFR calc Af Amer: 8 mL/min — ABNORMAL LOW (ref >60)
GFR, EST NON AFRICAN AMERICAN: 7 mL/min — AB (ref >60)
Glucose, Bld: 87 mg/dL (ref 65–99)
PHOSPHORUS: 5.7 mg/dL — AB (ref 2.5–4.6)
Potassium: 4.9 mmol/L (ref 3.5–5.1)
Sodium: 142 mmol/L (ref 135–145)

## 2014-09-23 LAB — FERRITIN: Ferritin: 361 ng/mL — ABNORMAL HIGH (ref 11–307)

## 2014-09-23 MED ORDER — DARBEPOETIN ALFA 200 MCG/0.4ML IJ SOSY
200.0000 ug | PREFILLED_SYRINGE | INTRAMUSCULAR | Status: DC
  Administered 2014-09-23: 200 ug via SUBCUTANEOUS

## 2014-09-23 MED ORDER — DARBEPOETIN ALFA 200 MCG/0.4ML IJ SOSY
PREFILLED_SYRINGE | INTRAMUSCULAR | Status: AC
  Filled 2014-09-23: qty 0.4

## 2014-09-23 MED ORDER — DARBEPOETIN ALFA-POLYSORBATE 300 MCG/0.6ML IJ SOLN: 200.0000 ug | INTRAMUSCULAR | Status: DC

## 2014-09-24 LAB — PTH, INTACT AND CALCIUM
CALCIUM TOTAL (PTH): 8.9 mg/dL (ref 8.7–10.2)
PTH: 139 pg/mL — AB (ref 15–65)

## 2014-09-24 LAB — TACROLIMUS LEVEL: TACROLIMUS (FK506) - LABCORP: 3 ng/mL (ref 2.0–20.0)

## 2014-10-07 ENCOUNTER — Encounter (HOSPITAL_COMMUNITY)
Admission: RE | Admit: 2014-10-07 | Discharge: 2014-10-07 | Disposition: A | Discharge status: Home / Self Care | Payer: Medicare Other | Source: Ambulatory Visit | Attending: Nephrology | Admitting: Nephrology

## 2014-10-07 DIAGNOSIS — N184 Chronic kidney disease, stage 4 (severe): Secondary | ICD-10-CM | POA: Insufficient documentation

## 2014-10-07 DIAGNOSIS — D631 Anemia in chronic kidney disease: Secondary | ICD-10-CM | POA: Diagnosis not present

## 2014-10-07 DIAGNOSIS — Z940 Kidney transplant status: Secondary | ICD-10-CM | POA: Insufficient documentation

## 2014-10-07 LAB — RENAL FUNCTION PANEL
ALBUMIN: 3.8 g/dL (ref 3.5–5.0)
Anion gap: 12 (ref 5–15)
BUN: 60 mg/dL — ABNORMAL HIGH (ref 6–20)
CO2: 22 mmol/L (ref 22–32)
CREATININE: 5.82 mg/dL — AB (ref 0.44–1.00)
Calcium: 9.7 mg/dL (ref 8.9–10.3)
Chloride: 105 mmol/L (ref 101–111)
GFR calc Af Amer: 9 mL/min — ABNORMAL LOW (ref >60)
GFR calc non Af Amer: 7 mL/min — ABNORMAL LOW (ref >60)
Glucose, Bld: 94 mg/dL (ref 65–99)
PHOSPHORUS: 6 mg/dL — AB (ref 2.5–4.6)
Potassium: 4.6 mmol/L (ref 3.5–5.1)
Sodium: 139 mmol/L (ref 135–145)

## 2014-10-07 LAB — POCT HEMOGLOBIN-HEMACUE: Hemoglobin: 10.4 g/dL — ABNORMAL LOW (ref 12.0–15.0)

## 2014-10-07 MED ORDER — DARBEPOETIN ALFA 200 MCG/0.4ML IJ SOSY
PREFILLED_SYRINGE | INTRAMUSCULAR | Status: AC
  Filled 2014-10-07: qty 0.4

## 2014-10-07 MED ORDER — DARBEPOETIN ALFA 200 MCG/0.4ML IJ SOSY
200.0000 ug | PREFILLED_SYRINGE | INTRAMUSCULAR | Status: DC
  Administered 2014-10-07: 200 ug via SUBCUTANEOUS

## 2014-10-07 MED ORDER — DARBEPOETIN ALFA-POLYSORBATE 300 MCG/0.6ML IJ SOLN: 200.0000 ug | INTRAMUSCULAR | Status: DC

## 2014-11-05 ENCOUNTER — Other Ambulatory Visit (HOSPITAL_COMMUNITY): Payer: Self-pay | Admitting: *Deleted

## 2014-11-06 ENCOUNTER — Encounter (HOSPITAL_COMMUNITY)
Admission: RE | Admit: 2014-11-06 | Discharge: 2014-11-06 | Disposition: A | Discharge status: Home / Self Care | Payer: Medicare Other | Source: Ambulatory Visit | Attending: Nephrology | Admitting: Nephrology

## 2014-11-06 DIAGNOSIS — N184 Chronic kidney disease, stage 4 (severe): Secondary | ICD-10-CM | POA: Insufficient documentation

## 2014-11-06 DIAGNOSIS — D631 Anemia in chronic kidney disease: Secondary | ICD-10-CM | POA: Diagnosis present

## 2014-11-06 DIAGNOSIS — Z940 Kidney transplant status: Secondary | ICD-10-CM | POA: Insufficient documentation

## 2014-11-06 LAB — IRON AND TIBC
Iron: 148 ug/dL (ref 28–170)
Saturation Ratios: 62 % — ABNORMAL HIGH (ref 10.4–31.8)
TIBC: 239 ug/dL — ABNORMAL LOW (ref 250–450)
UIBC: 91 ug/dL

## 2014-11-06 LAB — RENAL FUNCTION PANEL
ALBUMIN: 3.7 g/dL (ref 3.5–5.0)
Anion gap: 11 (ref 5–15)
BUN: 70 mg/dL — AB (ref 6–20)
CO2: 22 mmol/L (ref 22–32)
CREATININE: 5.41 mg/dL — AB (ref 0.44–1.00)
Calcium: 9.9 mg/dL (ref 8.9–10.3)
Chloride: 109 mmol/L (ref 101–111)
GFR calc Af Amer: 9 mL/min — ABNORMAL LOW (ref >60)
GFR, EST NON AFRICAN AMERICAN: 8 mL/min — AB (ref >60)
Glucose, Bld: 108 mg/dL — ABNORMAL HIGH (ref 65–99)
PHOSPHORUS: 5.1 mg/dL — AB (ref 2.5–4.6)
POTASSIUM: 4.5 mmol/L (ref 3.5–5.1)
SODIUM: 142 mmol/L (ref 135–145)

## 2014-11-06 LAB — POCT HEMOGLOBIN-HEMACUE: Hemoglobin: 10.7 g/dL — ABNORMAL LOW (ref 12.0–15.0)

## 2014-11-06 LAB — FERRITIN: FERRITIN: 476 ng/mL — AB (ref 11–307)

## 2014-11-06 LAB — MAGNESIUM: Magnesium: 2 mg/dL (ref 1.7–2.4)

## 2014-11-06 MED ORDER — DARBEPOETIN ALFA-POLYSORBATE 300 MCG/0.6ML IJ SOLN
200.0000 ug | INTRAMUSCULAR | Status: DC
  Administered 2014-11-06: 200 ug via SUBCUTANEOUS

## 2014-11-06 MED ORDER — DARBEPOETIN ALFA 200 MCG/0.4ML IJ SOSY
PREFILLED_SYRINGE | INTRAMUSCULAR | Status: AC
  Filled 2014-11-06: qty 0.4

## 2014-11-08 LAB — TACROLIMUS LEVEL: Tacrolimus (FK506) - LabCorp: 4.6 ng/mL (ref 2.0–20.0)

## 2014-11-20 ENCOUNTER — Encounter (HOSPITAL_COMMUNITY)
Admission: RE | Admit: 2014-11-20 | Discharge: 2014-11-20 | Disposition: A | Discharge status: Home / Self Care | Payer: Medicare Other | Source: Ambulatory Visit | Attending: Nephrology | Admitting: Nephrology

## 2014-11-20 DIAGNOSIS — D631 Anemia in chronic kidney disease: Secondary | ICD-10-CM | POA: Diagnosis not present

## 2014-11-20 DIAGNOSIS — N184 Chronic kidney disease, stage 4 (severe): Secondary | ICD-10-CM

## 2014-11-20 LAB — POCT HEMOGLOBIN-HEMACUE: Hemoglobin: 10.4 g/dL — ABNORMAL LOW (ref 12.0–15.0)

## 2014-11-20 MED ORDER — DARBEPOETIN ALFA 200 MCG/0.4ML IJ SOSY
PREFILLED_SYRINGE | INTRAMUSCULAR | Status: AC
  Filled 2014-11-20: qty 0.4

## 2014-11-20 MED ORDER — DARBEPOETIN ALFA 200 MCG/0.4ML IJ SOSY
200.0000 ug | PREFILLED_SYRINGE | INTRAMUSCULAR | Status: DC
  Administered 2014-11-20: 200 ug via SUBCUTANEOUS

## 2014-11-20 MED ORDER — DARBEPOETIN ALFA-POLYSORBATE 300 MCG/0.6ML IJ SOLN: 200.0000 ug | INTRAMUSCULAR | Status: DC

## 2014-12-04 ENCOUNTER — Encounter (HOSPITAL_COMMUNITY)
Admission: RE | Admit: 2014-12-04 | Discharge: 2014-12-04 | Disposition: A | Discharge status: Home / Self Care | Payer: Medicare Other | Source: Ambulatory Visit | Attending: Nephrology | Admitting: Nephrology

## 2014-12-04 DIAGNOSIS — Z940 Kidney transplant status: Secondary | ICD-10-CM | POA: Insufficient documentation

## 2014-12-04 DIAGNOSIS — D631 Anemia in chronic kidney disease: Secondary | ICD-10-CM | POA: Insufficient documentation

## 2014-12-04 DIAGNOSIS — N184 Chronic kidney disease, stage 4 (severe): Secondary | ICD-10-CM | POA: Insufficient documentation

## 2014-12-04 LAB — RENAL FUNCTION PANEL
ALBUMIN: 4 g/dL (ref 3.5–5.0)
ANION GAP: 10 (ref 5–15)
BUN: 68 mg/dL — AB (ref 6–20)
CALCIUM: 10.2 mg/dL (ref 8.9–10.3)
CO2: 25 mmol/L (ref 22–32)
Chloride: 108 mmol/L (ref 101–111)
Creatinine, Ser: 6.23 mg/dL — ABNORMAL HIGH (ref 0.44–1.00)
GFR calc Af Amer: 8 mL/min — ABNORMAL LOW (ref >60)
GFR, EST NON AFRICAN AMERICAN: 7 mL/min — AB (ref >60)
GLUCOSE: 103 mg/dL — AB (ref 65–99)
PHOSPHORUS: 6.6 mg/dL — AB (ref 2.5–4.6)
POTASSIUM: 5.2 mmol/L — AB (ref 3.5–5.1)
SODIUM: 143 mmol/L (ref 135–145)

## 2014-12-04 LAB — IRON AND TIBC
Iron: 44 ug/dL (ref 28–170)
SATURATION RATIOS: 17 % (ref 10.4–31.8)
TIBC: 263 ug/dL (ref 250–450)
UIBC: 219 ug/dL

## 2014-12-04 LAB — POCT HEMOGLOBIN-HEMACUE: Hemoglobin: 10.9 g/dL — ABNORMAL LOW (ref 12.0–15.0)

## 2014-12-04 LAB — FERRITIN: Ferritin: 488 ng/mL — ABNORMAL HIGH (ref 11–307)

## 2014-12-04 LAB — MAGNESIUM: MAGNESIUM: 2.2 mg/dL (ref 1.7–2.4)

## 2014-12-04 MED ORDER — DARBEPOETIN ALFA 200 MCG/0.4ML IJ SOSY
200.0000 ug | PREFILLED_SYRINGE | INTRAMUSCULAR | Status: DC
  Administered 2014-12-04: 200 ug via SUBCUTANEOUS

## 2014-12-04 MED ORDER — DARBEPOETIN ALFA 200 MCG/0.4ML IJ SOSY
PREFILLED_SYRINGE | INTRAMUSCULAR | Status: DC
Start: 2014-12-04 — End: 2014-12-05
  Filled 2014-12-04: qty 0.4

## 2014-12-04 MED ORDER — DARBEPOETIN ALFA-POLYSORBATE 300 MCG/0.6ML IJ SOLN: 200.0000 ug | INTRAMUSCULAR | Status: DC

## 2014-12-05 LAB — PTH, INTACT AND CALCIUM
Calcium, Total (PTH): 10 mg/dL (ref 8.7–10.2)
PTH: 62 pg/mL (ref 15–65)

## 2014-12-06 LAB — TACROLIMUS LEVEL: Tacrolimus (FK506) - LabCorp: 9.1 ng/mL (ref 2.0–20.0)

## 2014-12-24 ENCOUNTER — Other Ambulatory Visit (HOSPITAL_COMMUNITY): Payer: Self-pay | Admitting: *Deleted

## 2014-12-25 ENCOUNTER — Encounter (HOSPITAL_COMMUNITY)
Admission: RE | Admit: 2014-12-25 | Discharge: 2014-12-25 | Disposition: A | Discharge status: Home / Self Care | Payer: Medicare Other | Source: Ambulatory Visit | Attending: Nephrology | Admitting: Nephrology

## 2014-12-25 DIAGNOSIS — D631 Anemia in chronic kidney disease: Secondary | ICD-10-CM | POA: Diagnosis not present

## 2014-12-25 DIAGNOSIS — N184 Chronic kidney disease, stage 4 (severe): Secondary | ICD-10-CM

## 2014-12-25 LAB — POCT HEMOGLOBIN-HEMACUE: HEMOGLOBIN: 11.4 g/dL — AB (ref 12.0–15.0)

## 2014-12-25 MED ORDER — FERUMOXYTOL INJECTION 510 MG/17 ML
510.0000 mg | INTRAVENOUS | Status: DC
  Administered 2014-12-25: 510 mg via INTRAVENOUS
  Filled 2014-12-25: qty 17

## 2014-12-25 MED ORDER — DARBEPOETIN ALFA-POLYSORBATE 300 MCG/0.6ML IJ SOLN: 200.0000 ug | INTRAMUSCULAR | Status: DC

## 2014-12-25 MED ORDER — DARBEPOETIN ALFA 200 MCG/0.4ML IJ SOSY
PREFILLED_SYRINGE | INTRAMUSCULAR | Status: AC
  Filled 2014-12-25: qty 0.4

## 2014-12-25 MED ORDER — DARBEPOETIN ALFA 200 MCG/0.4ML IJ SOSY
200.0000 ug | PREFILLED_SYRINGE | INTRAMUSCULAR | Status: DC
  Administered 2014-12-25: 200 ug via SUBCUTANEOUS

## 2015-01-13 ENCOUNTER — Encounter (HOSPITAL_COMMUNITY)
Admission: RE | Admit: 2015-01-13 | Discharge: 2015-01-13 | Disposition: A | Discharge status: Home / Self Care | Payer: Medicare Other | Source: Ambulatory Visit | Attending: Nephrology | Admitting: Nephrology

## 2015-01-13 DIAGNOSIS — D631 Anemia in chronic kidney disease: Secondary | ICD-10-CM | POA: Diagnosis not present

## 2015-01-13 DIAGNOSIS — Z940 Kidney transplant status: Secondary | ICD-10-CM | POA: Diagnosis not present

## 2015-01-13 DIAGNOSIS — N184 Chronic kidney disease, stage 4 (severe): Secondary | ICD-10-CM | POA: Insufficient documentation

## 2015-01-13 LAB — IRON AND TIBC
Iron: 100 ug/dL (ref 28–170)
SATURATION RATIOS: 45 % — AB (ref 10.4–31.8)
TIBC: 220 ug/dL — AB (ref 250–450)
UIBC: 120 ug/dL

## 2015-01-13 LAB — RENAL FUNCTION PANEL
ALBUMIN: 3.4 g/dL — AB (ref 3.5–5.0)
ANION GAP: 10 (ref 5–15)
BUN: 78 mg/dL — AB (ref 6–20)
CALCIUM: 9 mg/dL (ref 8.9–10.3)
CO2: 22 mmol/L (ref 22–32)
CREATININE: 6.05 mg/dL — AB (ref 0.44–1.00)
Chloride: 109 mmol/L (ref 101–111)
GFR calc Af Amer: 8 mL/min — ABNORMAL LOW (ref >60)
GFR calc non Af Amer: 7 mL/min — ABNORMAL LOW (ref >60)
GLUCOSE: 100 mg/dL — AB (ref 65–99)
PHOSPHORUS: 5.9 mg/dL — AB (ref 2.5–4.6)
Potassium: 4.4 mmol/L (ref 3.5–5.1)
SODIUM: 141 mmol/L (ref 135–145)

## 2015-01-13 LAB — FERRITIN: FERRITIN: 577 ng/mL — AB (ref 11–307)

## 2015-01-13 LAB — MAGNESIUM: Magnesium: 2 mg/dL (ref 1.7–2.4)

## 2015-01-13 MED ORDER — DARBEPOETIN ALFA 200 MCG/0.4ML IJ SOSY
PREFILLED_SYRINGE | INTRAMUSCULAR | Status: AC
  Filled 2015-01-13: qty 0.4

## 2015-01-13 MED ORDER — DARBEPOETIN ALFA 200 MCG/0.4ML IJ SOSY
200.0000 ug | PREFILLED_SYRINGE | INTRAMUSCULAR | Status: DC
Start: 2015-01-13 — End: 2015-01-14
  Administered 2015-01-13: 200 ug via SUBCUTANEOUS

## 2015-01-13 MED ORDER — SODIUM CHLORIDE 0.9 % IV SOLN
510.0000 mg | INTRAVENOUS | Status: AC
  Administered 2015-01-13: 510 mg via INTRAVENOUS
  Filled 2015-01-13: qty 17

## 2015-01-14 LAB — PTH, INTACT AND CALCIUM
Calcium, Total (PTH): 9.1 mg/dL (ref 8.7–10.2)
PTH: 97 pg/mL — ABNORMAL HIGH (ref 15–65)

## 2015-01-14 LAB — POCT HEMOGLOBIN-HEMACUE: Hemoglobin: 9.8 g/dL — ABNORMAL LOW (ref 12.0–15.0)

## 2015-01-14 LAB — VITAMIN D 25 HYDROXY (VIT D DEFICIENCY, FRACTURES): VIT D 25 HYDROXY: 43.7 ng/mL (ref 30.0–100.0)

## 2015-01-14 LAB — TACROLIMUS LEVEL: TACROLIMUS (FK506) - LABCORP: 4.1 ng/mL (ref 2.0–20.0)

## 2015-01-15 LAB — EPSTEIN BARR VRS(EBV DNA BY PCR)
EBV DNA QN BY PCR: POSITIVE {copies}/mL
LOG10 EBV DNA QN PCR: UNDETERMINED {Log_copies}/mL

## 2015-01-17 LAB — BK VIRUS QUANT PCR, URINE: BK VIRUS DNA QN PCR: NOT DETECTED {copies}/mL

## 2015-01-26 LAB — CMV (CYTOMEGALOVIRUS) DNA ULTRAQUANT, PCR
CMV DNA QUANT: NEGATIVE [IU]/mL
LOG10 CMV QN DNA PL: UNDETERMINED {Log_IU}/mL

## 2015-02-03 ENCOUNTER — Encounter (HOSPITAL_COMMUNITY)
Admission: RE | Admit: 2015-02-03 | Discharge: 2015-02-03 | Disposition: A | Discharge status: Home / Self Care | Payer: Medicare Other | Source: Ambulatory Visit | Attending: Nephrology | Admitting: Nephrology

## 2015-02-03 DIAGNOSIS — Z940 Kidney transplant status: Secondary | ICD-10-CM | POA: Diagnosis not present

## 2015-02-03 DIAGNOSIS — N184 Chronic kidney disease, stage 4 (severe): Secondary | ICD-10-CM | POA: Diagnosis not present

## 2015-02-03 DIAGNOSIS — D631 Anemia in chronic kidney disease: Secondary | ICD-10-CM | POA: Diagnosis present

## 2015-02-03 DIAGNOSIS — T451X5D Adverse effect of antineoplastic and immunosuppressive drugs, subsequent encounter: Secondary | ICD-10-CM

## 2015-02-03 LAB — COMPREHENSIVE METABOLIC PANEL
ALK PHOS: 61 U/L (ref 38–126)
ALT: 14 U/L (ref 14–54)
AST: 16 U/L (ref 15–41)
Albumin: 3.5 g/dL (ref 3.5–5.0)
Anion gap: 12 (ref 5–15)
BILIRUBIN TOTAL: 0.5 mg/dL (ref 0.3–1.2)
BUN: 69 mg/dL — AB (ref 6–20)
CALCIUM: 9.9 mg/dL (ref 8.9–10.3)
CHLORIDE: 107 mmol/L (ref 101–111)
CO2: 24 mmol/L (ref 22–32)
CREATININE: 7.05 mg/dL — AB (ref 0.44–1.00)
GFR, EST AFRICAN AMERICAN: 7 mL/min — AB (ref >60)
GFR, EST NON AFRICAN AMERICAN: 6 mL/min — AB (ref >60)
Glucose, Bld: 92 mg/dL (ref 65–99)
Potassium: 4.8 mmol/L (ref 3.5–5.1)
Sodium: 143 mmol/L (ref 135–145)
TOTAL PROTEIN: 6.7 g/dL (ref 6.5–8.1)

## 2015-02-03 LAB — PHOSPHORUS: PHOSPHORUS: 5.9 mg/dL — AB (ref 2.5–4.6)

## 2015-02-03 LAB — POCT HEMOGLOBIN-HEMACUE: Hemoglobin: 10.3 g/dL — ABNORMAL LOW (ref 12.0–15.0)

## 2015-02-03 LAB — MAGNESIUM: MAGNESIUM: 2.1 mg/dL (ref 1.7–2.4)

## 2015-02-03 MED ORDER — DARBEPOETIN ALFA 200 MCG/0.4ML IJ SOSY
PREFILLED_SYRINGE | INTRAMUSCULAR | Status: AC
  Administered 2015-02-03: 200 ug via SUBCUTANEOUS
  Filled 2015-02-03: qty 0.4

## 2015-02-03 MED ORDER — DARBEPOETIN ALFA 200 MCG/0.4ML IJ SOSY
200.0000 ug | PREFILLED_SYRINGE | INTRAMUSCULAR | Status: DC
  Administered 2015-02-03: 200 ug via SUBCUTANEOUS

## 2015-02-05 LAB — EPSTEIN BARR VRS(EBV DNA BY PCR)
EBV DNA QN by PCR: NEGATIVE copies/mL
log10 EBV DNA Qn PCR: UNDETERMINED log10 copy/mL

## 2015-02-17 ENCOUNTER — Encounter (HOSPITAL_COMMUNITY)
Admission: RE | Admit: 2015-02-17 | Discharge: 2015-02-17 | Disposition: A | Discharge status: Home / Self Care | Payer: Medicare Other | Source: Ambulatory Visit | Attending: Nephrology | Admitting: Nephrology

## 2015-02-17 DIAGNOSIS — N184 Chronic kidney disease, stage 4 (severe): Secondary | ICD-10-CM

## 2015-02-17 DIAGNOSIS — D631 Anemia in chronic kidney disease: Secondary | ICD-10-CM | POA: Diagnosis not present

## 2015-02-17 DIAGNOSIS — T451X5D Adverse effect of antineoplastic and immunosuppressive drugs, subsequent encounter: Secondary | ICD-10-CM

## 2015-02-17 LAB — MAGNESIUM: Magnesium: 2.3 mg/dL (ref 1.7–2.4)

## 2015-02-17 LAB — RENAL FUNCTION PANEL
ANION GAP: 10 (ref 5–15)
Albumin: 3.7 g/dL (ref 3.5–5.0)
BUN: 58 mg/dL — ABNORMAL HIGH (ref 6–20)
CHLORIDE: 109 mmol/L (ref 101–111)
CO2: 24 mmol/L (ref 22–32)
Calcium: 9.6 mg/dL (ref 8.9–10.3)
Creatinine, Ser: 6.73 mg/dL — ABNORMAL HIGH (ref 0.44–1.00)
GFR calc non Af Amer: 6 mL/min — ABNORMAL LOW (ref >60)
GFR, EST AFRICAN AMERICAN: 7 mL/min — AB (ref >60)
Glucose, Bld: 99 mg/dL (ref 65–99)
Phosphorus: 5.5 mg/dL — ABNORMAL HIGH (ref 2.5–4.6)
Potassium: 4.6 mmol/L (ref 3.5–5.1)
Sodium: 143 mmol/L (ref 135–145)

## 2015-02-17 LAB — IRON AND TIBC
IRON: 61 ug/dL (ref 28–170)
Saturation Ratios: 28 % (ref 10.4–31.8)
TIBC: 217 ug/dL — ABNORMAL LOW (ref 250–450)
UIBC: 156 ug/dL

## 2015-02-17 LAB — FERRITIN: FERRITIN: 697 ng/mL — AB (ref 11–307)

## 2015-02-17 LAB — POCT HEMOGLOBIN-HEMACUE: HEMOGLOBIN: 11.3 g/dL — AB (ref 12.0–15.0)

## 2015-02-17 MED ORDER — DARBEPOETIN ALFA 200 MCG/0.4ML IJ SOSY
PREFILLED_SYRINGE | INTRAMUSCULAR | Status: AC
  Administered 2015-02-17: 200 ug via SUBCUTANEOUS
  Filled 2015-02-17: qty 0.4

## 2015-02-17 MED ORDER — DARBEPOETIN ALFA 200 MCG/0.4ML IJ SOSY
200.0000 ug | PREFILLED_SYRINGE | INTRAMUSCULAR | Status: DC
  Administered 2015-02-17: 200 ug via SUBCUTANEOUS

## 2015-02-18 LAB — TACROLIMUS LEVEL: Tacrolimus (FK506) - LabCorp: 4.2 ng/mL (ref 2.0–20.0)

## 2015-02-19 LAB — EPSTEIN BARR VRS(EBV DNA BY PCR)
EBV DNA QN by PCR: NEGATIVE copies/mL
LOG10 EBV DNA QN PCR: UNDETERMINED {Log_copies}/mL

## 2015-03-05 ENCOUNTER — Encounter (HOSPITAL_COMMUNITY)
Admission: RE | Admit: 2015-03-05 | Discharge: 2015-03-05 | Disposition: A | Discharge status: Home / Self Care | Payer: Medicare Other | Source: Ambulatory Visit | Attending: Nephrology | Admitting: Nephrology

## 2015-03-05 DIAGNOSIS — D631 Anemia in chronic kidney disease: Secondary | ICD-10-CM | POA: Insufficient documentation

## 2015-03-05 DIAGNOSIS — Z940 Kidney transplant status: Secondary | ICD-10-CM | POA: Diagnosis not present

## 2015-03-05 DIAGNOSIS — T451X5D Adverse effect of antineoplastic and immunosuppressive drugs, subsequent encounter: Secondary | ICD-10-CM

## 2015-03-05 DIAGNOSIS — N184 Chronic kidney disease, stage 4 (severe): Secondary | ICD-10-CM | POA: Insufficient documentation

## 2015-03-05 LAB — POCT HEMOGLOBIN-HEMACUE: HEMOGLOBIN: 12 g/dL (ref 12.0–15.0)

## 2015-03-05 MED ORDER — DARBEPOETIN ALFA 200 MCG/0.4ML IJ SOSY: 200.0000 ug | PREFILLED_SYRINGE | INTRAMUSCULAR | Status: DC

## 2015-03-19 ENCOUNTER — Inpatient Hospital Stay (HOSPITAL_COMMUNITY): Admission: RE | Admit: 2015-03-19 | Payer: Medicare Other | Source: Ambulatory Visit

## 2015-03-26 ENCOUNTER — Encounter (HOSPITAL_COMMUNITY): Payer: Medicare Other

## 2015-03-31 ENCOUNTER — Encounter (HOSPITAL_COMMUNITY)
Admission: RE | Admit: 2015-03-31 | Discharge: 2015-03-31 | Disposition: A | Discharge status: Home / Self Care | Payer: Medicare Other | Source: Ambulatory Visit | Attending: Nephrology | Admitting: Nephrology

## 2015-03-31 DIAGNOSIS — T451X5D Adverse effect of antineoplastic and immunosuppressive drugs, subsequent encounter: Secondary | ICD-10-CM

## 2015-03-31 DIAGNOSIS — N184 Chronic kidney disease, stage 4 (severe): Secondary | ICD-10-CM

## 2015-03-31 DIAGNOSIS — D631 Anemia in chronic kidney disease: Secondary | ICD-10-CM | POA: Diagnosis not present

## 2015-03-31 LAB — RENAL FUNCTION PANEL
ANION GAP: 17 — AB (ref 5–15)
Albumin: 3.8 g/dL (ref 3.5–5.0)
BUN: 87 mg/dL — ABNORMAL HIGH (ref 6–20)
CHLORIDE: 96 mmol/L — AB (ref 101–111)
CO2: 26 mmol/L (ref 22–32)
Calcium: 12.7 mg/dL — ABNORMAL HIGH (ref 8.9–10.3)
Creatinine, Ser: 13.15 mg/dL — ABNORMAL HIGH (ref 0.44–1.00)
GFR calc Af Amer: 3 mL/min — ABNORMAL LOW (ref >60)
GFR calc non Af Amer: 3 mL/min — ABNORMAL LOW (ref >60)
GLUCOSE: 103 mg/dL — AB (ref 65–99)
POTASSIUM: 4.8 mmol/L (ref 3.5–5.1)
Phosphorus: 5.5 mg/dL — ABNORMAL HIGH (ref 2.5–4.6)
Sodium: 139 mmol/L (ref 135–145)

## 2015-03-31 LAB — MAGNESIUM: Magnesium: 2.1 mg/dL (ref 1.7–2.4)

## 2015-03-31 LAB — IRON AND TIBC
Iron: 177 ug/dL — ABNORMAL HIGH (ref 28–170)
SATURATION RATIOS: 82 % — AB (ref 10.4–31.8)
TIBC: 217 ug/dL — AB (ref 250–450)
UIBC: 40 ug/dL

## 2015-03-31 LAB — POCT HEMOGLOBIN-HEMACUE: Hemoglobin: 10.9 g/dL — ABNORMAL LOW (ref 12.0–15.0)

## 2015-03-31 LAB — FERRITIN: Ferritin: 902 ng/mL — ABNORMAL HIGH (ref 11–307)

## 2015-03-31 MED ORDER — DARBEPOETIN ALFA 200 MCG/0.4ML IJ SOSY
PREFILLED_SYRINGE | INTRAMUSCULAR | Status: AC
  Filled 2015-03-31: qty 0.4

## 2015-03-31 MED ORDER — DARBEPOETIN ALFA 200 MCG/0.4ML IJ SOSY
200.0000 ug | PREFILLED_SYRINGE | INTRAMUSCULAR | Status: DC
  Administered 2015-03-31: 200 ug via SUBCUTANEOUS

## 2015-04-01 LAB — TACROLIMUS LEVEL: Tacrolimus (FK506) - LabCorp: 4.2 ng/mL (ref 2.0–20.0)

## 2015-04-10 ENCOUNTER — Encounter: Payer: Self-pay | Admitting: Gastroenterology

## 2015-04-23 ENCOUNTER — Encounter (HOSPITAL_COMMUNITY)
Admission: RE | Admit: 2015-04-23 | Discharge: 2015-04-23 | Disposition: A | Discharge status: Home / Self Care | Payer: Medicare Other | Source: Ambulatory Visit | Attending: Nephrology | Admitting: Nephrology

## 2015-04-23 DIAGNOSIS — D631 Anemia in chronic kidney disease: Secondary | ICD-10-CM | POA: Diagnosis not present

## 2015-04-23 DIAGNOSIS — Z940 Kidney transplant status: Secondary | ICD-10-CM | POA: Insufficient documentation

## 2015-04-23 DIAGNOSIS — T451X5D Adverse effect of antineoplastic and immunosuppressive drugs, subsequent encounter: Secondary | ICD-10-CM

## 2015-04-23 DIAGNOSIS — N184 Chronic kidney disease, stage 4 (severe): Secondary | ICD-10-CM | POA: Insufficient documentation

## 2015-04-23 LAB — POCT HEMOGLOBIN-HEMACUE: Hemoglobin: 8.9 g/dL — ABNORMAL LOW (ref 12.0–15.0)

## 2015-04-23 MED ORDER — DARBEPOETIN ALFA 200 MCG/0.4ML IJ SOSY
200.0000 ug | PREFILLED_SYRINGE | INTRAMUSCULAR | Status: DC
  Administered 2015-04-23: 200 ug via SUBCUTANEOUS

## 2015-04-23 MED ORDER — DARBEPOETIN ALFA 200 MCG/0.4ML IJ SOSY
PREFILLED_SYRINGE | INTRAMUSCULAR | Status: AC
  Administered 2015-04-23: 200 ug via SUBCUTANEOUS
  Filled 2015-04-23: qty 0.4

## 2015-05-06 ENCOUNTER — Other Ambulatory Visit (HOSPITAL_COMMUNITY): Payer: Self-pay | Admitting: *Deleted

## 2015-05-07 ENCOUNTER — Encounter (HOSPITAL_COMMUNITY): Payer: Medicare Other

## 2015-05-07 ENCOUNTER — Encounter (HOSPITAL_COMMUNITY)
Admission: RE | Admit: 2015-05-07 | Discharge: 2015-05-07 | Disposition: A | Discharge status: Home / Self Care | Payer: Medicare Other | Source: Ambulatory Visit | Attending: Nephrology | Admitting: Nephrology

## 2015-05-07 DIAGNOSIS — D631 Anemia in chronic kidney disease: Secondary | ICD-10-CM | POA: Diagnosis present

## 2015-05-07 DIAGNOSIS — Z940 Kidney transplant status: Secondary | ICD-10-CM | POA: Diagnosis not present

## 2015-05-07 DIAGNOSIS — T451X5D Adverse effect of antineoplastic and immunosuppressive drugs, subsequent encounter: Secondary | ICD-10-CM

## 2015-05-07 DIAGNOSIS — N184 Chronic kidney disease, stage 4 (severe): Secondary | ICD-10-CM | POA: Insufficient documentation

## 2015-05-07 LAB — RENAL FUNCTION PANEL
ALBUMIN: 3.6 g/dL (ref 3.5–5.0)
ANION GAP: 10 (ref 5–15)
BUN: 63 mg/dL — AB (ref 6–20)
CALCIUM: 9.1 mg/dL (ref 8.9–10.3)
CO2: 20 mmol/L — AB (ref 22–32)
CREATININE: 6.96 mg/dL — AB (ref 0.44–1.00)
Chloride: 113 mmol/L — ABNORMAL HIGH (ref 101–111)
GFR calc Af Amer: 7 mL/min — ABNORMAL LOW (ref >60)
GFR calc non Af Amer: 6 mL/min — ABNORMAL LOW (ref >60)
GLUCOSE: 98 mg/dL (ref 65–99)
PHOSPHORUS: 4.2 mg/dL (ref 2.5–4.6)
Potassium: 4.5 mmol/L (ref 3.5–5.1)
SODIUM: 143 mmol/L (ref 135–145)

## 2015-05-07 LAB — POCT HEMOGLOBIN-HEMACUE: HEMOGLOBIN: 8.9 g/dL — AB (ref 12.0–15.0)

## 2015-05-07 LAB — MAGNESIUM: Magnesium: 1.8 mg/dL (ref 1.7–2.4)

## 2015-05-07 LAB — IRON AND TIBC
Iron: 124 ug/dL (ref 28–170)
SATURATION RATIOS: 58 % — AB (ref 10.4–31.8)
TIBC: 216 ug/dL — ABNORMAL LOW (ref 250–450)
UIBC: 92 ug/dL

## 2015-05-07 LAB — FERRITIN: FERRITIN: 617 ng/mL — AB (ref 11–307)

## 2015-05-07 MED ORDER — DARBEPOETIN ALFA 200 MCG/0.4ML IJ SOSY
200.0000 ug | PREFILLED_SYRINGE | INTRAMUSCULAR | Status: DC
  Administered 2015-05-07: 200 ug via SUBCUTANEOUS

## 2015-05-07 MED ORDER — DARBEPOETIN ALFA 200 MCG/0.4ML IJ SOSY
PREFILLED_SYRINGE | INTRAMUSCULAR | Status: AC
  Filled 2015-05-07: qty 0.4

## 2015-05-08 LAB — TACROLIMUS LEVEL: Tacrolimus (FK506) - LabCorp: 3 ng/mL (ref 2.0–20.0)

## 2015-05-21 ENCOUNTER — Inpatient Hospital Stay (HOSPITAL_COMMUNITY): Admission: RE | Admit: 2015-05-21 | Payer: Medicare Other | Source: Ambulatory Visit

## 2015-05-26 ENCOUNTER — Encounter: Payer: Self-pay | Admitting: Gastroenterology

## 2015-05-26 ENCOUNTER — Ambulatory Visit (INDEPENDENT_AMBULATORY_CARE_PROVIDER_SITE_OTHER): Payer: Medicare Other | Admitting: Gastroenterology

## 2015-05-26 VITALS — BP 124/76 | HR 84 | Ht 62.0 in | Wt 176.1 lb

## 2015-05-26 DIAGNOSIS — Z1211 Encounter for screening for malignant neoplasm of colon: Secondary | ICD-10-CM | POA: Diagnosis not present

## 2015-05-26 DIAGNOSIS — N184 Chronic kidney disease, stage 4 (severe): Secondary | ICD-10-CM

## 2015-05-26 MED ORDER — PEG-KCL-NACL-NASULF-NA ASC-C 100 G PO SOLR: 1.0000 | Freq: Once | ORAL | Status: DC

## 2015-05-26 NOTE — Patient Instructions (Addendum)
You have been scheduled for a colonoscopy. Please follow written instructions given to you at your visit today.  Please pick up your prep supplies at the pharmacy within the next 1-3 days. If you use inhalers (even only as needed), please bring them with you on the day of your procedure. Your physician has requested that you go to www.startemmi.com and enter the access code given to you at your visit today. This web site gives a general overview about your procedure. However, you should still follow specific instructions given to you by our office regarding your preparation for the procedure.  You were given a sample of Moviprep for your colonoscopy.   Thank you for choosing [facility]  Dr Wilfrid Lund III

## 2015-05-26 NOTE — Progress Notes (Signed)
[facility] Consult Note:  History: [name] 05/26/2015  Referring physician: Berkley Harvey, NP  Reason for consult/chief complaint: Colon Cancer Screening   Subjective HPI:   No prior CRC screening.  Needs colonoscopy prior to listing for repeat renal transplant. Not currently on dialysis. Denies abdominal pain, rectal bleeding or change in bowel habits. ROS:  Review of Systems Denies chest pain, dyspnea  Past Medical History: Past Medical History  Diagnosis Date  . Chronic kidney disease   . Anemia   . Stroke [facility]) 2003  . Hypertension   . Hypercalcemia   . Hypomagnesemia   . Complications of transplanted kidney   . Chronic kidney disease, stage IV (severe) ([place])   . Loss of weight   . Pancytopenia ([place])   . Adverse effect of immunosuppressive drug   . Diabetes mellitus without complication ([place])   . Hypotension   . Arthritis   . DVT (deep venous thrombosis) ([place]) 2014    right thigh  . Seizure ([place]) 2011    during MRI/CT     Past Surgical History: Past Surgical History  Procedure Laterality Date  . Kidney transplant Right 2005  . Peritoneal catheter insertion  1993  . Ureter surgery      age 55  . Cesarean section       Family History: Family History  Problem Relation Age of Onset  . Breast cancer Mother   . Hyperlipidemia Father   . Kidney disease Father   . Heart disease Paternal Grandmother   . Glaucoma Paternal Grandfather     Social History: Social History   Social History  . Marital Status: Divorced    Spouse Name: N/A  . Number of Children: 1  . Years of Education: N/A   Occupational History  . Librarian/retired Pharmacist, hospital    Social History Main Topics  . Smoking status: Never Smoker   . Smokeless tobacco: Never Used  . Alcohol Use: 0.0 oz/week    0 Standard drinks or equivalent per week     Comment: wine at Christmas  . Drug Use: No  . Sexual Activity: Not Asked   Other Topics Concern  . None   Social  History Narrative    Allergies: Allergies  Allergen Reactions  . Cephalosporins Diarrhea and Nausea Only  . Erythromycin Diarrhea and Nausea Only  . Fexofenadine-Pseudoephed Er Other (See Comments)    dizzy  . Allegra Allergy [Fexofenadine Hcl]   . Cephalexin   . Fexofenadine Other (See Comments)    dizziness    Outpatient Meds: Current Outpatient Prescriptions  Medication Sig Dispense Refill  . acetaminophen (TYLENOL) 160 MG/5ML solution Take 20.3 mLs (650 mg total) by mouth every 8 (eight) hours as needed for mild pain. 120 mL 0  . amLODipine (NORVASC) 5 MG tablet Take 5 mg by mouth at bedtime.    Marland Kitchen aspirin (ECOTRIN LOW STRENGTH) 81 MG EC tablet Take 81 mg by mouth daily. Swallow whole.    . calcitRIOL (ROCALTROL) 0.5 MCG capsule Take 1 mcg by mouth 2 (two) times a week.    . cinacalcet (SENSIPAR) 30 MG tablet Take 1 tablet (30 mg total) by mouth daily with breakfast. 60 tablet 0  . Darbepoetin Alfa (ARANESP) 200 MCG/0.4ML SOSY injection Inject into the skin every 21 ( twenty-one) days. As needed    . metoprolol succinate (TOPROL-XL) 25 MG 24 hr tablet Take 50 mg by mouth 2 (two) times daily.    . Multiple Vitamin (MULTI-VITAMINS) TABS Take 1 tablet  by mouth daily.    . mycophenolate (CELLCEPT) 250 MG capsule Take 1 capsule (250 mg total) by mouth 2 (two) times daily. 60 capsule 0  . sodium bicarbonate 650 MG tablet Take 2 tablets (1,300 mg total) by mouth 3 (three) times daily. 180 tablet 0  . tacrolimus (PROGRAF) 1 MG capsule Take 2 mg by mouth 2 (two) times daily.     No current facility-administered medications for this visit.      ___________________________________________________________________ Objective  Exam:  BP 124/76 mmHg  Pulse 84  Ht 5\' 2"  (1.575 m)  Wt 176 lb 2 oz (79.89 kg)  BMI 32.21 kg/m2   General: this is a(n) well-appearing AA woman with good muscle mass   Eyes: sclera anicteric, no redness  ENT: oral mucosa moist without lesions, no  cervical or supraclavicular lymphadenopathy, good dentition  CV: RRR without murmur, S1/S2, no JVD, no peripheral edema  Resp: clear to auscultation bilaterally, normal RR and effort noted  GI: soft, no tenderness, with active bowel sounds. No guarding or palpable organomegaly noted. + transplanted kidney palpable in RLQ, overlying scar and small hernia  Skin; warm and dry, no rash or jaundice noted  Neuro: awake, alert and oriented x 3. Normal gross motor function and fluent speech  Labs:  CMP     Component Value Date/Time   NA 143 05/07/2015 1354   K 4.5 05/07/2015 1354   CL 113* 05/07/2015 1354   CO2 20* 05/07/2015 1354   GLUCOSE 98 05/07/2015 1354   BUN 63* 05/07/2015 1354   CREATININE 6.96* 05/07/2015 1354   CALCIUM 9.1 05/07/2015 1354   CALCIUM 9.1 01/13/2015 1137   PROT 6.7 02/03/2015 1409   ALBUMIN 3.6 05/07/2015 1354   AST 16 02/03/2015 1409   ALT 14 02/03/2015 1409   ALKPHOS 61 02/03/2015 1409   BILITOT 0.5 02/03/2015 1409   GFRNONAA 6* 05/07/2015 1354   GFRAA 7* 05/07/2015 1354     Assessment: Encounter Diagnoses  Name Primary?  . Special screening for malignant neoplasms, colon Yes  . Chronic kidney disease, stage IV (severe) (HCC)     Discussed colonoscopy, and she is agreeable.  The benefits and risks of the planned procedure were described in detail with the patient or (when appropriate) their health care proxy.  Risks were outlined as including, but not limited to, bleeding, infection, perforation, adverse medication reaction leading to cardiac or pulmonary decompensation, or pancreatitis (if ERCP).  The limitation of incomplete mucosal visualization was also discussed.  No guarantees or warranties were given.  Plan:  Will use PEG prep due to CKD  Thank you for the courtesy of this consult.  Please call me with any questions or concerns.  Nelida Meuse III

## 2015-06-02 ENCOUNTER — Encounter (HOSPITAL_COMMUNITY)
Admission: RE | Admit: 2015-06-02 | Discharge: 2015-06-02 | Disposition: A | Discharge status: Home / Self Care | Payer: Medicare Other | Source: Ambulatory Visit | Attending: Nephrology | Admitting: Nephrology

## 2015-06-02 DIAGNOSIS — Z940 Kidney transplant status: Secondary | ICD-10-CM | POA: Insufficient documentation

## 2015-06-02 DIAGNOSIS — D631 Anemia in chronic kidney disease: Secondary | ICD-10-CM | POA: Diagnosis present

## 2015-06-02 DIAGNOSIS — N184 Chronic kidney disease, stage 4 (severe): Secondary | ICD-10-CM | POA: Diagnosis not present

## 2015-06-02 DIAGNOSIS — T451X5D Adverse effect of antineoplastic and immunosuppressive drugs, subsequent encounter: Secondary | ICD-10-CM

## 2015-06-02 LAB — RENAL FUNCTION PANEL
ALBUMIN: 3.4 g/dL — AB (ref 3.5–5.0)
ANION GAP: 10 (ref 5–15)
BUN: 48 mg/dL — AB (ref 6–20)
CALCIUM: 8.8 mg/dL — AB (ref 8.9–10.3)
CHLORIDE: 113 mmol/L — AB (ref 101–111)
CO2: 21 mmol/L — AB (ref 22–32)
CREATININE: 6.42 mg/dL — AB (ref 0.44–1.00)
GFR calc Af Amer: 8 mL/min — ABNORMAL LOW (ref >60)
GFR, EST NON AFRICAN AMERICAN: 7 mL/min — AB (ref >60)
GLUCOSE: 92 mg/dL (ref 65–99)
Phosphorus: 4 mg/dL (ref 2.5–4.6)
Potassium: 4.6 mmol/L (ref 3.5–5.1)
SODIUM: 144 mmol/L (ref 135–145)

## 2015-06-02 LAB — MAGNESIUM: MAGNESIUM: 1.8 mg/dL (ref 1.7–2.4)

## 2015-06-02 LAB — IRON AND TIBC
Iron: 120 ug/dL (ref 28–170)
Saturation Ratios: 60 % — ABNORMAL HIGH (ref 10.4–31.8)
TIBC: 199 ug/dL — ABNORMAL LOW (ref 250–450)
UIBC: 79 ug/dL

## 2015-06-02 LAB — PREPARE RBC (CROSSMATCH)

## 2015-06-02 LAB — FERRITIN: Ferritin: 660 ng/mL — ABNORMAL HIGH (ref 11–307)

## 2015-06-02 LAB — POCT HEMOGLOBIN-HEMACUE: Hemoglobin: 7.6 g/dL — ABNORMAL LOW (ref 12.0–15.0)

## 2015-06-02 MED ORDER — DARBEPOETIN ALFA 100 MCG/0.5ML IJ SOSY
PREFILLED_SYRINGE | INTRAMUSCULAR | Status: AC
  Administered 2015-06-02: 100 ug via SUBCUTANEOUS
  Filled 2015-06-02: qty 0.5

## 2015-06-02 MED ORDER — DARBEPOETIN ALFA 200 MCG/0.4ML IJ SOSY
PREFILLED_SYRINGE | INTRAMUSCULAR | Status: AC
Start: 2015-06-02 — End: 2015-06-03
  Filled 2015-06-02: qty 0.4

## 2015-06-02 MED ORDER — DARBEPOETIN ALFA 300 MCG/0.6ML IJ SOSY
300.0000 ug | PREFILLED_SYRINGE | INTRAMUSCULAR | Status: DC
  Filled 2015-06-02: qty 0.6

## 2015-06-02 MED ORDER — DARBEPOETIN ALFA 200 MCG/0.4ML IJ SOSY
200.0000 ug | PREFILLED_SYRINGE | INTRAMUSCULAR | Status: DC
Start: 2015-06-02 — End: 2015-06-12
  Administered 2015-06-02: 200 ug via SUBCUTANEOUS

## 2015-06-03 ENCOUNTER — Other Ambulatory Visit (HOSPITAL_COMMUNITY): Payer: Self-pay | Admitting: *Deleted

## 2015-06-03 LAB — PTH, INTACT AND CALCIUM
CALCIUM TOTAL (PTH): 8.6 mg/dL — AB (ref 8.7–10.2)
PTH: 112 pg/mL — ABNORMAL HIGH (ref 15–65)

## 2015-06-03 LAB — TACROLIMUS LEVEL: TACROLIMUS (FK506) - LABCORP: 3.9 ng/mL (ref 2.0–20.0)

## 2015-06-04 ENCOUNTER — Telehealth: Payer: Self-pay | Admitting: Gastroenterology

## 2015-06-04 ENCOUNTER — Encounter (HOSPITAL_COMMUNITY)
Admission: RE | Admit: 2015-06-04 | Discharge: 2015-06-04 | Disposition: A | Discharge status: Home / Self Care | Payer: Medicare Other | Source: Ambulatory Visit | Attending: Nephrology | Admitting: Nephrology

## 2015-06-04 DIAGNOSIS — D631 Anemia in chronic kidney disease: Secondary | ICD-10-CM | POA: Diagnosis not present

## 2015-06-04 DIAGNOSIS — D649 Anemia, unspecified: Secondary | ICD-10-CM

## 2015-06-04 DIAGNOSIS — N189 Chronic kidney disease, unspecified: Secondary | ICD-10-CM

## 2015-06-04 LAB — PREPARE RBC (CROSSMATCH)

## 2015-06-04 MED ORDER — ACETAMINOPHEN 325 MG PO TABS
650.0000 mg | ORAL_TABLET | ORAL | Status: DC
  Administered 2015-06-04: 650 mg via ORAL

## 2015-06-04 MED ORDER — ACETAMINOPHEN 325 MG PO TABS
ORAL_TABLET | ORAL | Status: AC
  Filled 2015-06-04: qty 2

## 2015-06-04 MED ORDER — DIPHENHYDRAMINE HCL 25 MG PO CAPS
25.0000 mg | ORAL_CAPSULE | Freq: Once | ORAL | Status: AC
  Administered 2015-06-04: 25 mg via ORAL

## 2015-06-04 MED ORDER — SODIUM CHLORIDE 0.9 % IV SOLN: Freq: Once | INTRAVENOUS | Status: DC

## 2015-06-04 MED ORDER — DIPHENHYDRAMINE HCL 25 MG PO CAPS
ORAL_CAPSULE | ORAL | Status: AC
  Filled 2015-06-04: qty 1

## 2015-06-04 NOTE — Telephone Encounter (Signed)
Pt had 1 unit blood transfusion today for a Hgb of 7.6.  She has a colon scheduled for 06/11/15.  Can she continue as planned?  No lab results available since transfusion.

## 2015-06-04 NOTE — Telephone Encounter (Signed)
I am glad she let us know. The anemia is most likely from her chronic renal failure.  Please be sure she gets a CBC next Monday or tuesday

## 2015-06-05 NOTE — Telephone Encounter (Signed)
Pt aware and will come in on Monday or Tuesday for labs

## 2015-06-06 LAB — TYPE AND SCREEN
ABO/RH(D): A NEG
ANTIBODY SCREEN: NEGATIVE
UNIT DIVISION: 0
Unit division: 0

## 2015-06-09 ENCOUNTER — Other Ambulatory Visit (INDEPENDENT_AMBULATORY_CARE_PROVIDER_SITE_OTHER): Payer: Medicare Other

## 2015-06-09 DIAGNOSIS — D649 Anemia, unspecified: Secondary | ICD-10-CM

## 2015-06-09 DIAGNOSIS — N189 Chronic kidney disease, unspecified: Secondary | ICD-10-CM

## 2015-06-09 LAB — CBC WITH DIFFERENTIAL/PLATELET
BASOS PCT: 0.1 % (ref 0.0–3.0)
Basophils Absolute: 0 10*3/uL (ref 0.0–0.1)
EOS PCT: 1.1 % (ref 0.0–5.0)
Eosinophils Absolute: 0.1 10*3/uL (ref 0.0–0.7)
HEMATOCRIT: 29.7 % — AB (ref 36.0–46.0)
HEMOGLOBIN: 9.6 g/dL — AB (ref 12.0–15.0)
LYMPHS PCT: 26.1 % (ref 12.0–46.0)
Lymphs Abs: 1.6 10*3/uL (ref 0.7–4.0)
MCHC: 32.2 g/dL (ref 30.0–36.0)
MCV: 79.3 fl (ref 78.0–100.0)
Monocytes Absolute: 0.6 10*3/uL (ref 0.1–1.0)
Monocytes Relative: 10.1 % (ref 3.0–12.0)
Neutro Abs: 3.8 10*3/uL (ref 1.4–7.7)
Neutrophils Relative %: 62.6 % (ref 43.0–77.0)
Platelets: 242 10*3/uL (ref 150.0–400.0)
RBC: 3.75 Mil/uL — ABNORMAL LOW (ref 3.87–5.11)
RDW: 17.1 % — AB (ref 11.5–15.5)
WBC: 6 10*3/uL (ref 4.0–10.5)

## 2015-06-11 ENCOUNTER — Encounter: Payer: Self-pay | Admitting: Gastroenterology

## 2015-06-11 ENCOUNTER — Ambulatory Visit (AMBULATORY_SURGERY_CENTER): Payer: Medicare Other | Admitting: Gastroenterology

## 2015-06-11 VITALS — BP 115/74 | HR 80 | Temp 98.4°F | Resp 16 | Ht 62.0 in | Wt 176.0 lb

## 2015-06-11 DIAGNOSIS — Z1211 Encounter for screening for malignant neoplasm of colon: Secondary | ICD-10-CM

## 2015-06-11 MED ORDER — SODIUM CHLORIDE 0.9 % IV SOLN: 500.0000 mL | INTRAVENOUS | Status: DC

## 2015-06-11 NOTE — Progress Notes (Signed)
To recovery, report to Tyrell, RN, VSS. 

## 2015-06-11 NOTE — Patient Instructions (Addendum)
YOU HAD AN ENDOSCOPIC PROCEDURE TODAY AT THE [facility]:   Refer to the procedure report that was given to you for any specific questions about what was found during the examination.  If the procedure report does not answer your questions, please call your gastroenterologist to clarify.  If you requested that your care partner not be given the details of your procedure findings, then the procedure report has been included in a sealed envelope for you to review at your convenience later.  YOU SHOULD EXPECT: Some feelings of bloating in the abdomen. Passage of more gas than usual.  Walking can help get rid of the air that was put into your GI tract during the procedure and reduce the bloating. If you had a lower endoscopy (such as a colonoscopy or flexible sigmoidoscopy) you may notice spotting of blood in your stool or on the toilet paper. If you underwent a bowel prep for your procedure, you may not have a normal bowel movement for a few days.  Please Note:  You might notice some irritation and congestion in your nose or some drainage.  This is from the oxygen used during your procedure.  There is no need for concern and it should clear up in a day or so.  SYMPTOMS TO REPORT IMMEDIATELY:   Following lower endoscopy (colonoscopy or flexible sigmoidoscopy):  Excessive amounts of blood in the stool  Significant tenderness or worsening of abdominal pains  Swelling of the abdomen that is new, acute  Fever of 100F or higher   For urgent or emergent issues, a gastroenterologist can be reached at any hour by calling [phone].   DIET: Your first meal following the procedure should be a small meal and then it is ok to progress to your normal diet. Heavy or fried foods are harder to digest and may make you feel nauseous or bloated.  Likewise, meals heavy in dairy and vegetables can increase bloating.  Drink plenty of fluids but you should avoid alcoholic beverages for 24  hours.  ACTIVITY:  You should plan to take it easy for the rest of today and you should NOT DRIVE or use heavy machinery until tomorrow (because of the sedation medicines used during the test).    FOLLOW UP: Our staff will call the number listed on your records the next business day following your procedure to check on you and address any questions or concerns that you may have regarding the information given to you following your procedure. If we do not reach you, we will leave a message.  However, if you are feeling well and you are not experiencing any problems, there is no need to return our call.  We will assume that you have returned to your regular daily activities without incident.  If any biopsies were taken you will be contacted by phone or by letter within the next 1-3 weeks.  Please call us at [phone] if you have not heard about the biopsies in 3 weeks.    SIGNATURES/CONFIDENTIALITY: You and/or your care partner have signed paperwork which will be entered into your electronic medical record.  These signatures attest to the fact that that the information above on your After Visit Summary has been reviewed and is understood.  Full responsibility of the confidentiality of this discharge information lies with you and/or your care-partner. 

## 2015-06-11 NOTE — Op Note (Signed)
[facility] Patient Name: [name] Procedure Date: 06/11/2015 3:13 PM MRN: [id number] Endoscopist: Mallie Mussel L. Loletha Carrow , MD Age: 55 Date of Birth: 1960/11/18 Gender: Female Procedure:                Colonoscopy Indications:              Screening for colorectal malignant neoplasm Medicines:                Monitored Anesthesia Care Procedure:                Pre-Anesthesia Assessment:                           - Prior to the procedure, a History and Physical                            was performed, and patient medications and                            allergies were reviewed. The patient's tolerance of                            previous anesthesia was also reviewed. The risks                            and benefits of the procedure and the sedation                            options and risks were discussed with the patient.                            All questions were answered, and informed consent                            was obtained. Prior Anticoagulants: The patient has                            taken no previous anticoagulant or antiplatelet                            agents. ASA Grade Assessment: III - A patient with                            severe systemic disease. After reviewing the risks                            and benefits, the patient was deemed in                            satisfactory condition to undergo the procedure.                           After obtaining informed consent, the colonoscope  was passed under direct vision. Throughout the                            procedure, the patient's blood pressure, pulse, and                            oxygen saturations were monitored continuously. The                            Model PCF-H190L [id number]) scope was introduced                            through the anus and advanced to the the cecum,                            identified by appendiceal orifice and ileocecal                        valve. The colonoscopy was performed without                            difficulty. The patient tolerated the procedure                            well. The quality of the bowel preparation was                            good. The ileocecal valve, appendiceal orifice, and                            rectum were photographed. The quality of the bowel                            preparation was evaluated using the BBPS [facility]                            Bowel Preparation Scale) with scores of: Right                            Colon = 2, Transverse Colon = 3 and Left Colon = 2.                            The total BBPS score equals 7. The bowel                            preparation used was Miralax. Scope In: 3:36:35 PM Scope Out: 3:49:02 PM Scope Withdrawal Time: 0 hours 8 minutes 11 seconds  Total Procedure Duration: 0 hours 12 minutes 27 seconds  Findings:                 The perianal and digital rectal examinations were                            normal.  Multiple small-mouthed diverticula were found in                            the sigmoid colon.                           The exam was otherwise without abnormality on                            direct and retroflexion views.                           Internal hemorrhoids were found during                            retroflexion. The hemorrhoids were medium-sized and                            Grade I (internal hemorrhoids that do not prolapse). Complications:            No immediate complications. Estimated Blood Loss:     Estimated blood loss: none. Impression:               - Diverticulosis in the sigmoid colon.                           - The examination was otherwise normal on direct                            and retroflexion views.                           - Internal hemorrhoids.                           - No specimens collected. Recommendation:           - Patient has a contact  number available for                            emergencies. The signs and symptoms of potential                            delayed complications were discussed with the                            patient. Return to normal activities tomorrow.                            Written discharge instructions were provided to the                            patient.                           - Resume previous diet.                           -  Continue present medications.                           - Repeat colonoscopy in 10 years for screening                            purposes. Debar Plate L. Loletha Carrow, MD 06/11/2015 3:56:46 PM This report has been signed electronically.

## 2015-06-15 ENCOUNTER — Telehealth: Payer: Self-pay | Admitting: *Deleted

## 2015-06-15 NOTE — Telephone Encounter (Signed)
  Follow up Call-  Call back number 06/11/2015  Post procedure Call Back phone  # [phone] cell  Permission to leave phone message Yes     Patient questions:  Do you have a fever, pain , or abdominal swelling? No. Pain Score  0 *  Have you tolerated food without any problems? Yes.    Have you been able to return to your normal activities? Yes.    Do you have any questions about your discharge instructions: Diet   No. Medications  No. Follow up visit  No.  Do you have questions or concerns about your Care? No.  Actions: * If pain score is 4 or above: No action needed, pain <4.

## 2015-07-09 ENCOUNTER — Encounter (HOSPITAL_COMMUNITY)
Admission: RE | Admit: 2015-07-09 | Discharge: 2015-07-09 | Disposition: A | Discharge status: Home / Self Care | Payer: Medicare Other | Source: Ambulatory Visit | Attending: Nephrology | Admitting: Nephrology

## 2015-07-09 DIAGNOSIS — N184 Chronic kidney disease, stage 4 (severe): Secondary | ICD-10-CM | POA: Diagnosis not present

## 2015-07-09 DIAGNOSIS — Z940 Kidney transplant status: Secondary | ICD-10-CM | POA: Insufficient documentation

## 2015-07-09 DIAGNOSIS — D631 Anemia in chronic kidney disease: Secondary | ICD-10-CM | POA: Diagnosis present

## 2015-07-09 DIAGNOSIS — T451X5D Adverse effect of antineoplastic and immunosuppressive drugs, subsequent encounter: Secondary | ICD-10-CM

## 2015-07-09 LAB — IRON AND TIBC
IRON: 154 ug/dL (ref 28–170)
SATURATION RATIOS: 79 % — AB (ref 10.4–31.8)
TIBC: 195 ug/dL — AB (ref 250–450)
UIBC: 41 ug/dL

## 2015-07-09 LAB — RENAL FUNCTION PANEL
Albumin: 3.9 g/dL (ref 3.5–5.0)
Anion gap: 10 (ref 5–15)
BUN: 64 mg/dL — ABNORMAL HIGH (ref 6–20)
CHLORIDE: 114 mmol/L — AB (ref 101–111)
CO2: 20 mmol/L — ABNORMAL LOW (ref 22–32)
CREATININE: 5.85 mg/dL — AB (ref 0.44–1.00)
Calcium: 9.2 mg/dL (ref 8.9–10.3)
GFR, EST AFRICAN AMERICAN: 8 mL/min — AB (ref >60)
GFR, EST NON AFRICAN AMERICAN: 7 mL/min — AB (ref >60)
Glucose, Bld: 96 mg/dL (ref 65–99)
Phosphorus: 3.5 mg/dL (ref 2.5–4.6)
Potassium: 4.6 mmol/L (ref 3.5–5.1)
Sodium: 144 mmol/L (ref 135–145)

## 2015-07-09 LAB — POCT HEMOGLOBIN-HEMACUE: HEMOGLOBIN: 8.8 g/dL — AB (ref 12.0–15.0)

## 2015-07-09 LAB — FERRITIN: FERRITIN: 637 ng/mL — AB (ref 11–307)

## 2015-07-09 LAB — MAGNESIUM: MAGNESIUM: 1.9 mg/dL (ref 1.7–2.4)

## 2015-07-09 MED ORDER — DARBEPOETIN ALFA 150 MCG/0.3ML IJ SOSY
PREFILLED_SYRINGE | INTRAMUSCULAR | Status: AC
  Filled 2015-07-09: qty 0.3

## 2015-07-09 MED ORDER — DARBEPOETIN ALFA 300 MCG/0.6ML IJ SOSY
300.0000 ug | PREFILLED_SYRINGE | INTRAMUSCULAR | Status: DC
  Administered 2015-07-09: 300 ug via SUBCUTANEOUS
  Filled 2015-07-09: qty 0.6

## 2015-07-10 ENCOUNTER — Encounter (HOSPITAL_COMMUNITY): Payer: Medicare Other

## 2015-07-10 LAB — TACROLIMUS LEVEL: TACROLIMUS (FK506) - LABCORP: 2.8 ng/mL (ref 2.0–20.0)

## 2015-07-20 ENCOUNTER — Other Ambulatory Visit (HOSPITAL_COMMUNITY): Payer: Self-pay | Admitting: *Deleted

## 2015-07-21 ENCOUNTER — Encounter (HOSPITAL_COMMUNITY)
Admission: RE | Admit: 2015-07-21 | Discharge: 2015-07-21 | Disposition: A | Discharge status: Home / Self Care | Payer: Medicare Other | Source: Ambulatory Visit | Attending: Nephrology | Admitting: Nephrology

## 2015-07-21 DIAGNOSIS — D631 Anemia in chronic kidney disease: Secondary | ICD-10-CM | POA: Diagnosis not present

## 2015-07-21 DIAGNOSIS — N184 Chronic kidney disease, stage 4 (severe): Secondary | ICD-10-CM

## 2015-07-21 DIAGNOSIS — T451X5D Adverse effect of antineoplastic and immunosuppressive drugs, subsequent encounter: Secondary | ICD-10-CM

## 2015-07-21 LAB — POCT HEMOGLOBIN-HEMACUE: HEMOGLOBIN: 8.3 g/dL — AB (ref 12.0–15.0)

## 2015-07-21 MED ORDER — DARBEPOETIN ALFA 100 MCG/0.5ML IJ SOSY
PREFILLED_SYRINGE | INTRAMUSCULAR | Status: AC
  Filled 2015-07-21: qty 0.5

## 2015-07-21 MED ORDER — DARBEPOETIN ALFA 200 MCG/0.4ML IJ SOSY
PREFILLED_SYRINGE | INTRAMUSCULAR | Status: AC
  Filled 2015-07-21: qty 0.4

## 2015-07-21 MED ORDER — DARBEPOETIN ALFA 300 MCG/0.6ML IJ SOSY
300.0000 ug | PREFILLED_SYRINGE | INTRAMUSCULAR | Status: DC
  Administered 2015-07-21: 300 ug via SUBCUTANEOUS
  Filled 2015-07-21: qty 0.6

## 2015-07-22 MED FILL — Darbepoetin Alfa Soln Prefilled Syringe 200 MCG/0.4ML: INTRAMUSCULAR | Qty: 0.4 | Status: AC

## 2015-07-22 MED FILL — Darbepoetin Alfa Soln Prefilled Syringe 100 MCG/0.5ML: INTRAMUSCULAR | Qty: 0.5 | Status: AC

## 2015-08-11 ENCOUNTER — Encounter (HOSPITAL_COMMUNITY)
Admission: RE | Admit: 2015-08-11 | Discharge: 2015-08-11 | Disposition: A | Discharge status: Home / Self Care | Payer: Medicare Other | Source: Ambulatory Visit | Attending: Nephrology | Admitting: Nephrology

## 2015-08-11 DIAGNOSIS — D631 Anemia in chronic kidney disease: Secondary | ICD-10-CM | POA: Diagnosis not present

## 2015-08-11 DIAGNOSIS — N184 Chronic kidney disease, stage 4 (severe): Secondary | ICD-10-CM | POA: Diagnosis not present

## 2015-08-11 DIAGNOSIS — Z940 Kidney transplant status: Secondary | ICD-10-CM | POA: Insufficient documentation

## 2015-08-11 DIAGNOSIS — T451X5D Adverse effect of antineoplastic and immunosuppressive drugs, subsequent encounter: Secondary | ICD-10-CM

## 2015-08-11 LAB — RENAL FUNCTION PANEL
ALBUMIN: 3.6 g/dL (ref 3.5–5.0)
Anion gap: 7 (ref 5–15)
BUN: 51 mg/dL — ABNORMAL HIGH (ref 6–20)
CALCIUM: 9 mg/dL (ref 8.9–10.3)
CO2: 23 mmol/L (ref 22–32)
CREATININE: 5.11 mg/dL — AB (ref 0.44–1.00)
Chloride: 111 mmol/L (ref 101–111)
GFR calc non Af Amer: 9 mL/min — ABNORMAL LOW (ref >60)
GFR, EST AFRICAN AMERICAN: 10 mL/min — AB (ref >60)
GLUCOSE: 104 mg/dL — AB (ref 65–99)
PHOSPHORUS: 3.4 mg/dL (ref 2.5–4.6)
Potassium: 4.7 mmol/L (ref 3.5–5.1)
SODIUM: 141 mmol/L (ref 135–145)

## 2015-08-11 LAB — IRON AND TIBC
Iron: 72 ug/dL (ref 28–170)
SATURATION RATIOS: 33 % — AB (ref 10.4–31.8)
TIBC: 217 ug/dL — AB (ref 250–450)
UIBC: 145 ug/dL

## 2015-08-11 LAB — POCT HEMOGLOBIN-HEMACUE: Hemoglobin: 9.1 g/dL — ABNORMAL LOW (ref 12.0–15.0)

## 2015-08-11 LAB — MAGNESIUM: Magnesium: 1.9 mg/dL (ref 1.7–2.4)

## 2015-08-11 LAB — FERRITIN: FERRITIN: 554 ng/mL — AB (ref 11–307)

## 2015-08-11 MED ORDER — DARBEPOETIN ALFA 100 MCG/0.5ML IJ SOSY
PREFILLED_SYRINGE | INTRAMUSCULAR | Status: AC
  Administered 2015-08-11: 100 ug via SUBCUTANEOUS
  Filled 2015-08-11: qty 0.5

## 2015-08-11 MED ORDER — DARBEPOETIN ALFA 300 MCG/0.6ML IJ SOSY
300.0000 ug | PREFILLED_SYRINGE | INTRAMUSCULAR | Status: DC
  Filled 2015-08-11: qty 0.6

## 2015-08-11 MED ORDER — DARBEPOETIN ALFA 200 MCG/0.4ML IJ SOSY
PREFILLED_SYRINGE | INTRAMUSCULAR | Status: AC
  Administered 2015-08-11: 200 ug via SUBCUTANEOUS
  Filled 2015-08-11: qty 0.4

## 2015-08-25 ENCOUNTER — Encounter (HOSPITAL_COMMUNITY)
Admission: RE | Admit: 2015-08-25 | Discharge: 2015-08-25 | Disposition: A | Discharge status: Home / Self Care | Payer: Medicare Other | Source: Ambulatory Visit | Attending: Nephrology | Admitting: Nephrology

## 2015-08-25 DIAGNOSIS — D631 Anemia in chronic kidney disease: Secondary | ICD-10-CM | POA: Diagnosis not present

## 2015-08-25 DIAGNOSIS — T451X5D Adverse effect of antineoplastic and immunosuppressive drugs, subsequent encounter: Secondary | ICD-10-CM

## 2015-08-25 DIAGNOSIS — N184 Chronic kidney disease, stage 4 (severe): Secondary | ICD-10-CM

## 2015-08-25 LAB — POCT HEMOGLOBIN-HEMACUE: Hemoglobin: 9.4 g/dL — ABNORMAL LOW (ref 12.0–15.0)

## 2015-08-25 MED ORDER — DARBEPOETIN ALFA 200 MCG/0.4ML IJ SOSY
PREFILLED_SYRINGE | INTRAMUSCULAR | Status: AC
  Administered 2015-08-25: 200 ug via SUBCUTANEOUS
  Filled 2015-08-25: qty 0.4

## 2015-08-25 MED ORDER — DARBEPOETIN ALFA 100 MCG/0.5ML IJ SOSY
PREFILLED_SYRINGE | INTRAMUSCULAR | Status: AC
  Administered 2015-08-25: 100 ug via SUBCUTANEOUS
  Filled 2015-08-25: qty 0.5

## 2015-08-25 MED ORDER — DARBEPOETIN ALFA 300 MCG/0.6ML IJ SOSY
300.0000 ug | PREFILLED_SYRINGE | INTRAMUSCULAR | Status: DC
  Filled 2015-08-25: qty 0.6

## 2015-08-26 LAB — TACROLIMUS LEVEL: Tacrolimus (FK506) - LabCorp: 3 ng/mL (ref 2.0–20.0)

## 2015-09-08 ENCOUNTER — Encounter (HOSPITAL_COMMUNITY)
Admission: RE | Admit: 2015-09-08 | Discharge: 2015-09-08 | Disposition: A | Discharge status: Home / Self Care | Payer: Medicare Other | Source: Ambulatory Visit | Attending: Nephrology | Admitting: Nephrology

## 2015-09-08 DIAGNOSIS — Z940 Kidney transplant status: Secondary | ICD-10-CM | POA: Insufficient documentation

## 2015-09-08 DIAGNOSIS — T451X5D Adverse effect of antineoplastic and immunosuppressive drugs, subsequent encounter: Secondary | ICD-10-CM

## 2015-09-08 DIAGNOSIS — N184 Chronic kidney disease, stage 4 (severe): Secondary | ICD-10-CM | POA: Diagnosis not present

## 2015-09-08 DIAGNOSIS — D631 Anemia in chronic kidney disease: Secondary | ICD-10-CM | POA: Diagnosis not present

## 2015-09-08 LAB — RENAL FUNCTION PANEL
ALBUMIN: 3.9 g/dL (ref 3.5–5.0)
Anion gap: 8 (ref 5–15)
BUN: 58 mg/dL — AB (ref 6–20)
CALCIUM: 8.9 mg/dL (ref 8.9–10.3)
CO2: 20 mmol/L — ABNORMAL LOW (ref 22–32)
CREATININE: 5.55 mg/dL — AB (ref 0.44–1.00)
Chloride: 109 mmol/L (ref 101–111)
GFR, EST AFRICAN AMERICAN: 9 mL/min — AB (ref >60)
GFR, EST NON AFRICAN AMERICAN: 8 mL/min — AB (ref >60)
Glucose, Bld: 104 mg/dL — ABNORMAL HIGH (ref 65–99)
PHOSPHORUS: 3.7 mg/dL (ref 2.5–4.6)
Potassium: 4.8 mmol/L (ref 3.5–5.1)
Sodium: 137 mmol/L (ref 135–145)

## 2015-09-08 LAB — IRON AND TIBC
IRON: 53 ug/dL (ref 28–170)
Saturation Ratios: 23 % (ref 10.4–31.8)
TIBC: 227 ug/dL — ABNORMAL LOW (ref 250–450)
UIBC: 174 ug/dL

## 2015-09-08 LAB — MAGNESIUM: MAGNESIUM: 2.1 mg/dL (ref 1.7–2.4)

## 2015-09-08 LAB — POCT HEMOGLOBIN-HEMACUE: HEMOGLOBIN: 10.4 g/dL — AB (ref 12.0–15.0)

## 2015-09-08 LAB — FERRITIN: FERRITIN: 579 ng/mL — AB (ref 11–307)

## 2015-09-08 MED ORDER — DARBEPOETIN ALFA 300 MCG/0.6ML IJ SOSY
300.0000 ug | PREFILLED_SYRINGE | INTRAMUSCULAR | Status: DC
  Administered 2015-09-08 (×2): 150 ug via SUBCUTANEOUS
  Filled 2015-09-08: qty 0.6

## 2015-09-08 MED ORDER — DARBEPOETIN ALFA 150 MCG/0.3ML IJ SOSY
PREFILLED_SYRINGE | INTRAMUSCULAR | Status: AC
  Filled 2015-09-08: qty 0.6

## 2015-09-09 LAB — PTH, INTACT AND CALCIUM
CALCIUM TOTAL (PTH): 8.7 mg/dL (ref 8.7–10.2)
PTH: 103 pg/mL — AB (ref 15–65)

## 2015-09-16 IMAGING — CR DG CHEST 2V
1 series · 1 of 1 positions shown · non-contrast
Comparison: DG CHEST 1V PORT dated 09/03/2010

CLINICAL DATA: Weakness, diarrhea and right-sided chest pain.
History of stroke and chronic kidney disease.

EXAM:
CHEST - 2 VIEW

[w chest lat]
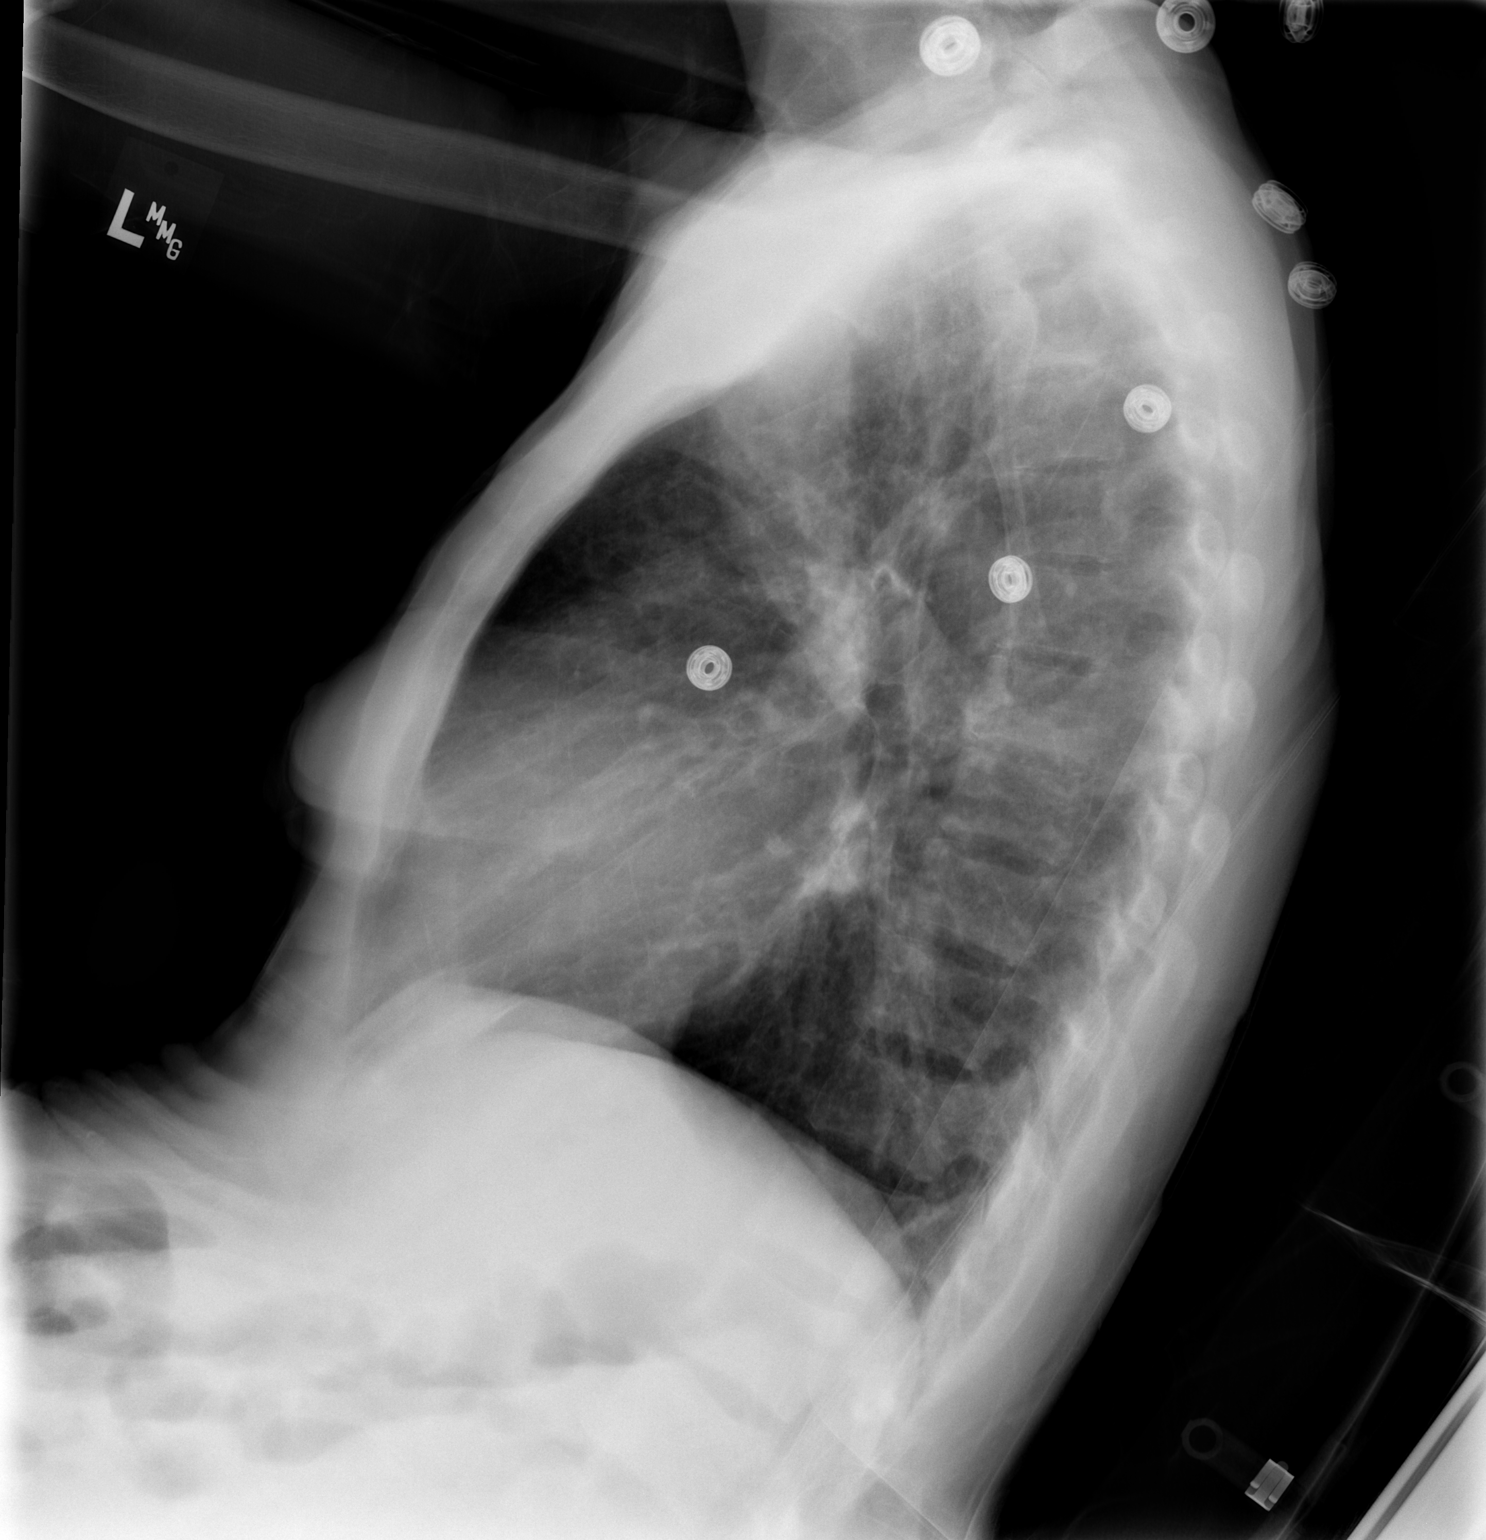

[1 of 1 positions shown; findings below may reference images not displayed]

FINDINGS: The heart is mildly enlarged. There is no evidence of pulmonary
edema, consolidation, pneumothorax, nodule or pleural fluid. Lateral
projection shows a watch diet appearance of the thoracic vertebral
bodies suggestive of renal osteodystrophy. No fractures are
identified.
IMPRESSION: No acute findings. Evidence of probable renal osteodystrophy of the
spine.

## 2015-09-22 ENCOUNTER — Encounter (HOSPITAL_COMMUNITY): Payer: Medicare Other

## 2015-09-24 ENCOUNTER — Encounter (HOSPITAL_COMMUNITY)
Admission: RE | Admit: 2015-09-24 | Discharge: 2015-09-24 | Disposition: A | Discharge status: Home / Self Care | Payer: Medicare Other | Source: Ambulatory Visit | Attending: Nephrology | Admitting: Nephrology

## 2015-09-24 DIAGNOSIS — N184 Chronic kidney disease, stage 4 (severe): Secondary | ICD-10-CM

## 2015-09-24 DIAGNOSIS — T451X5D Adverse effect of antineoplastic and immunosuppressive drugs, subsequent encounter: Secondary | ICD-10-CM

## 2015-09-24 DIAGNOSIS — D631 Anemia in chronic kidney disease: Secondary | ICD-10-CM | POA: Diagnosis not present

## 2015-09-24 LAB — POCT HEMOGLOBIN-HEMACUE: HEMOGLOBIN: 11.4 g/dL — AB (ref 12.0–15.0)

## 2015-09-24 MED ORDER — DARBEPOETIN ALFA 300 MCG/0.6ML IJ SOSY
300.0000 ug | PREFILLED_SYRINGE | INTRAMUSCULAR | Status: DC
  Filled 2015-09-24: qty 0.6

## 2015-09-24 MED ORDER — DARBEPOETIN ALFA 150 MCG/0.3ML IJ SOSY
PREFILLED_SYRINGE | INTRAMUSCULAR | Status: AC
  Administered 2015-09-24: 300 ug via SUBCUTANEOUS
  Filled 2015-09-24: qty 0.6

## 2015-09-25 LAB — TACROLIMUS LEVEL: TACROLIMUS (FK506) - LABCORP: 4.1 ng/mL (ref 2.0–20.0)

## 2015-10-08 ENCOUNTER — Encounter (HOSPITAL_COMMUNITY): Payer: Medicare Other

## 2015-10-13 ENCOUNTER — Encounter (HOSPITAL_COMMUNITY)
Admission: RE | Admit: 2015-10-13 | Discharge: 2015-10-13 | Disposition: A | Discharge status: Home / Self Care | Payer: Medicare Other | Source: Ambulatory Visit | Attending: Nephrology | Admitting: Nephrology

## 2015-10-13 DIAGNOSIS — T451X5D Adverse effect of antineoplastic and immunosuppressive drugs, subsequent encounter: Secondary | ICD-10-CM

## 2015-10-13 DIAGNOSIS — N184 Chronic kidney disease, stage 4 (severe): Secondary | ICD-10-CM | POA: Insufficient documentation

## 2015-10-13 DIAGNOSIS — Z940 Kidney transplant status: Secondary | ICD-10-CM | POA: Insufficient documentation

## 2015-10-13 DIAGNOSIS — D631 Anemia in chronic kidney disease: Secondary | ICD-10-CM | POA: Insufficient documentation

## 2015-10-13 LAB — IRON AND TIBC
IRON: 77 ug/dL (ref 28–170)
SATURATION RATIOS: 33 % — AB (ref 10.4–31.8)
TIBC: 235 ug/dL — ABNORMAL LOW (ref 250–450)
UIBC: 158 ug/dL

## 2015-10-13 LAB — RENAL FUNCTION PANEL
ALBUMIN: 3.9 g/dL (ref 3.5–5.0)
Anion gap: 7 (ref 5–15)
BUN: 54 mg/dL — AB (ref 6–20)
CHLORIDE: 111 mmol/L (ref 101–111)
CO2: 20 mmol/L — ABNORMAL LOW (ref 22–32)
CREATININE: 5.15 mg/dL — AB (ref 0.44–1.00)
Calcium: 9 mg/dL (ref 8.9–10.3)
GFR calc Af Amer: 10 mL/min — ABNORMAL LOW (ref >60)
GFR, EST NON AFRICAN AMERICAN: 9 mL/min — AB (ref >60)
GLUCOSE: 101 mg/dL — AB (ref 65–99)
POTASSIUM: 4.7 mmol/L (ref 3.5–5.1)
Phosphorus: 4.1 mg/dL (ref 2.5–4.6)
Sodium: 138 mmol/L (ref 135–145)

## 2015-10-13 LAB — POCT HEMOGLOBIN-HEMACUE: Hemoglobin: 12.6 g/dL (ref 12.0–15.0)

## 2015-10-13 LAB — MAGNESIUM: Magnesium: 2.2 mg/dL (ref 1.7–2.4)

## 2015-10-13 LAB — FERRITIN: FERRITIN: 603 ng/mL — AB (ref 11–307)

## 2015-10-13 MED ORDER — DARBEPOETIN ALFA 300 MCG/0.6ML IJ SOSY
300.0000 ug | PREFILLED_SYRINGE | INTRAMUSCULAR | Status: DC
  Filled 2015-10-13: qty 0.6

## 2015-10-27 ENCOUNTER — Inpatient Hospital Stay (HOSPITAL_COMMUNITY)
Admission: RE | Admit: 2015-10-27 | Discharge: 2015-10-27 | Disposition: A | Discharge status: Home / Self Care | Payer: Medicare Other | Source: Ambulatory Visit | Attending: Nephrology | Admitting: Nephrology

## 2015-11-05 ENCOUNTER — Encounter (HOSPITAL_COMMUNITY)
Admission: RE | Admit: 2015-11-05 | Discharge: 2015-11-05 | Disposition: A | Discharge status: Home / Self Care | Payer: Medicare Other | Source: Ambulatory Visit | Attending: Nephrology | Admitting: Nephrology

## 2015-11-05 DIAGNOSIS — T451X5D Adverse effect of antineoplastic and immunosuppressive drugs, subsequent encounter: Secondary | ICD-10-CM

## 2015-11-05 DIAGNOSIS — N184 Chronic kidney disease, stage 4 (severe): Secondary | ICD-10-CM | POA: Insufficient documentation

## 2015-11-05 DIAGNOSIS — Z940 Kidney transplant status: Secondary | ICD-10-CM | POA: Insufficient documentation

## 2015-11-05 DIAGNOSIS — D631 Anemia in chronic kidney disease: Secondary | ICD-10-CM | POA: Diagnosis not present

## 2015-11-05 LAB — POCT HEMOGLOBIN-HEMACUE: Hemoglobin: 11.4 g/dL — ABNORMAL LOW (ref 12.0–15.0)

## 2015-11-05 MED ORDER — DARBEPOETIN ALFA 200 MCG/0.4ML IJ SOSY
PREFILLED_SYRINGE | INTRAMUSCULAR | Status: AC
  Filled 2015-11-05: qty 0.4

## 2015-11-05 MED ORDER — DARBEPOETIN ALFA 300 MCG/0.6ML IJ SOSY
300.0000 ug | PREFILLED_SYRINGE | INTRAMUSCULAR | Status: DC
  Administered 2015-11-05: 200 ug via SUBCUTANEOUS
  Filled 2015-11-05: qty 0.6

## 2015-11-05 MED ORDER — DARBEPOETIN ALFA 100 MCG/0.5ML IJ SOSY
PREFILLED_SYRINGE | INTRAMUSCULAR | Status: AC
  Administered 2015-11-05: 100 ug
  Filled 2015-11-05: qty 0.5

## 2015-11-07 LAB — TACROLIMUS LEVEL: Tacrolimus (FK506) - LabCorp: 4.6 ng/mL (ref 2.0–20.0)

## 2015-11-24 ENCOUNTER — Encounter (HOSPITAL_COMMUNITY)
Admission: RE | Admit: 2015-11-24 | Discharge: 2015-11-24 | Disposition: A | Discharge status: Home / Self Care | Payer: Medicare Other | Source: Ambulatory Visit | Attending: Nephrology | Admitting: Nephrology

## 2015-11-24 DIAGNOSIS — D631 Anemia in chronic kidney disease: Secondary | ICD-10-CM | POA: Diagnosis not present

## 2015-11-24 DIAGNOSIS — N184 Chronic kidney disease, stage 4 (severe): Secondary | ICD-10-CM

## 2015-11-24 DIAGNOSIS — T451X5D Adverse effect of antineoplastic and immunosuppressive drugs, subsequent encounter: Secondary | ICD-10-CM

## 2015-11-24 LAB — MAGNESIUM: MAGNESIUM: 2.2 mg/dL (ref 1.7–2.4)

## 2015-11-24 LAB — IRON AND TIBC
Iron: 72 ug/dL (ref 28–170)
Saturation Ratios: 32 % — ABNORMAL HIGH (ref 10.4–31.8)
TIBC: 224 ug/dL — ABNORMAL LOW (ref 250–450)
UIBC: 152 ug/dL

## 2015-11-24 LAB — RENAL FUNCTION PANEL
ALBUMIN: 4 g/dL (ref 3.5–5.0)
Anion gap: 8 (ref 5–15)
BUN: 43 mg/dL — ABNORMAL HIGH (ref 6–20)
CALCIUM: 9.3 mg/dL (ref 8.9–10.3)
CHLORIDE: 110 mmol/L (ref 101–111)
CO2: 22 mmol/L (ref 22–32)
CREATININE: 4.77 mg/dL — AB (ref 0.44–1.00)
GFR, EST AFRICAN AMERICAN: 11 mL/min — AB (ref >60)
GFR, EST NON AFRICAN AMERICAN: 9 mL/min — AB (ref >60)
Glucose, Bld: 92 mg/dL (ref 65–99)
PHOSPHORUS: 3.8 mg/dL (ref 2.5–4.6)
Potassium: 4.6 mmol/L (ref 3.5–5.1)
Sodium: 140 mmol/L (ref 135–145)

## 2015-11-24 LAB — FERRITIN: Ferritin: 551 ng/mL — ABNORMAL HIGH (ref 11–307)

## 2015-11-24 LAB — POCT HEMOGLOBIN-HEMACUE: HEMOGLOBIN: 10.9 g/dL — AB (ref 12.0–15.0)

## 2015-11-24 MED ORDER — DARBEPOETIN ALFA 200 MCG/0.4ML IJ SOSY
PREFILLED_SYRINGE | INTRAMUSCULAR | Status: AC
  Administered 2015-11-24: 200 ug
  Filled 2015-11-24: qty 0.4

## 2015-11-24 MED ORDER — DARBEPOETIN ALFA 300 MCG/0.6ML IJ SOSY
300.0000 ug | PREFILLED_SYRINGE | INTRAMUSCULAR | Status: DC
  Filled 2015-11-24: qty 0.6

## 2015-11-24 MED ORDER — DARBEPOETIN ALFA 100 MCG/0.5ML IJ SOSY
PREFILLED_SYRINGE | INTRAMUSCULAR | Status: AC
  Administered 2015-11-24: 100 ug via SUBCUTANEOUS
  Filled 2015-11-24: qty 0.5

## 2015-12-08 ENCOUNTER — Encounter (HOSPITAL_COMMUNITY)
Admission: RE | Admit: 2015-12-08 | Discharge: 2015-12-08 | Disposition: A | Discharge status: Home / Self Care | Payer: Medicare Other | Source: Ambulatory Visit | Attending: Nephrology | Admitting: Nephrology

## 2015-12-08 DIAGNOSIS — D631 Anemia in chronic kidney disease: Secondary | ICD-10-CM | POA: Diagnosis not present

## 2015-12-08 DIAGNOSIS — Z940 Kidney transplant status: Secondary | ICD-10-CM | POA: Insufficient documentation

## 2015-12-08 DIAGNOSIS — N184 Chronic kidney disease, stage 4 (severe): Secondary | ICD-10-CM | POA: Diagnosis not present

## 2015-12-08 DIAGNOSIS — T451X5D Adverse effect of antineoplastic and immunosuppressive drugs, subsequent encounter: Secondary | ICD-10-CM

## 2015-12-08 LAB — POCT HEMOGLOBIN-HEMACUE: Hemoglobin: 11.8 g/dL — ABNORMAL LOW (ref 12.0–15.0)

## 2015-12-08 MED ORDER — DARBEPOETIN ALFA 300 MCG/0.6ML IJ SOSY
300.0000 ug | PREFILLED_SYRINGE | INTRAMUSCULAR | Status: DC
  Filled 2015-12-08: qty 0.6

## 2015-12-09 LAB — PTH, INTACT AND CALCIUM
CALCIUM TOTAL (PTH): 8.8 mg/dL (ref 8.7–10.2)
PTH: 379 pg/mL — AB (ref 15–65)

## 2015-12-09 LAB — TACROLIMUS LEVEL: TACROLIMUS (FK506) - LABCORP: 5.2 ng/mL (ref 2.0–20.0)

## 2015-12-29 ENCOUNTER — Encounter (HOSPITAL_COMMUNITY)
Admission: RE | Admit: 2015-12-29 | Discharge: 2015-12-29 | Disposition: A | Discharge status: Home / Self Care | Payer: Medicare Other | Source: Ambulatory Visit | Attending: Nephrology | Admitting: Nephrology

## 2015-12-29 DIAGNOSIS — N184 Chronic kidney disease, stage 4 (severe): Secondary | ICD-10-CM

## 2015-12-29 DIAGNOSIS — T451X5D Adverse effect of antineoplastic and immunosuppressive drugs, subsequent encounter: Secondary | ICD-10-CM

## 2015-12-29 DIAGNOSIS — D631 Anemia in chronic kidney disease: Secondary | ICD-10-CM | POA: Diagnosis not present

## 2015-12-29 LAB — IRON AND TIBC
IRON: 162 ug/dL (ref 28–170)
Saturation Ratios: 79 % — ABNORMAL HIGH (ref 10.4–31.8)
TIBC: 206 ug/dL — ABNORMAL LOW (ref 250–450)
UIBC: 44 ug/dL

## 2015-12-29 LAB — MAGNESIUM: Magnesium: 2.3 mg/dL (ref 1.7–2.4)

## 2015-12-29 LAB — FERRITIN: Ferritin: 649 ng/mL — ABNORMAL HIGH (ref 11–307)

## 2015-12-29 LAB — POCT HEMOGLOBIN-HEMACUE: HEMOGLOBIN: 10.9 g/dL — AB (ref 12.0–15.0)

## 2015-12-29 MED ORDER — DARBEPOETIN ALFA 100 MCG/0.5ML IJ SOSY
PREFILLED_SYRINGE | INTRAMUSCULAR | Status: AC
  Administered 2015-12-29: 100 ug via SUBCUTANEOUS
  Filled 2015-12-29: qty 0.5

## 2015-12-29 MED ORDER — DARBEPOETIN ALFA 200 MCG/0.4ML IJ SOSY
PREFILLED_SYRINGE | INTRAMUSCULAR | Status: AC
  Filled 2015-12-29: qty 0.4

## 2015-12-29 MED ORDER — DARBEPOETIN ALFA 300 MCG/0.6ML IJ SOSY
300.0000 ug | PREFILLED_SYRINGE | INTRAMUSCULAR | Status: DC
  Administered 2015-12-29: 200 ug via SUBCUTANEOUS
  Filled 2015-12-29: qty 0.6

## 2016-01-11 ENCOUNTER — Other Ambulatory Visit (HOSPITAL_COMMUNITY): Payer: Self-pay | Admitting: *Deleted

## 2016-01-12 ENCOUNTER — Encounter (HOSPITAL_COMMUNITY)
Admission: RE | Admit: 2016-01-12 | Discharge: 2016-01-12 | Disposition: A | Discharge status: Home / Self Care | Payer: Medicare Other | Source: Ambulatory Visit | Attending: Nephrology | Admitting: Nephrology

## 2016-01-12 DIAGNOSIS — T451X5D Adverse effect of antineoplastic and immunosuppressive drugs, subsequent encounter: Secondary | ICD-10-CM

## 2016-01-12 DIAGNOSIS — Z940 Kidney transplant status: Secondary | ICD-10-CM | POA: Diagnosis not present

## 2016-01-12 DIAGNOSIS — D631 Anemia in chronic kidney disease: Secondary | ICD-10-CM | POA: Insufficient documentation

## 2016-01-12 DIAGNOSIS — N184 Chronic kidney disease, stage 4 (severe): Secondary | ICD-10-CM | POA: Diagnosis not present

## 2016-01-12 LAB — POCT HEMOGLOBIN-HEMACUE: HEMOGLOBIN: 10 g/dL — AB (ref 12.0–15.0)

## 2016-01-12 MED ORDER — DARBEPOETIN ALFA 150 MCG/0.3ML IJ SOSY
300.0000 ug | PREFILLED_SYRINGE | INTRAMUSCULAR | Status: DC
  Administered 2016-01-12: 300 ug via SUBCUTANEOUS

## 2016-01-12 MED ORDER — DARBEPOETIN ALFA 150 MCG/0.3ML IJ SOSY
PREFILLED_SYRINGE | INTRAMUSCULAR | Status: AC
  Administered 2016-01-12: 300 ug via SUBCUTANEOUS
  Filled 2016-01-12: qty 0.6

## 2016-02-02 ENCOUNTER — Encounter (HOSPITAL_COMMUNITY)
Admission: RE | Admit: 2016-02-02 | Discharge: 2016-02-02 | Disposition: A | Discharge status: Home / Self Care | Payer: Medicare Other | Source: Ambulatory Visit | Attending: Nephrology | Admitting: Nephrology

## 2016-02-02 DIAGNOSIS — T451X5D Adverse effect of antineoplastic and immunosuppressive drugs, subsequent encounter: Secondary | ICD-10-CM

## 2016-02-02 DIAGNOSIS — Z940 Kidney transplant status: Secondary | ICD-10-CM | POA: Insufficient documentation

## 2016-02-02 DIAGNOSIS — D631 Anemia in chronic kidney disease: Secondary | ICD-10-CM | POA: Diagnosis not present

## 2016-02-02 DIAGNOSIS — N184 Chronic kidney disease, stage 4 (severe): Secondary | ICD-10-CM | POA: Insufficient documentation

## 2016-02-02 LAB — RENAL FUNCTION PANEL
ANION GAP: 9 (ref 5–15)
Albumin: 3.7 g/dL (ref 3.5–5.0)
BUN: 47 mg/dL — ABNORMAL HIGH (ref 6–20)
CALCIUM: 8.5 mg/dL — AB (ref 8.9–10.3)
CO2: 20 mmol/L — AB (ref 22–32)
CREATININE: 4.8 mg/dL — AB (ref 0.44–1.00)
Chloride: 113 mmol/L — ABNORMAL HIGH (ref 101–111)
GFR, EST AFRICAN AMERICAN: 11 mL/min — AB (ref >60)
GFR, EST NON AFRICAN AMERICAN: 9 mL/min — AB (ref >60)
Glucose, Bld: 97 mg/dL (ref 65–99)
Phosphorus: 4.2 mg/dL (ref 2.5–4.6)
Potassium: 4.6 mmol/L (ref 3.5–5.1)
SODIUM: 142 mmol/L (ref 135–145)

## 2016-02-02 LAB — IRON AND TIBC
IRON: 69 ug/dL (ref 28–170)
Saturation Ratios: 31 % (ref 10.4–31.8)
TIBC: 220 ug/dL — AB (ref 250–450)
UIBC: 151 ug/dL

## 2016-02-02 LAB — FERRITIN: Ferritin: 496 ng/mL — ABNORMAL HIGH (ref 11–307)

## 2016-02-02 LAB — POCT HEMOGLOBIN-HEMACUE: HEMOGLOBIN: 10.2 g/dL — AB (ref 12.0–15.0)

## 2016-02-02 MED ORDER — DARBEPOETIN ALFA 150 MCG/0.3ML IJ SOSY
PREFILLED_SYRINGE | INTRAMUSCULAR | Status: AC
  Administered 2016-02-02: 300 ug via SUBCUTANEOUS
  Filled 2016-02-02: qty 0.6

## 2016-02-02 MED ORDER — DARBEPOETIN ALFA 300 MCG/0.6ML IJ SOSY
300.0000 ug | PREFILLED_SYRINGE | INTRAMUSCULAR | Status: DC
  Filled 2016-02-02: qty 0.6

## 2016-02-25 ENCOUNTER — Encounter (HOSPITAL_COMMUNITY)
Admission: RE | Admit: 2016-02-25 | Discharge: 2016-02-25 | Disposition: A | Discharge status: Home / Self Care | Payer: Medicare Other | Source: Ambulatory Visit | Attending: Nephrology | Admitting: Nephrology

## 2016-02-25 DIAGNOSIS — N184 Chronic kidney disease, stage 4 (severe): Secondary | ICD-10-CM

## 2016-02-25 DIAGNOSIS — D631 Anemia in chronic kidney disease: Secondary | ICD-10-CM | POA: Diagnosis not present

## 2016-02-25 DIAGNOSIS — T451X5D Adverse effect of antineoplastic and immunosuppressive drugs, subsequent encounter: Secondary | ICD-10-CM

## 2016-02-25 LAB — POCT HEMOGLOBIN-HEMACUE: HEMOGLOBIN: 10.7 g/dL — AB (ref 12.0–15.0)

## 2016-02-25 MED ORDER — DARBEPOETIN ALFA 150 MCG/0.3ML IJ SOSY
PREFILLED_SYRINGE | INTRAMUSCULAR | Status: AC
  Filled 2016-02-25: qty 0.6

## 2016-02-25 MED ORDER — DARBEPOETIN ALFA 300 MCG/0.6ML IJ SOSY
300.0000 ug | PREFILLED_SYRINGE | INTRAMUSCULAR | Status: DC
  Administered 2016-02-25: 300 ug via SUBCUTANEOUS
  Filled 2016-02-25: qty 0.6

## 2016-03-17 ENCOUNTER — Encounter (HOSPITAL_COMMUNITY): Payer: Medicare Other

## 2016-04-04 ENCOUNTER — Other Ambulatory Visit (HOSPITAL_COMMUNITY): Payer: Self-pay | Admitting: *Deleted

## 2016-04-05 ENCOUNTER — Inpatient Hospital Stay (HOSPITAL_COMMUNITY): Admission: RE | Admit: 2016-04-05 | Payer: Medicare Other | Source: Ambulatory Visit

## 2016-04-26 ENCOUNTER — Encounter (HOSPITAL_COMMUNITY): Payer: Medicare Other

## 2016-05-05 ENCOUNTER — Encounter (HOSPITAL_COMMUNITY)
Admission: RE | Admit: 2016-05-05 | Discharge: 2016-05-05 | Disposition: A | Discharge status: Home / Self Care | Payer: Medicare HMO | Source: Ambulatory Visit | Attending: Nephrology | Admitting: Nephrology

## 2016-05-05 DIAGNOSIS — T451X5D Adverse effect of antineoplastic and immunosuppressive drugs, subsequent encounter: Secondary | ICD-10-CM

## 2016-05-05 DIAGNOSIS — Z940 Kidney transplant status: Secondary | ICD-10-CM | POA: Diagnosis not present

## 2016-05-05 DIAGNOSIS — D631 Anemia in chronic kidney disease: Secondary | ICD-10-CM | POA: Diagnosis not present

## 2016-05-05 DIAGNOSIS — N184 Chronic kidney disease, stage 4 (severe): Secondary | ICD-10-CM | POA: Insufficient documentation

## 2016-05-05 LAB — IRON AND TIBC
Iron: 170 ug/dL (ref 28–170)
Saturation Ratios: 82 % — ABNORMAL HIGH (ref 10.4–31.8)
TIBC: 207 ug/dL — AB (ref 250–450)
UIBC: 37 ug/dL

## 2016-05-05 LAB — RENAL FUNCTION PANEL
Albumin: 3.8 g/dL (ref 3.5–5.0)
Anion gap: 8 (ref 5–15)
BUN: 71 mg/dL — AB (ref 6–20)
CALCIUM: 8.9 mg/dL (ref 8.9–10.3)
CO2: 19 mmol/L — ABNORMAL LOW (ref 22–32)
CREATININE: 4.9 mg/dL — AB (ref 0.44–1.00)
Chloride: 115 mmol/L — ABNORMAL HIGH (ref 101–111)
GFR calc non Af Amer: 9 mL/min — ABNORMAL LOW (ref >60)
GFR, EST AFRICAN AMERICAN: 10 mL/min — AB (ref >60)
GLUCOSE: 109 mg/dL — AB (ref 65–99)
Phosphorus: 3.6 mg/dL (ref 2.5–4.6)
Potassium: 4.6 mmol/L (ref 3.5–5.1)
SODIUM: 142 mmol/L (ref 135–145)

## 2016-05-05 LAB — FERRITIN: FERRITIN: 1047 ng/mL — AB (ref 11–307)

## 2016-05-05 LAB — POCT HEMOGLOBIN-HEMACUE: Hemoglobin: 9 g/dL — ABNORMAL LOW (ref 12.0–15.0)

## 2016-05-05 MED ORDER — DARBEPOETIN ALFA 150 MCG/0.3ML IJ SOSY
PREFILLED_SYRINGE | INTRAMUSCULAR | Status: AC
  Administered 2016-05-05: 11:00:00 300 ug via SUBCUTANEOUS
  Filled 2016-05-05: qty 0.6

## 2016-05-05 MED ORDER — DARBEPOETIN ALFA 300 MCG/0.6ML IJ SOSY
300.0000 ug | PREFILLED_SYRINGE | INTRAMUSCULAR | Status: DC
  Administered 2016-05-05: 300 ug via SUBCUTANEOUS
  Filled 2016-05-05: qty 0.6

## 2016-05-06 LAB — PTH, INTACT AND CALCIUM
CALCIUM TOTAL (PTH): 8.7 mg/dL (ref 8.7–10.2)
PTH: 819 pg/mL — ABNORMAL HIGH (ref 15–65)

## 2016-05-26 ENCOUNTER — Encounter (HOSPITAL_COMMUNITY)
Admission: RE | Admit: 2016-05-26 | Discharge: 2016-05-26 | Disposition: A | Discharge status: Home / Self Care | Payer: Medicare HMO | Source: Ambulatory Visit | Attending: Nephrology | Admitting: Nephrology

## 2016-05-26 DIAGNOSIS — T451X5D Adverse effect of antineoplastic and immunosuppressive drugs, subsequent encounter: Secondary | ICD-10-CM

## 2016-05-26 DIAGNOSIS — D631 Anemia in chronic kidney disease: Secondary | ICD-10-CM | POA: Diagnosis not present

## 2016-05-26 DIAGNOSIS — N184 Chronic kidney disease, stage 4 (severe): Secondary | ICD-10-CM

## 2016-05-26 LAB — POCT HEMOGLOBIN-HEMACUE: Hemoglobin: 8.3 g/dL — ABNORMAL LOW (ref 12.0–15.0)

## 2016-05-26 MED ORDER — DARBEPOETIN ALFA 300 MCG/0.6ML IJ SOSY
300.0000 ug | PREFILLED_SYRINGE | INTRAMUSCULAR | Status: DC
  Administered 2016-05-26: 13:00:00 300 ug via SUBCUTANEOUS

## 2016-05-26 MED ORDER — DARBEPOETIN ALFA 150 MCG/0.3ML IJ SOSY
PREFILLED_SYRINGE | INTRAMUSCULAR | Status: DC
Start: 2016-05-26 — End: 2016-05-27
  Filled 2016-05-26: qty 0.6

## 2016-06-16 ENCOUNTER — Encounter (HOSPITAL_COMMUNITY)
Admission: RE | Admit: 2016-06-16 | Discharge: 2016-06-16 | Disposition: A | Discharge status: Home / Self Care | Payer: Medicare HMO | Source: Ambulatory Visit | Attending: Nephrology | Admitting: Nephrology

## 2016-06-16 DIAGNOSIS — N184 Chronic kidney disease, stage 4 (severe): Secondary | ICD-10-CM | POA: Insufficient documentation

## 2016-06-16 DIAGNOSIS — Z940 Kidney transplant status: Secondary | ICD-10-CM | POA: Diagnosis not present

## 2016-06-16 DIAGNOSIS — D631 Anemia in chronic kidney disease: Secondary | ICD-10-CM | POA: Diagnosis not present

## 2016-06-16 DIAGNOSIS — T451X5D Adverse effect of antineoplastic and immunosuppressive drugs, subsequent encounter: Secondary | ICD-10-CM

## 2016-06-16 LAB — IRON AND TIBC
Iron: 80 ug/dL (ref 28–170)
Saturation Ratios: 35 % — ABNORMAL HIGH (ref 10.4–31.8)
TIBC: 230 ug/dL — ABNORMAL LOW (ref 250–450)
UIBC: 150 ug/dL

## 2016-06-16 LAB — RENAL FUNCTION PANEL
ALBUMIN: 3.8 g/dL (ref 3.5–5.0)
ANION GAP: 9 (ref 5–15)
BUN: 55 mg/dL — ABNORMAL HIGH (ref 6–20)
CALCIUM: 8.6 mg/dL — AB (ref 8.9–10.3)
CO2: 17 mmol/L — ABNORMAL LOW (ref 22–32)
Chloride: 112 mmol/L — ABNORMAL HIGH (ref 101–111)
Creatinine, Ser: 5.41 mg/dL — ABNORMAL HIGH (ref 0.44–1.00)
GFR calc Af Amer: 9 mL/min — ABNORMAL LOW (ref >60)
GFR, EST NON AFRICAN AMERICAN: 8 mL/min — AB (ref >60)
Glucose, Bld: 96 mg/dL (ref 65–99)
PHOSPHORUS: 4.3 mg/dL (ref 2.5–4.6)
POTASSIUM: 5.4 mmol/L — AB (ref 3.5–5.1)
SODIUM: 138 mmol/L (ref 135–145)

## 2016-06-16 LAB — POCT HEMOGLOBIN-HEMACUE: HEMOGLOBIN: 8.1 g/dL — AB (ref 12.0–15.0)

## 2016-06-16 LAB — FERRITIN: Ferritin: 651 ng/mL — ABNORMAL HIGH (ref 11–307)

## 2016-06-16 MED ORDER — DARBEPOETIN ALFA 150 MCG/0.3ML IJ SOSY
PREFILLED_SYRINGE | INTRAMUSCULAR | Status: AC
  Filled 2016-06-16: qty 0.6

## 2016-06-16 MED ORDER — DARBEPOETIN ALFA 300 MCG/0.6ML IJ SOSY
300.0000 ug | PREFILLED_SYRINGE | INTRAMUSCULAR | Status: DC
  Administered 2016-06-16: 13:00:00 300 ug via SUBCUTANEOUS
  Filled 2016-06-16: qty 0.6

## 2016-06-17 LAB — PTH, INTACT AND CALCIUM
CALCIUM TOTAL (PTH): 8.5 mg/dL — AB (ref 8.7–10.2)
PTH: 893 pg/mL — ABNORMAL HIGH (ref 15–65)

## 2016-06-30 ENCOUNTER — Encounter (HOSPITAL_COMMUNITY)
Admission: RE | Admit: 2016-06-30 | Discharge: 2016-06-30 | Disposition: A | Discharge status: Home / Self Care | Payer: Medicare HMO | Source: Ambulatory Visit | Attending: Nephrology | Admitting: Nephrology

## 2016-06-30 DIAGNOSIS — Z940 Kidney transplant status: Secondary | ICD-10-CM | POA: Diagnosis not present

## 2016-06-30 DIAGNOSIS — D631 Anemia in chronic kidney disease: Secondary | ICD-10-CM | POA: Insufficient documentation

## 2016-06-30 DIAGNOSIS — N184 Chronic kidney disease, stage 4 (severe): Secondary | ICD-10-CM | POA: Diagnosis not present

## 2016-06-30 DIAGNOSIS — T451X5D Adverse effect of antineoplastic and immunosuppressive drugs, subsequent encounter: Secondary | ICD-10-CM

## 2016-06-30 LAB — POCT HEMOGLOBIN-HEMACUE: HEMOGLOBIN: 8.1 g/dL — AB (ref 12.0–15.0)

## 2016-06-30 MED ORDER — DARBEPOETIN ALFA 300 MCG/0.6ML IJ SOSY
300.0000 ug | PREFILLED_SYRINGE | INTRAMUSCULAR | Status: DC
  Administered 2016-06-30: 11:00:00 300 ug via SUBCUTANEOUS
  Filled 2016-06-30: qty 0.6

## 2016-06-30 MED ORDER — DARBEPOETIN ALFA 150 MCG/0.3ML IJ SOSY
PREFILLED_SYRINGE | INTRAMUSCULAR | Status: AC
  Filled 2016-06-30: qty 0.3

## 2016-07-07 ENCOUNTER — Encounter (HOSPITAL_COMMUNITY): Payer: Medicare Other

## 2016-07-14 ENCOUNTER — Encounter (HOSPITAL_COMMUNITY)
Admission: RE | Admit: 2016-07-14 | Discharge: 2016-07-14 | Disposition: A | Discharge status: Home / Self Care | Payer: Medicare HMO | Source: Ambulatory Visit | Attending: Nephrology | Admitting: Nephrology

## 2016-07-14 DIAGNOSIS — T451X5D Adverse effect of antineoplastic and immunosuppressive drugs, subsequent encounter: Secondary | ICD-10-CM

## 2016-07-14 DIAGNOSIS — N184 Chronic kidney disease, stage 4 (severe): Secondary | ICD-10-CM

## 2016-07-14 DIAGNOSIS — D631 Anemia in chronic kidney disease: Secondary | ICD-10-CM | POA: Diagnosis not present

## 2016-07-14 LAB — RENAL FUNCTION PANEL
ANION GAP: 9 (ref 5–15)
Albumin: 3.7 g/dL (ref 3.5–5.0)
BUN: 64 mg/dL — ABNORMAL HIGH (ref 6–20)
CALCIUM: 8.8 mg/dL — AB (ref 8.9–10.3)
CHLORIDE: 113 mmol/L — AB (ref 101–111)
CO2: 16 mmol/L — AB (ref 22–32)
Creatinine, Ser: 5.74 mg/dL — ABNORMAL HIGH (ref 0.44–1.00)
GFR calc Af Amer: 9 mL/min — ABNORMAL LOW (ref >60)
GFR calc non Af Amer: 7 mL/min — ABNORMAL LOW (ref >60)
GLUCOSE: 98 mg/dL (ref 65–99)
Phosphorus: 4.6 mg/dL (ref 2.5–4.6)
Potassium: 4.7 mmol/L (ref 3.5–5.1)
SODIUM: 138 mmol/L (ref 135–145)

## 2016-07-14 LAB — POCT HEMOGLOBIN-HEMACUE: Hemoglobin: 8.8 g/dL — ABNORMAL LOW (ref 12.0–15.0)

## 2016-07-14 LAB — FERRITIN: FERRITIN: 626 ng/mL — AB (ref 11–307)

## 2016-07-14 LAB — IRON AND TIBC
IRON: 76 ug/dL (ref 28–170)
Saturation Ratios: 36 % — ABNORMAL HIGH (ref 10.4–31.8)
TIBC: 210 ug/dL — ABNORMAL LOW (ref 250–450)
UIBC: 134 ug/dL

## 2016-07-14 MED ORDER — DARBEPOETIN ALFA 150 MCG/0.3ML IJ SOSY
PREFILLED_SYRINGE | INTRAMUSCULAR | Status: AC
  Administered 2016-07-14: 300 ug via SUBCUTANEOUS
  Filled 2016-07-14: qty 0.6

## 2016-07-14 MED ORDER — DARBEPOETIN ALFA 300 MCG/0.6ML IJ SOSY
300.0000 ug | PREFILLED_SYRINGE | INTRAMUSCULAR | Status: DC
  Administered 2016-07-14: 300 ug via SUBCUTANEOUS
  Filled 2016-07-14: qty 0.6

## 2016-07-28 ENCOUNTER — Encounter (HOSPITAL_COMMUNITY)
Admission: RE | Admit: 2016-07-28 | Discharge: 2016-07-28 | Disposition: A | Discharge status: Home / Self Care | Payer: Medicare HMO | Source: Ambulatory Visit | Attending: Nephrology | Admitting: Nephrology

## 2016-07-28 DIAGNOSIS — T451X5D Adverse effect of antineoplastic and immunosuppressive drugs, subsequent encounter: Secondary | ICD-10-CM

## 2016-07-28 DIAGNOSIS — N184 Chronic kidney disease, stage 4 (severe): Secondary | ICD-10-CM

## 2016-07-28 DIAGNOSIS — D631 Anemia in chronic kidney disease: Secondary | ICD-10-CM

## 2016-07-28 LAB — POCT HEMOGLOBIN-HEMACUE: HEMOGLOBIN: 9.2 g/dL — AB (ref 12.0–15.0)

## 2016-07-28 MED ORDER — DARBEPOETIN ALFA 300 MCG/0.6ML IJ SOSY
300.0000 ug | PREFILLED_SYRINGE | INTRAMUSCULAR | Status: DC
  Filled 2016-07-28: qty 0.6

## 2016-07-28 MED ORDER — DARBEPOETIN ALFA 200 MCG/0.4ML IJ SOSY
PREFILLED_SYRINGE | INTRAMUSCULAR | Status: AC
  Administered 2016-07-28: 200 ug via SUBCUTANEOUS
  Filled 2016-07-28: qty 0.4

## 2016-07-28 MED ORDER — DARBEPOETIN ALFA 100 MCG/0.5ML IJ SOSY
PREFILLED_SYRINGE | INTRAMUSCULAR | Status: AC
  Administered 2016-07-28: 100 ug via SUBCUTANEOUS
  Filled 2016-07-28: qty 0.5

## 2016-08-16 ENCOUNTER — Encounter (HOSPITAL_COMMUNITY)
Admission: RE | Admit: 2016-08-16 | Discharge: 2016-08-16 | Disposition: A | Discharge status: Home / Self Care | Payer: Medicare HMO | Source: Ambulatory Visit | Attending: Nephrology | Admitting: Nephrology

## 2016-08-16 DIAGNOSIS — N184 Chronic kidney disease, stage 4 (severe): Secondary | ICD-10-CM | POA: Insufficient documentation

## 2016-08-16 DIAGNOSIS — Z940 Kidney transplant status: Secondary | ICD-10-CM | POA: Diagnosis not present

## 2016-08-16 DIAGNOSIS — T451X5D Adverse effect of antineoplastic and immunosuppressive drugs, subsequent encounter: Secondary | ICD-10-CM

## 2016-08-16 DIAGNOSIS — D631 Anemia in chronic kidney disease: Secondary | ICD-10-CM | POA: Diagnosis present

## 2016-08-16 LAB — IRON AND TIBC
Iron: 56 ug/dL (ref 28–170)
SATURATION RATIOS: 27 % (ref 10.4–31.8)
TIBC: 210 ug/dL — ABNORMAL LOW (ref 250–450)
UIBC: 154 ug/dL

## 2016-08-16 LAB — RENAL FUNCTION PANEL
ALBUMIN: 3.7 g/dL (ref 3.5–5.0)
ANION GAP: 8 (ref 5–15)
BUN: 36 mg/dL — AB (ref 6–20)
CALCIUM: 8.7 mg/dL — AB (ref 8.9–10.3)
CO2: 17 mmol/L — ABNORMAL LOW (ref 22–32)
Chloride: 118 mmol/L — ABNORMAL HIGH (ref 101–111)
Creatinine, Ser: 5.05 mg/dL — ABNORMAL HIGH (ref 0.44–1.00)
GFR calc Af Amer: 10 mL/min — ABNORMAL LOW (ref >60)
GFR, EST NON AFRICAN AMERICAN: 9 mL/min — AB (ref >60)
GLUCOSE: 100 mg/dL — AB (ref 65–99)
PHOSPHORUS: 4.6 mg/dL (ref 2.5–4.6)
POTASSIUM: 4.4 mmol/L (ref 3.5–5.1)
SODIUM: 143 mmol/L (ref 135–145)

## 2016-08-16 LAB — POCT HEMOGLOBIN-HEMACUE: HEMOGLOBIN: 9.7 g/dL — AB (ref 12.0–15.0)

## 2016-08-16 LAB — FERRITIN: Ferritin: 542 ng/mL — ABNORMAL HIGH (ref 11–307)

## 2016-08-16 MED ORDER — DARBEPOETIN ALFA 300 MCG/0.6ML IJ SOSY
300.0000 ug | PREFILLED_SYRINGE | INTRAMUSCULAR | Status: DC
  Administered 2016-08-16: 14:00:00 300 ug via SUBCUTANEOUS
  Filled 2016-08-16: qty 0.6

## 2016-08-16 MED ORDER — DARBEPOETIN ALFA 150 MCG/0.3ML IJ SOSY
PREFILLED_SYRINGE | INTRAMUSCULAR | Status: AC
  Filled 2016-08-16: qty 0.6

## 2016-08-30 ENCOUNTER — Encounter (HOSPITAL_COMMUNITY)
Admission: RE | Admit: 2016-08-30 | Discharge: 2016-08-30 | Disposition: A | Discharge status: Home / Self Care | Payer: Medicare HMO | Source: Ambulatory Visit | Attending: Nephrology | Admitting: Nephrology

## 2016-08-30 DIAGNOSIS — Z940 Kidney transplant status: Secondary | ICD-10-CM | POA: Diagnosis not present

## 2016-08-30 DIAGNOSIS — T451X5D Adverse effect of antineoplastic and immunosuppressive drugs, subsequent encounter: Secondary | ICD-10-CM

## 2016-08-30 DIAGNOSIS — D631 Anemia in chronic kidney disease: Secondary | ICD-10-CM | POA: Insufficient documentation

## 2016-08-30 DIAGNOSIS — N184 Chronic kidney disease, stage 4 (severe): Secondary | ICD-10-CM | POA: Diagnosis not present

## 2016-08-30 LAB — POCT HEMOGLOBIN-HEMACUE: HEMOGLOBIN: 10.3 g/dL — AB (ref 12.0–15.0)

## 2016-08-30 MED ORDER — DARBEPOETIN ALFA 150 MCG/0.3ML IJ SOSY
PREFILLED_SYRINGE | INTRAMUSCULAR | Status: AC
  Filled 2016-08-30: qty 0.6

## 2016-08-30 MED ORDER — DARBEPOETIN ALFA 300 MCG/0.6ML IJ SOSY
300.0000 ug | PREFILLED_SYRINGE | INTRAMUSCULAR | Status: DC
  Administered 2016-08-30: 14:00:00 300 ug via SUBCUTANEOUS
  Filled 2016-08-30: qty 0.6

## 2016-09-13 ENCOUNTER — Encounter (HOSPITAL_COMMUNITY)
Admission: RE | Admit: 2016-09-13 | Discharge: 2016-09-13 | Disposition: A | Discharge status: Home / Self Care | Payer: Medicare HMO | Source: Ambulatory Visit | Attending: Nephrology | Admitting: Nephrology

## 2016-09-13 DIAGNOSIS — T451X5D Adverse effect of antineoplastic and immunosuppressive drugs, subsequent encounter: Secondary | ICD-10-CM

## 2016-09-13 DIAGNOSIS — D631 Anemia in chronic kidney disease: Secondary | ICD-10-CM

## 2016-09-13 DIAGNOSIS — N184 Chronic kidney disease, stage 4 (severe): Secondary | ICD-10-CM

## 2016-09-13 LAB — RENAL FUNCTION PANEL
Albumin: 3.7 g/dL (ref 3.5–5.0)
Anion gap: 7 (ref 5–15)
BUN: 58 mg/dL — AB (ref 6–20)
CHLORIDE: 114 mmol/L — AB (ref 101–111)
CO2: 18 mmol/L — AB (ref 22–32)
Calcium: 8.5 mg/dL — ABNORMAL LOW (ref 8.9–10.3)
Creatinine, Ser: 5.18 mg/dL — ABNORMAL HIGH (ref 0.44–1.00)
GFR calc Af Amer: 10 mL/min — ABNORMAL LOW (ref >60)
GFR, EST NON AFRICAN AMERICAN: 8 mL/min — AB (ref >60)
Glucose, Bld: 100 mg/dL — ABNORMAL HIGH (ref 65–99)
POTASSIUM: 4.4 mmol/L (ref 3.5–5.1)
Phosphorus: 4 mg/dL (ref 2.5–4.6)
Sodium: 139 mmol/L (ref 135–145)

## 2016-09-13 LAB — IRON AND TIBC
IRON: 61 ug/dL (ref 28–170)
SATURATION RATIOS: 29 % (ref 10.4–31.8)
TIBC: 213 ug/dL — AB (ref 250–450)
UIBC: 152 ug/dL

## 2016-09-13 LAB — FERRITIN: Ferritin: 432 ng/mL — ABNORMAL HIGH (ref 11–307)

## 2016-09-13 LAB — POCT HEMOGLOBIN-HEMACUE: HEMOGLOBIN: 10.6 g/dL — AB (ref 12.0–15.0)

## 2016-09-13 MED ORDER — DARBEPOETIN ALFA 300 MCG/0.6ML IJ SOSY
300.0000 ug | PREFILLED_SYRINGE | INTRAMUSCULAR | Status: DC
  Administered 2016-09-13: 14:00:00 300 ug via SUBCUTANEOUS
  Filled 2016-09-13: qty 0.6

## 2016-09-13 MED ORDER — DARBEPOETIN ALFA 150 MCG/0.3ML IJ SOSY
PREFILLED_SYRINGE | INTRAMUSCULAR | Status: AC
  Filled 2016-09-13: qty 0.6

## 2016-09-14 LAB — PTH, INTACT AND CALCIUM
CALCIUM TOTAL (PTH): 8.5 mg/dL — AB (ref 8.7–10.2)
PTH: 382 pg/mL — AB (ref 15–65)

## 2016-09-14 MED FILL — Darbepoetin Alfa Soln Prefilled Syringe 150 MCG/0.3ML: INTRAMUSCULAR | Qty: 0.6 | Status: AC

## 2016-09-27 ENCOUNTER — Encounter (HOSPITAL_COMMUNITY)
Admission: RE | Admit: 2016-09-27 | Discharge: 2016-09-27 | Disposition: A | Discharge status: Home / Self Care | Payer: Medicare HMO | Source: Ambulatory Visit | Attending: Nephrology | Admitting: Nephrology

## 2016-09-27 DIAGNOSIS — D631 Anemia in chronic kidney disease: Secondary | ICD-10-CM | POA: Diagnosis not present

## 2016-09-27 DIAGNOSIS — N184 Chronic kidney disease, stage 4 (severe): Secondary | ICD-10-CM

## 2016-09-27 DIAGNOSIS — T451X5D Adverse effect of antineoplastic and immunosuppressive drugs, subsequent encounter: Secondary | ICD-10-CM

## 2016-09-27 LAB — POCT HEMOGLOBIN-HEMACUE: HEMOGLOBIN: 10.6 g/dL — AB (ref 12.0–15.0)

## 2016-09-27 MED ORDER — DARBEPOETIN ALFA 300 MCG/0.6ML IJ SOSY
300.0000 ug | PREFILLED_SYRINGE | INTRAMUSCULAR | Status: DC
  Administered 2016-09-27: 300 ug via SUBCUTANEOUS
  Filled 2016-09-27: qty 0.6

## 2016-09-27 MED ORDER — DARBEPOETIN ALFA 150 MCG/0.3ML IJ SOSY
PREFILLED_SYRINGE | INTRAMUSCULAR | Status: AC
  Administered 2016-09-27: 300 ug via SUBCUTANEOUS
  Filled 2016-09-27: qty 0.6

## 2016-10-11 ENCOUNTER — Encounter (HOSPITAL_COMMUNITY)
Admission: RE | Admit: 2016-10-11 | Discharge: 2016-10-11 | Disposition: A | Discharge status: Home / Self Care | Payer: Medicare HMO | Source: Ambulatory Visit | Attending: Nephrology | Admitting: Nephrology

## 2016-10-11 DIAGNOSIS — N184 Chronic kidney disease, stage 4 (severe): Secondary | ICD-10-CM | POA: Insufficient documentation

## 2016-10-11 DIAGNOSIS — Z940 Kidney transplant status: Secondary | ICD-10-CM | POA: Insufficient documentation

## 2016-10-11 DIAGNOSIS — T451X5D Adverse effect of antineoplastic and immunosuppressive drugs, subsequent encounter: Secondary | ICD-10-CM

## 2016-10-11 DIAGNOSIS — D631 Anemia in chronic kidney disease: Secondary | ICD-10-CM | POA: Insufficient documentation

## 2016-10-11 LAB — RENAL FUNCTION PANEL
ALBUMIN: 3.7 g/dL (ref 3.5–5.0)
Anion gap: 10 (ref 5–15)
BUN: 46 mg/dL — AB (ref 6–20)
CHLORIDE: 113 mmol/L — AB (ref 101–111)
CO2: 18 mmol/L — ABNORMAL LOW (ref 22–32)
CREATININE: 5.11 mg/dL — AB (ref 0.44–1.00)
Calcium: 9 mg/dL (ref 8.9–10.3)
GFR calc Af Amer: 10 mL/min — ABNORMAL LOW (ref >60)
GFR, EST NON AFRICAN AMERICAN: 9 mL/min — AB (ref >60)
GLUCOSE: 102 mg/dL — AB (ref 65–99)
PHOSPHORUS: 4.3 mg/dL (ref 2.5–4.6)
POTASSIUM: 4.5 mmol/L (ref 3.5–5.1)
Sodium: 141 mmol/L (ref 135–145)

## 2016-10-11 LAB — IRON AND TIBC
Iron: 55 ug/dL (ref 28–170)
SATURATION RATIOS: 26 % (ref 10.4–31.8)
TIBC: 213 ug/dL — ABNORMAL LOW (ref 250–450)
UIBC: 158 ug/dL

## 2016-10-11 LAB — FERRITIN: FERRITIN: 453 ng/mL — AB (ref 11–307)

## 2016-10-11 LAB — POCT HEMOGLOBIN-HEMACUE: HEMOGLOBIN: 11 g/dL — AB (ref 12.0–15.0)

## 2016-10-11 MED ORDER — DARBEPOETIN ALFA 150 MCG/0.3ML IJ SOSY
PREFILLED_SYRINGE | INTRAMUSCULAR | Status: AC
  Filled 2016-10-11: qty 0.6

## 2016-10-11 MED ORDER — DARBEPOETIN ALFA 300 MCG/0.6ML IJ SOSY
300.0000 ug | PREFILLED_SYRINGE | INTRAMUSCULAR | Status: DC
  Administered 2016-10-11: 300 ug via SUBCUTANEOUS
  Filled 2016-10-11: qty 0.6

## 2016-10-25 ENCOUNTER — Encounter (HOSPITAL_COMMUNITY)
Admission: RE | Admit: 2016-10-25 | Discharge: 2016-10-25 | Disposition: A | Discharge status: Home / Self Care | Payer: Medicare HMO | Source: Ambulatory Visit | Attending: Nephrology | Admitting: Nephrology

## 2016-10-25 DIAGNOSIS — T451X5D Adverse effect of antineoplastic and immunosuppressive drugs, subsequent encounter: Secondary | ICD-10-CM

## 2016-10-25 DIAGNOSIS — D631 Anemia in chronic kidney disease: Secondary | ICD-10-CM | POA: Diagnosis not present

## 2016-10-25 DIAGNOSIS — N184 Chronic kidney disease, stage 4 (severe): Secondary | ICD-10-CM

## 2016-10-25 LAB — POCT HEMOGLOBIN-HEMACUE: Hemoglobin: 12.2 g/dL (ref 12.0–15.0)

## 2016-10-25 MED ORDER — DARBEPOETIN ALFA 300 MCG/0.6ML IJ SOSY: 300.0000 ug | PREFILLED_SYRINGE | INTRAMUSCULAR | Status: DC

## 2016-11-08 ENCOUNTER — Encounter (HOSPITAL_COMMUNITY)
Admission: RE | Admit: 2016-11-08 | Discharge: 2016-11-08 | Disposition: A | Discharge status: Home / Self Care | Payer: Medicare HMO | Source: Ambulatory Visit | Attending: Nephrology | Admitting: Nephrology

## 2016-11-08 DIAGNOSIS — Z940 Kidney transplant status: Secondary | ICD-10-CM | POA: Insufficient documentation

## 2016-11-08 DIAGNOSIS — N184 Chronic kidney disease, stage 4 (severe): Secondary | ICD-10-CM | POA: Insufficient documentation

## 2016-11-08 DIAGNOSIS — D631 Anemia in chronic kidney disease: Secondary | ICD-10-CM | POA: Diagnosis not present

## 2016-11-08 DIAGNOSIS — T451X5D Adverse effect of antineoplastic and immunosuppressive drugs, subsequent encounter: Secondary | ICD-10-CM

## 2016-11-08 LAB — IRON AND TIBC
IRON: 124 ug/dL (ref 28–170)
Saturation Ratios: 61 % — ABNORMAL HIGH (ref 10.4–31.8)
TIBC: 204 ug/dL — AB (ref 250–450)
UIBC: 80 ug/dL

## 2016-11-08 LAB — POCT HEMOGLOBIN-HEMACUE: HEMOGLOBIN: 10.7 g/dL — AB (ref 12.0–15.0)

## 2016-11-08 LAB — RENAL FUNCTION PANEL
ALBUMIN: 3.7 g/dL (ref 3.5–5.0)
ANION GAP: 8 (ref 5–15)
BUN: 61 mg/dL — AB (ref 6–20)
CHLORIDE: 114 mmol/L — AB (ref 101–111)
CO2: 17 mmol/L — ABNORMAL LOW (ref 22–32)
Calcium: 8.7 mg/dL — ABNORMAL LOW (ref 8.9–10.3)
Creatinine, Ser: 5.2 mg/dL — ABNORMAL HIGH (ref 0.44–1.00)
GFR, EST AFRICAN AMERICAN: 10 mL/min — AB (ref >60)
GFR, EST NON AFRICAN AMERICAN: 8 mL/min — AB (ref >60)
Glucose, Bld: 99 mg/dL (ref 65–99)
PHOSPHORUS: 5.2 mg/dL — AB (ref 2.5–4.6)
POTASSIUM: 4.8 mmol/L (ref 3.5–5.1)
Sodium: 139 mmol/L (ref 135–145)

## 2016-11-08 LAB — FERRITIN: Ferritin: 430 ng/mL — ABNORMAL HIGH (ref 11–307)

## 2016-11-08 MED ORDER — DARBEPOETIN ALFA 150 MCG/0.3ML IJ SOSY
PREFILLED_SYRINGE | INTRAMUSCULAR | Status: AC
  Filled 2016-11-08: qty 0.6

## 2016-11-08 MED ORDER — DARBEPOETIN ALFA 300 MCG/0.6ML IJ SOSY
300.0000 ug | PREFILLED_SYRINGE | INTRAMUSCULAR | Status: DC
  Administered 2016-11-08: 15:00:00 300 ug via SUBCUTANEOUS

## 2016-11-09 MED FILL — Darbepoetin Alfa Soln Prefilled Syringe 150 MCG/0.3ML: INTRAMUSCULAR | Qty: 0.3 | Status: AC

## 2016-11-15 ENCOUNTER — Encounter (HOSPITAL_COMMUNITY): Payer: Medicare Other

## 2016-11-22 ENCOUNTER — Encounter (HOSPITAL_COMMUNITY)
Admission: RE | Admit: 2016-11-22 | Discharge: 2016-11-22 | Disposition: A | Discharge status: Home / Self Care | Payer: Medicare HMO | Source: Ambulatory Visit | Attending: Nephrology | Admitting: Nephrology

## 2016-11-22 DIAGNOSIS — N184 Chronic kidney disease, stage 4 (severe): Secondary | ICD-10-CM

## 2016-11-22 DIAGNOSIS — D631 Anemia in chronic kidney disease: Secondary | ICD-10-CM | POA: Diagnosis not present

## 2016-11-22 DIAGNOSIS — T451X5D Adverse effect of antineoplastic and immunosuppressive drugs, subsequent encounter: Secondary | ICD-10-CM

## 2016-11-22 LAB — POCT HEMOGLOBIN-HEMACUE: Hemoglobin: 10.6 g/dL — ABNORMAL LOW (ref 12.0–15.0)

## 2016-11-22 MED ORDER — DARBEPOETIN ALFA 150 MCG/0.3ML IJ SOSY
PREFILLED_SYRINGE | INTRAMUSCULAR | Status: AC
  Administered 2016-11-22: 14:00:00 300 ug via SUBCUTANEOUS
  Filled 2016-11-22: qty 0.6

## 2016-11-22 MED ORDER — DARBEPOETIN ALFA 300 MCG/0.6ML IJ SOSY
300.0000 ug | PREFILLED_SYRINGE | INTRAMUSCULAR | Status: DC
  Administered 2016-11-22: 300 ug via SUBCUTANEOUS
  Filled 2016-11-22: qty 0.6

## 2016-12-06 ENCOUNTER — Encounter (HOSPITAL_COMMUNITY)
Admission: RE | Admit: 2016-12-06 | Discharge: 2016-12-06 | Disposition: A | Discharge status: Home / Self Care | Payer: Medicare HMO | Source: Ambulatory Visit | Attending: Nephrology | Admitting: Nephrology

## 2016-12-06 DIAGNOSIS — Z940 Kidney transplant status: Secondary | ICD-10-CM | POA: Insufficient documentation

## 2016-12-06 DIAGNOSIS — N184 Chronic kidney disease, stage 4 (severe): Secondary | ICD-10-CM | POA: Diagnosis not present

## 2016-12-06 DIAGNOSIS — D631 Anemia in chronic kidney disease: Secondary | ICD-10-CM | POA: Diagnosis present

## 2016-12-06 DIAGNOSIS — T451X5D Adverse effect of antineoplastic and immunosuppressive drugs, subsequent encounter: Secondary | ICD-10-CM

## 2016-12-06 LAB — RENAL FUNCTION PANEL
ALBUMIN: 3.8 g/dL (ref 3.5–5.0)
Anion gap: 13 (ref 5–15)
BUN: 51 mg/dL — AB (ref 6–20)
CALCIUM: 8.8 mg/dL — AB (ref 8.9–10.3)
CO2: 18 mmol/L — AB (ref 22–32)
CREATININE: 5.32 mg/dL — AB (ref 0.44–1.00)
Chloride: 110 mmol/L (ref 101–111)
GFR calc Af Amer: 9 mL/min — ABNORMAL LOW (ref >60)
GFR calc non Af Amer: 8 mL/min — ABNORMAL LOW (ref >60)
GLUCOSE: 101 mg/dL — AB (ref 65–99)
POTASSIUM: 4.7 mmol/L (ref 3.5–5.1)
Phosphorus: 5.3 mg/dL — ABNORMAL HIGH (ref 2.5–4.6)
Sodium: 141 mmol/L (ref 135–145)

## 2016-12-06 LAB — POCT HEMOGLOBIN-HEMACUE: Hemoglobin: 10.4 g/dL — ABNORMAL LOW (ref 12.0–15.0)

## 2016-12-06 LAB — IRON AND TIBC
Iron: 52 ug/dL (ref 28–170)
SATURATION RATIOS: 25 % (ref 10.4–31.8)
TIBC: 204 ug/dL — AB (ref 250–450)
UIBC: 152 ug/dL

## 2016-12-06 LAB — FERRITIN: Ferritin: 413 ng/mL — ABNORMAL HIGH (ref 11–307)

## 2016-12-06 MED ORDER — DARBEPOETIN ALFA 150 MCG/0.3ML IJ SOSY
PREFILLED_SYRINGE | INTRAMUSCULAR | Status: AC
  Administered 2016-12-06: 14:00:00 300 ug via SUBCUTANEOUS
  Filled 2016-12-06: qty 0.6

## 2016-12-06 MED ORDER — DARBEPOETIN ALFA 300 MCG/0.6ML IJ SOSY
300.0000 ug | PREFILLED_SYRINGE | INTRAMUSCULAR | Status: DC
  Administered 2016-12-06: 300 ug via SUBCUTANEOUS

## 2016-12-07 LAB — PTH, INTACT AND CALCIUM
CALCIUM TOTAL (PTH): 8.9 mg/dL (ref 8.7–10.2)
PTH: 1136 pg/mL — AB (ref 15–65)

## 2016-12-20 ENCOUNTER — Encounter (HOSPITAL_COMMUNITY)
Admission: RE | Admit: 2016-12-20 | Discharge: 2016-12-20 | Disposition: A | Discharge status: Home / Self Care | Payer: Medicare HMO | Source: Ambulatory Visit | Attending: Nephrology | Admitting: Nephrology

## 2016-12-20 DIAGNOSIS — T451X5D Adverse effect of antineoplastic and immunosuppressive drugs, subsequent encounter: Secondary | ICD-10-CM

## 2016-12-20 DIAGNOSIS — N184 Chronic kidney disease, stage 4 (severe): Secondary | ICD-10-CM

## 2016-12-20 DIAGNOSIS — D631 Anemia in chronic kidney disease: Secondary | ICD-10-CM

## 2016-12-20 LAB — POCT HEMOGLOBIN-HEMACUE: Hemoglobin: 10.9 g/dL — ABNORMAL LOW (ref 12.0–15.0)

## 2016-12-20 MED ORDER — DARBEPOETIN ALFA 100 MCG/0.5ML IJ SOSY
PREFILLED_SYRINGE | INTRAMUSCULAR | Status: AC
  Administered 2016-12-20: 14:00:00 100 ug
  Filled 2016-12-20: qty 0.5

## 2016-12-20 MED ORDER — DARBEPOETIN ALFA 300 MCG/0.6ML IJ SOSY
300.0000 ug | PREFILLED_SYRINGE | INTRAMUSCULAR | Status: DC
  Administered 2016-12-20: 200 ug via SUBCUTANEOUS
  Filled 2016-12-20: qty 0.6

## 2016-12-20 MED ORDER — DARBEPOETIN ALFA 200 MCG/0.4ML IJ SOSY
PREFILLED_SYRINGE | INTRAMUSCULAR | Status: AC
  Administered 2016-12-20: 14:00:00 200 ug via SUBCUTANEOUS
  Filled 2016-12-20: qty 0.4

## 2017-01-03 ENCOUNTER — Inpatient Hospital Stay (HOSPITAL_COMMUNITY): Admission: RE | Admit: 2017-01-03 | Payer: Medicare HMO | Source: Ambulatory Visit

## 2017-01-17 ENCOUNTER — Inpatient Hospital Stay (HOSPITAL_COMMUNITY)
Admission: RE | Admit: 2017-01-17 | Discharge: 2017-01-17 | Disposition: A | Discharge status: Home / Self Care | Payer: Medicare HMO | Source: Ambulatory Visit | Attending: Nephrology | Admitting: Nephrology

## 2017-02-08 ENCOUNTER — Inpatient Hospital Stay (HOSPITAL_COMMUNITY)
Admission: RE | Admit: 2017-02-08 | Discharge: 2017-02-08 | Disposition: A | Discharge status: Home / Self Care | Payer: Medicare HMO | Source: Ambulatory Visit

## 2017-02-15 ENCOUNTER — Encounter (HOSPITAL_COMMUNITY)
Admission: RE | Admit: 2017-02-15 | Discharge: 2017-02-15 | Disposition: A | Discharge status: Home / Self Care | Payer: Medicare HMO | Source: Ambulatory Visit | Attending: Nephrology | Admitting: Nephrology

## 2017-02-15 VITALS — BP 115/73 | HR 76 | Temp 98.6°F | Resp 18

## 2017-02-15 DIAGNOSIS — D631 Anemia in chronic kidney disease: Secondary | ICD-10-CM | POA: Insufficient documentation

## 2017-02-15 DIAGNOSIS — Z940 Kidney transplant status: Secondary | ICD-10-CM | POA: Insufficient documentation

## 2017-02-15 DIAGNOSIS — N184 Chronic kidney disease, stage 4 (severe): Secondary | ICD-10-CM | POA: Insufficient documentation

## 2017-02-15 DIAGNOSIS — T451X5D Adverse effect of antineoplastic and immunosuppressive drugs, subsequent encounter: Secondary | ICD-10-CM

## 2017-02-15 LAB — FERRITIN: Ferritin: 511 ng/mL — ABNORMAL HIGH (ref 11–307)

## 2017-02-15 LAB — POCT HEMOGLOBIN-HEMACUE: Hemoglobin: 9 g/dL — ABNORMAL LOW (ref 12.0–15.0)

## 2017-02-15 LAB — IRON AND TIBC
Iron: 156 ug/dL (ref 28–170)
Saturation Ratios: 80 % — ABNORMAL HIGH (ref 10.4–31.8)
TIBC: 195 ug/dL — ABNORMAL LOW (ref 250–450)
UIBC: 39 ug/dL

## 2017-02-15 MED ORDER — DARBEPOETIN ALFA 300 MCG/0.6ML IJ SOSY
300.0000 ug | PREFILLED_SYRINGE | INTRAMUSCULAR | Status: DC
  Administered 2017-02-15: 200 ug via SUBCUTANEOUS
  Filled 2017-02-15: qty 0.6

## 2017-02-15 MED ORDER — DARBEPOETIN ALFA 100 MCG/0.5ML IJ SOSY
PREFILLED_SYRINGE | INTRAMUSCULAR | Status: AC
  Administered 2017-02-15: 14:00:00 100 ug
  Filled 2017-02-15: qty 0.5

## 2017-02-15 MED ORDER — DARBEPOETIN ALFA 200 MCG/0.4ML IJ SOSY
PREFILLED_SYRINGE | INTRAMUSCULAR | Status: AC
  Administered 2017-02-15: 200 ug via SUBCUTANEOUS
  Filled 2017-02-15: qty 0.4

## 2017-03-02 ENCOUNTER — Inpatient Hospital Stay (HOSPITAL_COMMUNITY): Admission: RE | Admit: 2017-03-02 | Payer: Medicare HMO | Source: Ambulatory Visit

## 2017-03-20 ENCOUNTER — Encounter (HOSPITAL_COMMUNITY)
Admission: RE | Admit: 2017-03-20 | Discharge: 2017-03-20 | Disposition: A | Discharge status: Home / Self Care | Payer: Medicare HMO | Source: Ambulatory Visit | Attending: Nephrology | Admitting: Nephrology

## 2017-03-20 VITALS — BP 124/72 | HR 79 | Temp 98.0°F | Resp 18

## 2017-03-20 DIAGNOSIS — T451X5D Adverse effect of antineoplastic and immunosuppressive drugs, subsequent encounter: Secondary | ICD-10-CM

## 2017-03-20 DIAGNOSIS — Z940 Kidney transplant status: Secondary | ICD-10-CM | POA: Insufficient documentation

## 2017-03-20 DIAGNOSIS — D631 Anemia in chronic kidney disease: Secondary | ICD-10-CM | POA: Diagnosis present

## 2017-03-20 DIAGNOSIS — N184 Chronic kidney disease, stage 4 (severe): Secondary | ICD-10-CM | POA: Diagnosis not present

## 2017-03-20 LAB — RENAL FUNCTION PANEL
ANION GAP: 12 (ref 5–15)
Albumin: 3.6 g/dL (ref 3.5–5.0)
BUN: 51 mg/dL — ABNORMAL HIGH (ref 6–20)
CHLORIDE: 111 mmol/L (ref 101–111)
CO2: 16 mmol/L — AB (ref 22–32)
CREATININE: 5.65 mg/dL — AB (ref 0.44–1.00)
Calcium: 8.4 mg/dL — ABNORMAL LOW (ref 8.9–10.3)
GFR calc Af Amer: 9 mL/min — ABNORMAL LOW (ref >60)
GFR calc non Af Amer: 8 mL/min — ABNORMAL LOW (ref >60)
Glucose, Bld: 95 mg/dL (ref 65–99)
Phosphorus: 4.5 mg/dL (ref 2.5–4.6)
Potassium: 4.2 mmol/L (ref 3.5–5.1)
Sodium: 139 mmol/L (ref 135–145)

## 2017-03-20 LAB — FERRITIN: FERRITIN: 585 ng/mL — AB (ref 11–307)

## 2017-03-20 LAB — IRON AND TIBC
IRON: 139 ug/dL (ref 28–170)
Saturation Ratios: 76 % — ABNORMAL HIGH (ref 10.4–31.8)
TIBC: 183 ug/dL — AB (ref 250–450)
UIBC: 44 ug/dL

## 2017-03-20 LAB — POCT HEMOGLOBIN-HEMACUE: Hemoglobin: 7.7 g/dL — ABNORMAL LOW (ref 12.0–15.0)

## 2017-03-20 MED ORDER — DARBEPOETIN ALFA 150 MCG/0.3ML IJ SOSY
PREFILLED_SYRINGE | INTRAMUSCULAR | Status: AC
  Filled 2017-03-20: qty 0.6

## 2017-03-20 MED ORDER — DARBEPOETIN ALFA 300 MCG/0.6ML IJ SOSY
300.0000 ug | PREFILLED_SYRINGE | INTRAMUSCULAR | Status: DC
  Administered 2017-03-20: 300 ug via SUBCUTANEOUS
  Filled 2017-03-20: qty 0.6

## 2017-03-20 NOTE — Progress Notes (Signed)
Hemoglobin 7.7.  Pt states she has been tired and a little short of breath. No bleeding noted anywherel Stacy at Dr Serita Grit office called and she spoke with Dr Posey Pronto. We were instructed to give shot as ordered and inform pt that she will be getting a call from Dr Serita Grit assistant today.

## 2017-03-21 LAB — PTH, INTACT AND CALCIUM
Calcium, Total (PTH): 8.6 mg/dL — ABNORMAL LOW (ref 8.7–10.2)
PTH: 123 pg/mL — ABNORMAL HIGH (ref 15–65)

## 2017-03-23 ENCOUNTER — Encounter (HOSPITAL_COMMUNITY)
Admission: RE | Admit: 2017-03-23 | Discharge: 2017-03-23 | Disposition: A | Discharge status: Home / Self Care | Payer: Medicare HMO | Source: Ambulatory Visit | Attending: Nephrology | Admitting: Nephrology

## 2017-03-23 ENCOUNTER — Other Ambulatory Visit (HOSPITAL_COMMUNITY): Payer: Self-pay | Admitting: *Deleted

## 2017-03-23 DIAGNOSIS — D631 Anemia in chronic kidney disease: Secondary | ICD-10-CM | POA: Diagnosis not present

## 2017-03-23 LAB — PREPARE RBC (CROSSMATCH)

## 2017-03-24 ENCOUNTER — Encounter (HOSPITAL_COMMUNITY)
Admission: RE | Admit: 2017-03-24 | Discharge: 2017-03-24 | Disposition: A | Discharge status: Home / Self Care | Payer: Medicare HMO | Source: Ambulatory Visit | Attending: Nephrology | Admitting: Nephrology

## 2017-03-24 DIAGNOSIS — D631 Anemia in chronic kidney disease: Secondary | ICD-10-CM | POA: Diagnosis not present

## 2017-03-24 MED ORDER — SODIUM CHLORIDE 0.9 % IV SOLN: Freq: Once | INTRAVENOUS | Status: DC

## 2017-03-25 LAB — TYPE AND SCREEN
ABO/RH(D): A NEG
Antibody Screen: NEGATIVE
UNIT DIVISION: 0

## 2017-03-25 LAB — BPAM RBC
Blood Product Expiration Date: 201902112359
ISSUE DATE / TIME: 201901250914
Unit Type and Rh: 600

## 2017-04-03 ENCOUNTER — Encounter (HOSPITAL_COMMUNITY)
Admission: RE | Admit: 2017-04-03 | Discharge: 2017-04-03 | Disposition: A | Discharge status: Home / Self Care | Payer: Medicare HMO | Source: Ambulatory Visit | Attending: Nephrology | Admitting: Nephrology

## 2017-04-03 VITALS — BP 120/78 | HR 80 | Temp 98.7°F | Resp 18

## 2017-04-03 DIAGNOSIS — T451X5D Adverse effect of antineoplastic and immunosuppressive drugs, subsequent encounter: Secondary | ICD-10-CM

## 2017-04-03 DIAGNOSIS — N184 Chronic kidney disease, stage 4 (severe): Secondary | ICD-10-CM | POA: Insufficient documentation

## 2017-04-03 DIAGNOSIS — D631 Anemia in chronic kidney disease: Secondary | ICD-10-CM | POA: Diagnosis present

## 2017-04-03 DIAGNOSIS — Z940 Kidney transplant status: Secondary | ICD-10-CM | POA: Diagnosis not present

## 2017-04-03 DIAGNOSIS — T451X5A Adverse effect of antineoplastic and immunosuppressive drugs, initial encounter: Secondary | ICD-10-CM

## 2017-04-03 LAB — POCT HEMOGLOBIN-HEMACUE: Hemoglobin: 8.9 g/dL — ABNORMAL LOW (ref 12.0–15.0)

## 2017-04-03 MED ORDER — DARBEPOETIN ALFA 300 MCG/0.6ML IJ SOSY
300.0000 ug | PREFILLED_SYRINGE | INTRAMUSCULAR | Status: DC
  Administered 2017-04-03: 300 ug via SUBCUTANEOUS
  Filled 2017-04-03: qty 0.6

## 2017-04-03 MED ORDER — DARBEPOETIN ALFA 150 MCG/0.3ML IJ SOSY
PREFILLED_SYRINGE | INTRAMUSCULAR | Status: AC
  Administered 2017-04-03: 300 ug via SUBCUTANEOUS
  Filled 2017-04-03: qty 0.6

## 2017-04-04 LAB — TACROLIMUS LEVEL: Tacrolimus (FK506) - LabCorp: 8 ng/mL (ref 2.0–20.0)

## 2017-04-06 LAB — EPSTEIN BARR VRS(EBV DNA BY PCR)
EBV DNA QN BY PCR: NEGATIVE {copies}/mL
log10 EBV DNA Qn PCR: UNDETERMINED log10 copy/mL

## 2017-04-06 LAB — CMV DNA, QUANTITATIVE, PCR
CMV DNA Quant: POSITIVE IU/mL
LOG10 CMV QN DNA PL: UNDETERMINED {Log_IU}/mL

## 2017-04-08 LAB — BK QUANT PCR (PLASMA/SERUM)
BK Quantitaion PCR: NEGATIVE copies/mL
LOG10 BK QN PCR: UNDETERMINED {Log_copies}/mL

## 2017-04-17 ENCOUNTER — Encounter (HOSPITAL_COMMUNITY)
Admission: RE | Admit: 2017-04-17 | Discharge: 2017-04-17 | Disposition: A | Discharge status: Home / Self Care | Payer: Medicare HMO | Source: Ambulatory Visit | Attending: Nephrology | Admitting: Nephrology

## 2017-04-17 VITALS — BP 105/69 | HR 81 | Temp 98.9°F | Resp 18

## 2017-04-17 DIAGNOSIS — D631 Anemia in chronic kidney disease: Secondary | ICD-10-CM | POA: Diagnosis not present

## 2017-04-17 DIAGNOSIS — T451X5D Adverse effect of antineoplastic and immunosuppressive drugs, subsequent encounter: Secondary | ICD-10-CM

## 2017-04-17 DIAGNOSIS — N184 Chronic kidney disease, stage 4 (severe): Secondary | ICD-10-CM

## 2017-04-17 LAB — IRON AND TIBC
Iron: 123 ug/dL (ref 28–170)
Saturation Ratios: 59 % — ABNORMAL HIGH (ref 10.4–31.8)
TIBC: 209 ug/dL — ABNORMAL LOW (ref 250–450)
UIBC: 86 ug/dL

## 2017-04-17 LAB — RENAL FUNCTION PANEL
Albumin: 3.8 g/dL (ref 3.5–5.0)
Anion gap: 9 (ref 5–15)
BUN: 47 mg/dL — AB (ref 6–20)
CALCIUM: 8.8 mg/dL — AB (ref 8.9–10.3)
CO2: 16 mmol/L — AB (ref 22–32)
CREATININE: 5.47 mg/dL — AB (ref 0.44–1.00)
Chloride: 117 mmol/L — ABNORMAL HIGH (ref 101–111)
GFR calc Af Amer: 9 mL/min — ABNORMAL LOW (ref >60)
GFR, EST NON AFRICAN AMERICAN: 8 mL/min — AB (ref >60)
Glucose, Bld: 97 mg/dL (ref 65–99)
Phosphorus: 4.4 mg/dL (ref 2.5–4.6)
Potassium: 4.6 mmol/L (ref 3.5–5.1)
SODIUM: 142 mmol/L (ref 135–145)

## 2017-04-17 LAB — FERRITIN: FERRITIN: 671 ng/mL — AB (ref 11–307)

## 2017-04-17 LAB — POCT HEMOGLOBIN-HEMACUE: HEMOGLOBIN: 9.4 g/dL — AB (ref 12.0–15.0)

## 2017-04-17 MED ORDER — DARBEPOETIN ALFA 300 MCG/0.6ML IJ SOSY
300.0000 ug | PREFILLED_SYRINGE | INTRAMUSCULAR | Status: DC
  Filled 2017-04-17: qty 0.6

## 2017-04-17 MED ORDER — DARBEPOETIN ALFA 100 MCG/0.5ML IJ SOSY
PREFILLED_SYRINGE | INTRAMUSCULAR | Status: AC
  Administered 2017-04-17: 14:00:00 100 ug via SUBCUTANEOUS
  Filled 2017-04-17: qty 0.5

## 2017-04-17 MED ORDER — DARBEPOETIN ALFA 200 MCG/0.4ML IJ SOSY
PREFILLED_SYRINGE | INTRAMUSCULAR | Status: AC
  Administered 2017-04-17: 14:00:00 200 ug via SUBCUTANEOUS
  Filled 2017-04-17: qty 0.4

## 2017-04-18 LAB — PTH, INTACT AND CALCIUM
Calcium, Total (PTH): 8.7 mg/dL (ref 8.7–10.2)
PTH: 548 pg/mL — AB (ref 15–65)

## 2017-05-02 ENCOUNTER — Ambulatory Visit (HOSPITAL_COMMUNITY)
Admission: RE | Admit: 2017-05-02 | Discharge: 2017-05-02 | Disposition: A | Discharge status: Home / Self Care | Payer: Medicare HMO | Source: Ambulatory Visit | Attending: Nephrology | Admitting: Nephrology

## 2017-05-02 VITALS — BP 101/66 | HR 83 | Temp 98.7°F | Resp 18

## 2017-05-02 DIAGNOSIS — D631 Anemia in chronic kidney disease: Secondary | ICD-10-CM | POA: Insufficient documentation

## 2017-05-02 DIAGNOSIS — T451X5A Adverse effect of antineoplastic and immunosuppressive drugs, initial encounter: Secondary | ICD-10-CM | POA: Diagnosis not present

## 2017-05-02 DIAGNOSIS — N184 Chronic kidney disease, stage 4 (severe): Secondary | ICD-10-CM | POA: Insufficient documentation

## 2017-05-02 DIAGNOSIS — T451X5D Adverse effect of antineoplastic and immunosuppressive drugs, subsequent encounter: Secondary | ICD-10-CM

## 2017-05-02 LAB — POCT HEMOGLOBIN-HEMACUE: Hemoglobin: 8.8 g/dL — ABNORMAL LOW (ref 12.0–15.0)

## 2017-05-02 MED ORDER — DARBEPOETIN ALFA 300 MCG/0.6ML IJ SOSY
300.0000 ug | PREFILLED_SYRINGE | INTRAMUSCULAR | Status: DC
  Administered 2017-05-02: 14:00:00 300 ug via SUBCUTANEOUS
  Filled 2017-05-02: qty 0.6

## 2017-05-02 MED ORDER — DARBEPOETIN ALFA 150 MCG/0.3ML IJ SOSY
PREFILLED_SYRINGE | INTRAMUSCULAR | Status: AC
  Filled 2017-05-02: qty 0.6

## 2017-05-03 LAB — CMV DNA, QUANTITATIVE, PCR
CMV DNA QUANT: NEGATIVE [IU]/mL
Log10 CMV Qn DNA Pl: UNDETERMINED log10 IU/mL

## 2017-05-04 LAB — TACROLIMUS LEVEL: TACROLIMUS (FK506) - LABCORP: 11.1 ng/mL (ref 2.0–20.0)

## 2017-05-06 LAB — EPSTEIN BARR VRS(EBV DNA BY PCR)
EBV DNA QN BY PCR: 153 {copies}/mL
log10 EBV DNA Qn PCR: 2.185 log10 copy/mL

## 2017-05-10 LAB — BK QUANT PCR (PLASMA/SERUM)
BK QUANTITATION PCR: NEGATIVE {copies}/mL
Log10 BK Qn PCR: UNDETERMINED log10copy/mL

## 2017-05-15 ENCOUNTER — Encounter (HOSPITAL_COMMUNITY): Payer: Medicare HMO

## 2017-05-16 ENCOUNTER — Encounter (HOSPITAL_COMMUNITY)
Admission: RE | Admit: 2017-05-16 | Discharge: 2017-05-16 | Disposition: A | Discharge status: Home / Self Care | Payer: Medicare HMO | Source: Ambulatory Visit | Attending: Nephrology | Admitting: Nephrology

## 2017-05-16 VITALS — BP 109/72 | HR 83 | Temp 98.7°F | Resp 18

## 2017-05-16 DIAGNOSIS — T451X5D Adverse effect of antineoplastic and immunosuppressive drugs, subsequent encounter: Secondary | ICD-10-CM

## 2017-05-16 DIAGNOSIS — N184 Chronic kidney disease, stage 4 (severe): Secondary | ICD-10-CM | POA: Diagnosis not present

## 2017-05-16 DIAGNOSIS — D631 Anemia in chronic kidney disease: Secondary | ICD-10-CM | POA: Diagnosis not present

## 2017-05-16 DIAGNOSIS — Z940 Kidney transplant status: Secondary | ICD-10-CM | POA: Insufficient documentation

## 2017-05-16 DIAGNOSIS — T451X5A Adverse effect of antineoplastic and immunosuppressive drugs, initial encounter: Secondary | ICD-10-CM

## 2017-05-16 LAB — URINALYSIS, COMPLETE (UACMP) WITH MICROSCOPIC
Bilirubin Urine: NEGATIVE
GLUCOSE, UA: NEGATIVE mg/dL
KETONES UR: NEGATIVE mg/dL
Leukocytes, UA: NEGATIVE
Nitrite: NEGATIVE
PH: 5 (ref 5.0–8.0)
PROTEIN: 100 mg/dL — AB
Specific Gravity, Urine: 1.016 (ref 1.005–1.030)

## 2017-05-16 LAB — RENAL FUNCTION PANEL
ALBUMIN: 4 g/dL (ref 3.5–5.0)
Anion gap: 9 (ref 5–15)
BUN: 45 mg/dL — AB (ref 6–20)
CO2: 19 mmol/L — ABNORMAL LOW (ref 22–32)
CREATININE: 5.59 mg/dL — AB (ref 0.44–1.00)
Calcium: 9.3 mg/dL (ref 8.9–10.3)
Chloride: 113 mmol/L — ABNORMAL HIGH (ref 101–111)
GFR calc Af Amer: 9 mL/min — ABNORMAL LOW (ref >60)
GFR, EST NON AFRICAN AMERICAN: 8 mL/min — AB (ref >60)
GLUCOSE: 106 mg/dL — AB (ref 65–99)
PHOSPHORUS: 3.8 mg/dL (ref 2.5–4.6)
Potassium: 4.6 mmol/L (ref 3.5–5.1)
Sodium: 141 mmol/L (ref 135–145)

## 2017-05-16 LAB — PROTEIN / CREATININE RATIO, URINE
Creatinine, Urine: 165.29 mg/dL
TOTAL PROTEIN, URINE: 209 mg/dL

## 2017-05-16 LAB — IRON AND TIBC
Iron: 102 ug/dL (ref 28–170)
SATURATION RATIOS: 47 % — AB (ref 10.4–31.8)
TIBC: 218 ug/dL — AB (ref 250–450)
UIBC: 116 ug/dL

## 2017-05-16 LAB — POCT HEMOGLOBIN-HEMACUE: HEMOGLOBIN: 9.8 g/dL — AB (ref 12.0–15.0)

## 2017-05-16 LAB — FERRITIN: Ferritin: 488 ng/mL — ABNORMAL HIGH (ref 11–307)

## 2017-05-16 MED ORDER — DARBEPOETIN ALFA 300 MCG/0.6ML IJ SOSY
300.0000 ug | PREFILLED_SYRINGE | INTRAMUSCULAR | Status: DC
  Filled 2017-05-16: qty 0.6

## 2017-05-16 MED ORDER — DARBEPOETIN ALFA 100 MCG/0.5ML IJ SOSY
PREFILLED_SYRINGE | INTRAMUSCULAR | Status: AC
  Administered 2017-05-16: 100 ug via SUBCUTANEOUS
  Filled 2017-05-16: qty 0.5

## 2017-05-16 MED ORDER — DARBEPOETIN ALFA 200 MCG/0.4ML IJ SOSY
PREFILLED_SYRINGE | INTRAMUSCULAR | Status: AC
  Administered 2017-05-16: 200 ug via SUBCUTANEOUS
  Filled 2017-05-16: qty 0.4

## 2017-05-30 ENCOUNTER — Encounter (HOSPITAL_COMMUNITY): Payer: Medicare HMO

## 2017-06-02 ENCOUNTER — Ambulatory Visit (HOSPITAL_COMMUNITY)
Admission: RE | Admit: 2017-06-02 | Discharge: 2017-06-02 | Disposition: A | Discharge status: Home / Self Care | Payer: Medicare HMO | Source: Ambulatory Visit | Attending: Nephrology | Admitting: Nephrology

## 2017-06-02 VITALS — BP 122/86 | HR 84 | Temp 98.3°F | Resp 20

## 2017-06-02 DIAGNOSIS — T451X5D Adverse effect of antineoplastic and immunosuppressive drugs, subsequent encounter: Secondary | ICD-10-CM

## 2017-06-02 DIAGNOSIS — D631 Anemia in chronic kidney disease: Secondary | ICD-10-CM | POA: Insufficient documentation

## 2017-06-02 DIAGNOSIS — N184 Chronic kidney disease, stage 4 (severe): Secondary | ICD-10-CM | POA: Insufficient documentation

## 2017-06-02 DIAGNOSIS — T451X5A Adverse effect of antineoplastic and immunosuppressive drugs, initial encounter: Secondary | ICD-10-CM

## 2017-06-02 LAB — POCT HEMOGLOBIN-HEMACUE: Hemoglobin: 10.3 g/dL — ABNORMAL LOW (ref 12.0–15.0)

## 2017-06-02 MED ORDER — DARBEPOETIN ALFA 150 MCG/0.3ML IJ SOSY
PREFILLED_SYRINGE | INTRAMUSCULAR | Status: AC
  Filled 2017-06-02: qty 0.6

## 2017-06-02 MED ORDER — DARBEPOETIN ALFA 300 MCG/0.6ML IJ SOSY
300.0000 ug | PREFILLED_SYRINGE | INTRAMUSCULAR | Status: DC
  Administered 2017-06-02: 13:00:00 300 ug via SUBCUTANEOUS
  Filled 2017-06-02: qty 0.6

## 2017-06-03 LAB — BK QUANT PCR (PLASMA/SERUM)
BK QUANTITATION PCR: NEGATIVE {copies}/mL
LOG10 BK QN PCR: UNDETERMINED {Log_copies}/mL

## 2017-06-03 LAB — EPSTEIN BARR VRS(EBV DNA BY PCR)
EBV DNA QN by PCR: NEGATIVE copies/mL
log10 EBV DNA Qn PCR: UNDETERMINED log10 copy/mL

## 2017-06-04 LAB — TACROLIMUS LEVEL: Tacrolimus (FK506) - LabCorp: 6.2 ng/mL (ref 2.0–20.0)

## 2017-06-07 LAB — CMV DNA, QUANTITATIVE, PCR
CMV DNA Quant: POSITIVE IU/mL
LOG10 CMV QN DNA PL: UNDETERMINED {Log_IU}/mL

## 2017-06-13 ENCOUNTER — Encounter (HOSPITAL_COMMUNITY): Payer: Medicare HMO

## 2017-06-16 ENCOUNTER — Encounter (HOSPITAL_COMMUNITY): Payer: Medicare HMO

## 2017-06-20 ENCOUNTER — Encounter (HOSPITAL_COMMUNITY)
Admission: RE | Admit: 2017-06-20 | Discharge: 2017-06-20 | Disposition: A | Discharge status: Home / Self Care | Payer: Medicare HMO | Source: Ambulatory Visit | Attending: Nephrology | Admitting: Nephrology

## 2017-06-20 VITALS — BP 135/80 | HR 81 | Temp 99.2°F | Resp 18

## 2017-06-20 DIAGNOSIS — D631 Anemia in chronic kidney disease: Secondary | ICD-10-CM | POA: Insufficient documentation

## 2017-06-20 DIAGNOSIS — Z940 Kidney transplant status: Secondary | ICD-10-CM | POA: Diagnosis not present

## 2017-06-20 DIAGNOSIS — T451X5D Adverse effect of antineoplastic and immunosuppressive drugs, subsequent encounter: Secondary | ICD-10-CM

## 2017-06-20 DIAGNOSIS — N184 Chronic kidney disease, stage 4 (severe): Secondary | ICD-10-CM | POA: Insufficient documentation

## 2017-06-20 LAB — POCT HEMOGLOBIN-HEMACUE: HEMOGLOBIN: 10.7 g/dL — AB (ref 12.0–15.0)

## 2017-06-20 LAB — RENAL FUNCTION PANEL
ANION GAP: 13 (ref 5–15)
Albumin: 3.9 g/dL (ref 3.5–5.0)
BUN: 59 mg/dL — ABNORMAL HIGH (ref 6–20)
CALCIUM: 8.9 mg/dL (ref 8.9–10.3)
CO2: 18 mmol/L — ABNORMAL LOW (ref 22–32)
CREATININE: 5.57 mg/dL — AB (ref 0.44–1.00)
Chloride: 113 mmol/L — ABNORMAL HIGH (ref 101–111)
GFR, EST AFRICAN AMERICAN: 9 mL/min — AB (ref >60)
GFR, EST NON AFRICAN AMERICAN: 8 mL/min — AB (ref >60)
Glucose, Bld: 96 mg/dL (ref 65–99)
Phosphorus: 4.6 mg/dL (ref 2.5–4.6)
Potassium: 4.4 mmol/L (ref 3.5–5.1)
SODIUM: 144 mmol/L (ref 135–145)

## 2017-06-20 LAB — IRON AND TIBC
Iron: 89 ug/dL (ref 28–170)
Saturation Ratios: 40 % — ABNORMAL HIGH (ref 10.4–31.8)
TIBC: 220 ug/dL — ABNORMAL LOW (ref 250–450)
UIBC: 131 ug/dL

## 2017-06-20 LAB — FERRITIN: FERRITIN: 578 ng/mL — AB (ref 11–307)

## 2017-06-20 MED ORDER — DARBEPOETIN ALFA 150 MCG/0.3ML IJ SOSY
PREFILLED_SYRINGE | INTRAMUSCULAR | Status: AC
  Filled 2017-06-20: qty 0.6

## 2017-06-20 MED ORDER — DARBEPOETIN ALFA 300 MCG/0.6ML IJ SOSY
300.0000 ug | PREFILLED_SYRINGE | INTRAMUSCULAR | Status: DC
  Administered 2017-06-20: 300 ug via SUBCUTANEOUS
  Filled 2017-06-20: qty 0.6

## 2017-07-04 ENCOUNTER — Encounter (HOSPITAL_COMMUNITY): Payer: Medicare HMO

## 2017-07-06 ENCOUNTER — Encounter (HOSPITAL_COMMUNITY): Payer: Self-pay

## 2017-07-06 ENCOUNTER — Ambulatory Visit (HOSPITAL_COMMUNITY)
Admission: RE | Admit: 2017-07-06 | Discharge: 2017-07-06 | Disposition: A | Discharge status: Home / Self Care | Payer: Medicare HMO | Source: Ambulatory Visit | Attending: Nephrology | Admitting: Nephrology

## 2017-07-06 VITALS — BP 124/81 | HR 81

## 2017-07-06 DIAGNOSIS — D631 Anemia in chronic kidney disease: Secondary | ICD-10-CM | POA: Diagnosis present

## 2017-07-06 DIAGNOSIS — N184 Chronic kidney disease, stage 4 (severe): Secondary | ICD-10-CM | POA: Insufficient documentation

## 2017-07-06 DIAGNOSIS — T451X5D Adverse effect of antineoplastic and immunosuppressive drugs, subsequent encounter: Secondary | ICD-10-CM

## 2017-07-06 LAB — POCT HEMOGLOBIN-HEMACUE: Hemoglobin: 10.9 g/dL — ABNORMAL LOW (ref 12.0–15.0)

## 2017-07-06 MED ORDER — DARBEPOETIN ALFA 300 MCG/0.6ML IJ SOSY
300.0000 ug | PREFILLED_SYRINGE | INTRAMUSCULAR | Status: DC
  Filled 2017-07-06: qty 0.6

## 2017-07-06 MED ORDER — DARBEPOETIN ALFA 150 MCG/0.3ML IJ SOSY
PREFILLED_SYRINGE | INTRAMUSCULAR | Status: AC
  Administered 2017-07-06: 300 ug
  Filled 2017-07-06: qty 0.6

## 2017-07-19 ENCOUNTER — Encounter (HOSPITAL_COMMUNITY): Payer: Medicare HMO

## 2017-07-20 ENCOUNTER — Ambulatory Visit (HOSPITAL_COMMUNITY)
Admission: RE | Admit: 2017-07-20 | Discharge: 2017-07-20 | Disposition: A | Discharge status: Home / Self Care | Payer: Medicare HMO | Source: Ambulatory Visit | Attending: Nephrology | Admitting: Nephrology

## 2017-07-20 VITALS — BP 116/82 | HR 88 | Temp 99.3°F | Resp 18

## 2017-07-20 DIAGNOSIS — N184 Chronic kidney disease, stage 4 (severe): Secondary | ICD-10-CM | POA: Diagnosis present

## 2017-07-20 DIAGNOSIS — T451X5D Adverse effect of antineoplastic and immunosuppressive drugs, subsequent encounter: Secondary | ICD-10-CM

## 2017-07-20 DIAGNOSIS — D631 Anemia in chronic kidney disease: Secondary | ICD-10-CM | POA: Diagnosis present

## 2017-07-20 LAB — RENAL FUNCTION PANEL
ANION GAP: 9 (ref 5–15)
Albumin: 3.9 g/dL (ref 3.5–5.0)
BUN: 44 mg/dL — ABNORMAL HIGH (ref 6–20)
CALCIUM: 8.8 mg/dL — AB (ref 8.9–10.3)
CO2: 19 mmol/L — AB (ref 22–32)
Chloride: 115 mmol/L — ABNORMAL HIGH (ref 101–111)
Creatinine, Ser: 5.93 mg/dL — ABNORMAL HIGH (ref 0.44–1.00)
GFR, EST AFRICAN AMERICAN: 8 mL/min — AB (ref >60)
GFR, EST NON AFRICAN AMERICAN: 7 mL/min — AB (ref >60)
Glucose, Bld: 100 mg/dL — ABNORMAL HIGH (ref 65–99)
Phosphorus: 4.9 mg/dL — ABNORMAL HIGH (ref 2.5–4.6)
Potassium: 4.5 mmol/L (ref 3.5–5.1)
Sodium: 143 mmol/L (ref 135–145)

## 2017-07-20 LAB — IRON AND TIBC
IRON: 95 ug/dL (ref 28–170)
Saturation Ratios: 43 % — ABNORMAL HIGH (ref 10.4–31.8)
TIBC: 220 ug/dL — AB (ref 250–450)
UIBC: 125 ug/dL

## 2017-07-20 LAB — FERRITIN: Ferritin: 444 ng/mL — ABNORMAL HIGH (ref 11–307)

## 2017-07-20 LAB — POCT HEMOGLOBIN-HEMACUE: HEMOGLOBIN: 12.2 g/dL (ref 12.0–15.0)

## 2017-07-20 MED ORDER — DARBEPOETIN ALFA 100 MCG/0.5ML IJ SOSY
PREFILLED_SYRINGE | INTRAMUSCULAR | Status: AC
  Filled 2017-07-20: qty 0.5

## 2017-07-20 MED ORDER — DARBEPOETIN ALFA 200 MCG/0.4ML IJ SOSY
PREFILLED_SYRINGE | INTRAMUSCULAR | Status: AC
  Filled 2017-07-20: qty 0.4

## 2017-07-20 MED ORDER — DARBEPOETIN ALFA 300 MCG/0.6ML IJ SOSY
300.0000 ug | PREFILLED_SYRINGE | INTRAMUSCULAR | Status: DC
  Filled 2017-07-20: qty 0.6

## 2017-07-21 LAB — PTH, INTACT AND CALCIUM
CALCIUM TOTAL (PTH): 8.8 mg/dL (ref 8.7–10.2)
PTH: 830 pg/mL — AB (ref 15–65)

## 2017-08-03 ENCOUNTER — Ambulatory Visit (HOSPITAL_COMMUNITY)
Admission: RE | Admit: 2017-08-03 | Discharge: 2017-08-03 | Disposition: A | Discharge status: Home / Self Care | Payer: Medicare HMO | Source: Ambulatory Visit | Attending: Nephrology | Admitting: Nephrology

## 2017-08-03 VITALS — BP 115/82 | HR 80 | Temp 98.7°F | Resp 20

## 2017-08-03 DIAGNOSIS — D631 Anemia in chronic kidney disease: Secondary | ICD-10-CM | POA: Diagnosis present

## 2017-08-03 DIAGNOSIS — N184 Chronic kidney disease, stage 4 (severe): Secondary | ICD-10-CM | POA: Diagnosis present

## 2017-08-03 DIAGNOSIS — T451X5D Adverse effect of antineoplastic and immunosuppressive drugs, subsequent encounter: Secondary | ICD-10-CM

## 2017-08-03 LAB — POCT HEMOGLOBIN-HEMACUE: HEMOGLOBIN: 11.1 g/dL — AB (ref 12.0–15.0)

## 2017-08-03 MED ORDER — DARBEPOETIN ALFA 150 MCG/0.3ML IJ SOSY
PREFILLED_SYRINGE | INTRAMUSCULAR | Status: AC
  Filled 2017-08-03: qty 0.6

## 2017-08-03 MED ORDER — DARBEPOETIN ALFA 300 MCG/0.6ML IJ SOSY
300.0000 ug | PREFILLED_SYRINGE | INTRAMUSCULAR | Status: DC
  Administered 2017-08-03: 300 ug via SUBCUTANEOUS
  Filled 2017-08-03: qty 0.6

## 2017-08-17 ENCOUNTER — Ambulatory Visit (HOSPITAL_COMMUNITY)
Admission: RE | Admit: 2017-08-17 | Discharge: 2017-08-17 | Disposition: A | Discharge status: Home / Self Care | Payer: Medicare HMO | Source: Ambulatory Visit | Attending: Nephrology | Admitting: Nephrology

## 2017-08-17 VITALS — BP 129/71 | HR 83 | Temp 98.0°F | Resp 18

## 2017-08-17 DIAGNOSIS — T451X5D Adverse effect of antineoplastic and immunosuppressive drugs, subsequent encounter: Secondary | ICD-10-CM

## 2017-08-17 DIAGNOSIS — N184 Chronic kidney disease, stage 4 (severe): Secondary | ICD-10-CM | POA: Diagnosis present

## 2017-08-17 DIAGNOSIS — D631 Anemia in chronic kidney disease: Secondary | ICD-10-CM

## 2017-08-17 LAB — RENAL FUNCTION PANEL
ANION GAP: 10 (ref 5–15)
Albumin: 3.7 g/dL (ref 3.5–5.0)
BUN: 49 mg/dL — ABNORMAL HIGH (ref 6–20)
CHLORIDE: 115 mmol/L — AB (ref 101–111)
CO2: 18 mmol/L — AB (ref 22–32)
Calcium: 8.9 mg/dL (ref 8.9–10.3)
Creatinine, Ser: 5.84 mg/dL — ABNORMAL HIGH (ref 0.44–1.00)
GFR calc non Af Amer: 7 mL/min — ABNORMAL LOW (ref >60)
GFR, EST AFRICAN AMERICAN: 8 mL/min — AB (ref >60)
Glucose, Bld: 98 mg/dL (ref 65–99)
Phosphorus: 3.8 mg/dL (ref 2.5–4.6)
Potassium: 4.8 mmol/L (ref 3.5–5.1)
Sodium: 143 mmol/L (ref 135–145)

## 2017-08-17 LAB — IRON AND TIBC
IRON: 130 ug/dL (ref 28–170)
Saturation Ratios: 63 % — ABNORMAL HIGH (ref 10.4–31.8)
TIBC: 207 ug/dL — AB (ref 250–450)
UIBC: 77 ug/dL

## 2017-08-17 LAB — FERRITIN: Ferritin: 485 ng/mL — ABNORMAL HIGH (ref 11–307)

## 2017-08-17 LAB — POCT HEMOGLOBIN-HEMACUE: HEMOGLOBIN: 11.3 g/dL — AB (ref 12.0–15.0)

## 2017-08-17 MED ORDER — DARBEPOETIN ALFA 300 MCG/0.6ML IJ SOSY
300.0000 ug | PREFILLED_SYRINGE | INTRAMUSCULAR | Status: DC
  Administered 2017-08-17: 300 ug via SUBCUTANEOUS
  Filled 2017-08-17: qty 0.6

## 2017-08-17 MED ORDER — DARBEPOETIN ALFA 150 MCG/0.3ML IJ SOSY
PREFILLED_SYRINGE | INTRAMUSCULAR | Status: AC
  Administered 2017-08-17: 300 ug via SUBCUTANEOUS
  Filled 2017-08-17: qty 0.6

## 2017-08-30 ENCOUNTER — Encounter (HOSPITAL_COMMUNITY): Payer: Medicare HMO

## 2017-09-04 ENCOUNTER — Ambulatory Visit (HOSPITAL_COMMUNITY)
Admission: RE | Admit: 2017-09-04 | Discharge: 2017-09-04 | Disposition: A | Discharge status: Home / Self Care | Payer: Medicare HMO | Source: Ambulatory Visit | Attending: Nephrology | Admitting: Nephrology

## 2017-09-04 DIAGNOSIS — T451X5D Adverse effect of antineoplastic and immunosuppressive drugs, subsequent encounter: Secondary | ICD-10-CM

## 2017-09-04 DIAGNOSIS — N184 Chronic kidney disease, stage 4 (severe): Secondary | ICD-10-CM | POA: Diagnosis not present

## 2017-09-04 DIAGNOSIS — D631 Anemia in chronic kidney disease: Secondary | ICD-10-CM | POA: Diagnosis present

## 2017-09-04 LAB — POCT HEMOGLOBIN-HEMACUE: HEMOGLOBIN: 12.4 g/dL (ref 12.0–15.0)

## 2017-09-04 MED ORDER — DARBEPOETIN ALFA 150 MCG/0.3ML IJ SOSY
PREFILLED_SYRINGE | INTRAMUSCULAR | Status: AC
  Filled 2017-09-04: qty 0.6

## 2017-09-04 MED ORDER — DARBEPOETIN ALFA 300 MCG/0.6ML IJ SOSY
300.0000 ug | PREFILLED_SYRINGE | INTRAMUSCULAR | Status: DC
  Filled 2017-09-04: qty 0.6

## 2017-09-15 ENCOUNTER — Other Ambulatory Visit (HOSPITAL_COMMUNITY): Payer: Self-pay | Admitting: *Deleted

## 2017-09-18 ENCOUNTER — Ambulatory Visit (HOSPITAL_COMMUNITY)
Admission: RE | Admit: 2017-09-18 | Discharge: 2017-09-18 | Disposition: A | Discharge status: Home / Self Care | Payer: Medicare HMO | Source: Ambulatory Visit | Attending: Nephrology | Admitting: Nephrology

## 2017-09-18 VITALS — BP 147/93 | HR 80 | Temp 98.4°F

## 2017-09-18 DIAGNOSIS — T451X5D Adverse effect of antineoplastic and immunosuppressive drugs, subsequent encounter: Secondary | ICD-10-CM | POA: Insufficient documentation

## 2017-09-18 DIAGNOSIS — Y848 Other medical procedures as the cause of abnormal reaction of the patient, or of later complication, without mention of misadventure at the time of the procedure: Secondary | ICD-10-CM | POA: Insufficient documentation

## 2017-09-18 DIAGNOSIS — D631 Anemia in chronic kidney disease: Secondary | ICD-10-CM | POA: Diagnosis present

## 2017-09-18 DIAGNOSIS — N184 Chronic kidney disease, stage 4 (severe): Secondary | ICD-10-CM | POA: Diagnosis not present

## 2017-09-18 LAB — RENAL FUNCTION PANEL
ALBUMIN: 3.7 g/dL (ref 3.5–5.0)
Anion gap: 10 (ref 5–15)
BUN: 53 mg/dL — ABNORMAL HIGH (ref 6–20)
CO2: 17 mmol/L — ABNORMAL LOW (ref 22–32)
Calcium: 8.8 mg/dL — ABNORMAL LOW (ref 8.9–10.3)
Chloride: 114 mmol/L — ABNORMAL HIGH (ref 98–111)
Creatinine, Ser: 5.69 mg/dL — ABNORMAL HIGH (ref 0.44–1.00)
GFR calc Af Amer: 9 mL/min — ABNORMAL LOW (ref >60)
GFR, EST NON AFRICAN AMERICAN: 7 mL/min — AB (ref >60)
Glucose, Bld: 92 mg/dL (ref 70–99)
Phosphorus: 4.4 mg/dL (ref 2.5–4.6)
Potassium: 4.5 mmol/L (ref 3.5–5.1)
SODIUM: 141 mmol/L (ref 135–145)

## 2017-09-18 LAB — FERRITIN: Ferritin: 506 ng/mL — ABNORMAL HIGH (ref 11–307)

## 2017-09-18 LAB — IRON AND TIBC
Iron: 172 ug/dL — ABNORMAL HIGH (ref 28–170)
Saturation Ratios: 85 % — ABNORMAL HIGH (ref 10.4–31.8)
TIBC: 203 ug/dL — ABNORMAL LOW (ref 250–450)
UIBC: 31 ug/dL

## 2017-09-18 MED ORDER — DARBEPOETIN ALFA 300 MCG/0.6ML IJ SOSY
300.0000 ug | PREFILLED_SYRINGE | INTRAMUSCULAR | Status: DC
  Administered 2017-09-18: 300 ug via SUBCUTANEOUS
  Filled 2017-09-18: qty 0.6

## 2017-09-18 MED ORDER — DARBEPOETIN ALFA 150 MCG/0.3ML IJ SOSY
PREFILLED_SYRINGE | INTRAMUSCULAR | Status: AC
  Filled 2017-09-18: qty 0.6

## 2017-09-19 LAB — POCT HEMOGLOBIN-HEMACUE: Hemoglobin: 10.7 g/dL — ABNORMAL LOW (ref 12.0–15.0)

## 2017-10-02 ENCOUNTER — Ambulatory Visit (HOSPITAL_COMMUNITY)
Admission: RE | Admit: 2017-10-02 | Discharge: 2017-10-02 | Disposition: A | Discharge status: Home / Self Care | Payer: Medicare HMO | Source: Ambulatory Visit | Attending: Nephrology | Admitting: Nephrology

## 2017-10-02 VITALS — BP 118/78 | Temp 99.5°F | Resp 15 | Ht 62.0 in | Wt 162.0 lb

## 2017-10-02 DIAGNOSIS — N184 Chronic kidney disease, stage 4 (severe): Secondary | ICD-10-CM | POA: Diagnosis not present

## 2017-10-02 DIAGNOSIS — D631 Anemia in chronic kidney disease: Secondary | ICD-10-CM | POA: Insufficient documentation

## 2017-10-02 DIAGNOSIS — T451X5D Adverse effect of antineoplastic and immunosuppressive drugs, subsequent encounter: Secondary | ICD-10-CM

## 2017-10-02 LAB — POCT HEMOGLOBIN-HEMACUE: Hemoglobin: 10.1 g/dL — ABNORMAL LOW (ref 12.0–15.0)

## 2017-10-02 MED ORDER — DARBEPOETIN ALFA 150 MCG/0.3ML IJ SOSY
PREFILLED_SYRINGE | INTRAMUSCULAR | Status: AC
  Administered 2017-10-02: 300 ug
  Filled 2017-10-02: qty 0.6

## 2017-10-02 MED ORDER — DARBEPOETIN ALFA 100 MCG/0.5ML IJ SOSY: 100.0000 ug | PREFILLED_SYRINGE | INTRAMUSCULAR | Status: DC

## 2017-10-02 MED ORDER — DARBEPOETIN ALFA 200 MCG/0.4ML IJ SOSY: 200.0000 ug | PREFILLED_SYRINGE | INTRAMUSCULAR | Status: DC

## 2017-10-16 ENCOUNTER — Encounter (HOSPITAL_COMMUNITY): Payer: Medicare HMO

## 2017-10-18 ENCOUNTER — Encounter (HOSPITAL_COMMUNITY): Payer: Medicare HMO

## 2017-10-23 ENCOUNTER — Ambulatory Visit (HOSPITAL_COMMUNITY)
Admission: RE | Admit: 2017-10-23 | Discharge: 2017-10-23 | Disposition: A | Discharge status: Home / Self Care | Payer: Medicare HMO | Source: Ambulatory Visit | Attending: Nephrology | Admitting: Nephrology

## 2017-10-23 VITALS — BP 126/93 | HR 74 | Temp 97.7°F | Ht 62.0 in | Wt 163.0 lb

## 2017-10-23 DIAGNOSIS — T451X5D Adverse effect of antineoplastic and immunosuppressive drugs, subsequent encounter: Secondary | ICD-10-CM

## 2017-10-23 DIAGNOSIS — D631 Anemia in chronic kidney disease: Secondary | ICD-10-CM | POA: Diagnosis not present

## 2017-10-23 DIAGNOSIS — N184 Chronic kidney disease, stage 4 (severe): Secondary | ICD-10-CM | POA: Diagnosis not present

## 2017-10-23 DIAGNOSIS — Z5181 Encounter for therapeutic drug level monitoring: Secondary | ICD-10-CM | POA: Insufficient documentation

## 2017-10-23 DIAGNOSIS — Z79899 Other long term (current) drug therapy: Secondary | ICD-10-CM | POA: Insufficient documentation

## 2017-10-23 LAB — RENAL FUNCTION PANEL
Albumin: 3.8 g/dL (ref 3.5–5.0)
Anion gap: 11 (ref 5–15)
BUN: 52 mg/dL — AB (ref 6–20)
CALCIUM: 9 mg/dL (ref 8.9–10.3)
CO2: 19 mmol/L — ABNORMAL LOW (ref 22–32)
CREATININE: 6.26 mg/dL — AB (ref 0.44–1.00)
Chloride: 114 mmol/L — ABNORMAL HIGH (ref 98–111)
GFR calc non Af Amer: 7 mL/min — ABNORMAL LOW (ref >60)
GFR, EST AFRICAN AMERICAN: 8 mL/min — AB (ref >60)
GLUCOSE: 120 mg/dL — AB (ref 70–99)
Phosphorus: 4.1 mg/dL (ref 2.5–4.6)
Potassium: 4.8 mmol/L (ref 3.5–5.1)
SODIUM: 144 mmol/L (ref 135–145)

## 2017-10-23 LAB — POCT HEMOGLOBIN-HEMACUE: Hemoglobin: 9.7 g/dL — ABNORMAL LOW (ref 12.0–15.0)

## 2017-10-23 LAB — IRON AND TIBC
Iron: 122 ug/dL (ref 28–170)
SATURATION RATIOS: 56 % — AB (ref 10.4–31.8)
TIBC: 218 ug/dL — AB (ref 250–450)
UIBC: 96 ug/dL

## 2017-10-23 LAB — FERRITIN: FERRITIN: 585 ng/mL — AB (ref 11–307)

## 2017-10-23 MED ORDER — DARBEPOETIN ALFA 300 MCG/0.6ML IJ SOSY
300.0000 ug | PREFILLED_SYRINGE | INTRAMUSCULAR | Status: DC
  Administered 2017-10-23: 300 ug via SUBCUTANEOUS
  Filled 2017-10-23: qty 0.6

## 2017-10-23 MED ORDER — DARBEPOETIN ALFA 150 MCG/0.3ML IJ SOSY
PREFILLED_SYRINGE | INTRAMUSCULAR | Status: AC
  Filled 2017-10-23: qty 0.6

## 2017-10-24 LAB — PTH, INTACT AND CALCIUM
Calcium, Total (PTH): 8.8 mg/dL (ref 8.7–10.2)
PTH: 1846 pg/mL — ABNORMAL HIGH (ref 15–65)

## 2017-11-01 ENCOUNTER — Encounter (HOSPITAL_COMMUNITY): Payer: Medicare HMO

## 2017-11-06 ENCOUNTER — Ambulatory Visit (HOSPITAL_COMMUNITY)
Admission: RE | Admit: 2017-11-06 | Discharge: 2017-11-06 | Disposition: A | Discharge status: Home / Self Care | Payer: Medicare HMO | Source: Ambulatory Visit | Attending: Nephrology | Admitting: Nephrology

## 2017-11-06 VITALS — BP 113/79 | HR 75 | Resp 16

## 2017-11-06 DIAGNOSIS — N184 Chronic kidney disease, stage 4 (severe): Secondary | ICD-10-CM | POA: Insufficient documentation

## 2017-11-06 DIAGNOSIS — D631 Anemia in chronic kidney disease: Secondary | ICD-10-CM

## 2017-11-06 DIAGNOSIS — T451X5D Adverse effect of antineoplastic and immunosuppressive drugs, subsequent encounter: Secondary | ICD-10-CM

## 2017-11-06 LAB — POCT HEMOGLOBIN-HEMACUE: Hemoglobin: 10.5 g/dL — ABNORMAL LOW (ref 12.0–15.0)

## 2017-11-06 MED ORDER — DARBEPOETIN ALFA 100 MCG/0.5ML IJ SOSY
PREFILLED_SYRINGE | INTRAMUSCULAR | Status: AC
  Administered 2017-11-06: 100 ug
  Filled 2017-11-06: qty 0.5

## 2017-11-06 MED ORDER — DARBEPOETIN ALFA 300 MCG/0.6ML IJ SOSY
300.0000 ug | PREFILLED_SYRINGE | INTRAMUSCULAR | Status: DC
  Filled 2017-11-06: qty 0.6

## 2017-11-06 MED ORDER — DARBEPOETIN ALFA 200 MCG/0.4ML IJ SOSY
PREFILLED_SYRINGE | INTRAMUSCULAR | Status: AC
  Administered 2017-11-06: 200 ug
  Filled 2017-11-06: qty 0.4

## 2017-11-20 ENCOUNTER — Ambulatory Visit (HOSPITAL_COMMUNITY)
Admission: RE | Admit: 2017-11-20 | Discharge: 2017-11-20 | Disposition: A | Discharge status: Home / Self Care | Payer: Medicare HMO | Source: Ambulatory Visit | Attending: Nephrology | Admitting: Nephrology

## 2017-11-20 VITALS — BP 134/79 | HR 76 | Temp 98.8°F | Resp 20

## 2017-11-20 DIAGNOSIS — Z79899 Other long term (current) drug therapy: Secondary | ICD-10-CM | POA: Insufficient documentation

## 2017-11-20 DIAGNOSIS — D631 Anemia in chronic kidney disease: Secondary | ICD-10-CM | POA: Diagnosis not present

## 2017-11-20 DIAGNOSIS — T451X5D Adverse effect of antineoplastic and immunosuppressive drugs, subsequent encounter: Secondary | ICD-10-CM

## 2017-11-20 DIAGNOSIS — Z5181 Encounter for therapeutic drug level monitoring: Secondary | ICD-10-CM | POA: Insufficient documentation

## 2017-11-20 DIAGNOSIS — N184 Chronic kidney disease, stage 4 (severe): Secondary | ICD-10-CM | POA: Diagnosis not present

## 2017-11-20 LAB — RENAL FUNCTION PANEL
ALBUMIN: 3.8 g/dL (ref 3.5–5.0)
Anion gap: 13 (ref 5–15)
BUN: 45 mg/dL — ABNORMAL HIGH (ref 6–20)
CALCIUM: 8.4 mg/dL — AB (ref 8.9–10.3)
CO2: 17 mmol/L — AB (ref 22–32)
CREATININE: 6.33 mg/dL — AB (ref 0.44–1.00)
Chloride: 112 mmol/L — ABNORMAL HIGH (ref 98–111)
GFR, EST AFRICAN AMERICAN: 8 mL/min — AB (ref >60)
GFR, EST NON AFRICAN AMERICAN: 6 mL/min — AB (ref >60)
Glucose, Bld: 100 mg/dL — ABNORMAL HIGH (ref 70–99)
PHOSPHORUS: 4.6 mg/dL (ref 2.5–4.6)
Potassium: 4.7 mmol/L (ref 3.5–5.1)
SODIUM: 142 mmol/L (ref 135–145)

## 2017-11-20 LAB — IRON AND TIBC
Iron: 67 ug/dL (ref 28–170)
Saturation Ratios: 32 % — ABNORMAL HIGH (ref 10.4–31.8)
TIBC: 210 ug/dL — ABNORMAL LOW (ref 250–450)
UIBC: 143 ug/dL

## 2017-11-20 LAB — FERRITIN: FERRITIN: 518 ng/mL — AB (ref 11–307)

## 2017-11-20 LAB — POCT HEMOGLOBIN-HEMACUE: HEMOGLOBIN: 10.4 g/dL — AB (ref 12.0–15.0)

## 2017-11-20 MED ORDER — DARBEPOETIN ALFA 300 MCG/0.6ML IJ SOSY
300.0000 ug | PREFILLED_SYRINGE | INTRAMUSCULAR | Status: DC
  Filled 2017-11-20: qty 0.6

## 2017-11-20 MED ORDER — DARBEPOETIN ALFA 150 MCG/0.3ML IJ SOSY
PREFILLED_SYRINGE | INTRAMUSCULAR | Status: AC
  Administered 2017-11-20: 300 ug
  Filled 2017-11-20: qty 0.6

## 2017-12-04 ENCOUNTER — Encounter (HOSPITAL_COMMUNITY): Payer: Medicare HMO

## 2017-12-15 ENCOUNTER — Ambulatory Visit (HOSPITAL_COMMUNITY)
Admission: RE | Admit: 2017-12-15 | Discharge: 2017-12-15 | Disposition: A | Discharge status: Home / Self Care | Payer: Medicare HMO | Source: Ambulatory Visit | Attending: Nephrology | Admitting: Nephrology

## 2017-12-15 VITALS — BP 137/83 | HR 79 | Resp 20

## 2017-12-15 DIAGNOSIS — D631 Anemia in chronic kidney disease: Secondary | ICD-10-CM | POA: Insufficient documentation

## 2017-12-15 DIAGNOSIS — N184 Chronic kidney disease, stage 4 (severe): Secondary | ICD-10-CM | POA: Diagnosis not present

## 2017-12-15 DIAGNOSIS — T451X5D Adverse effect of antineoplastic and immunosuppressive drugs, subsequent encounter: Secondary | ICD-10-CM

## 2017-12-15 DIAGNOSIS — T451X5A Adverse effect of antineoplastic and immunosuppressive drugs, initial encounter: Secondary | ICD-10-CM

## 2017-12-15 LAB — POCT HEMOGLOBIN-HEMACUE: Hemoglobin: 11.8 g/dL — ABNORMAL LOW (ref 12.0–15.0)

## 2017-12-15 MED ORDER — DARBEPOETIN ALFA 300 MCG/0.6ML IJ SOSY: 300.0000 ug | PREFILLED_SYRINGE | INTRAMUSCULAR | Status: DC

## 2017-12-15 MED ORDER — EPOETIN ALFA-EPBX 10000 UNIT/ML IJ SOLN
20000.0000 [IU] | INTRAMUSCULAR | Status: DC
  Administered 2017-12-15: 20000 [IU] via SUBCUTANEOUS
  Filled 2017-12-15: qty 2

## 2017-12-18 ENCOUNTER — Encounter (HOSPITAL_COMMUNITY): Payer: Medicare HMO

## 2017-12-29 ENCOUNTER — Ambulatory Visit (HOSPITAL_COMMUNITY)
Admission: RE | Admit: 2017-12-29 | Discharge: 2017-12-29 | Disposition: A | Discharge status: Home / Self Care | Payer: Medicare HMO | Source: Ambulatory Visit | Attending: Nephrology | Admitting: Nephrology

## 2017-12-29 VITALS — BP 129/97 | HR 86 | Temp 98.2°F | Resp 16

## 2017-12-29 DIAGNOSIS — N184 Chronic kidney disease, stage 4 (severe): Secondary | ICD-10-CM | POA: Diagnosis not present

## 2017-12-29 DIAGNOSIS — T451X5A Adverse effect of antineoplastic and immunosuppressive drugs, initial encounter: Secondary | ICD-10-CM

## 2017-12-29 LAB — IRON AND TIBC
Iron: 129 ug/dL (ref 28–170)
SATURATION RATIOS: 59 % — AB (ref 10.4–31.8)
TIBC: 218 ug/dL — AB (ref 250–450)
UIBC: 89 ug/dL

## 2017-12-29 LAB — RENAL FUNCTION PANEL
ANION GAP: 7 (ref 5–15)
Albumin: 3.9 g/dL (ref 3.5–5.0)
BUN: 56 mg/dL — ABNORMAL HIGH (ref 6–20)
CALCIUM: 9 mg/dL (ref 8.9–10.3)
CO2: 23 mmol/L (ref 22–32)
CREATININE: 6.11 mg/dL — AB (ref 0.44–1.00)
Chloride: 110 mmol/L (ref 98–111)
GFR calc non Af Amer: 7 mL/min — ABNORMAL LOW (ref >60)
GFR, EST AFRICAN AMERICAN: 8 mL/min — AB (ref >60)
Glucose, Bld: 101 mg/dL — ABNORMAL HIGH (ref 70–99)
Phosphorus: 4.2 mg/dL (ref 2.5–4.6)
Potassium: 4.4 mmol/L (ref 3.5–5.1)
SODIUM: 140 mmol/L (ref 135–145)

## 2017-12-29 LAB — POCT HEMOGLOBIN-HEMACUE: Hemoglobin: 11.5 g/dL — ABNORMAL LOW (ref 12.0–15.0)

## 2017-12-29 LAB — FERRITIN: Ferritin: 611 ng/mL — ABNORMAL HIGH (ref 11–307)

## 2017-12-29 MED ORDER — EPOETIN ALFA-EPBX 10000 UNIT/ML IJ SOLN
20000.0000 [IU] | INTRAMUSCULAR | Status: DC
  Administered 2017-12-29: 20000 [IU] via SUBCUTANEOUS
  Filled 2017-12-29: qty 2

## 2018-01-12 ENCOUNTER — Ambulatory Visit (HOSPITAL_COMMUNITY)
Admission: RE | Admit: 2018-01-12 | Discharge: 2018-01-12 | Disposition: A | Discharge status: Home / Self Care | Payer: Medicare HMO | Source: Ambulatory Visit | Attending: Nephrology | Admitting: Nephrology

## 2018-01-12 VITALS — BP 132/86 | HR 79 | Temp 98.1°F | Resp 18

## 2018-01-12 DIAGNOSIS — T451X5A Adverse effect of antineoplastic and immunosuppressive drugs, initial encounter: Secondary | ICD-10-CM | POA: Diagnosis not present

## 2018-01-12 DIAGNOSIS — N184 Chronic kidney disease, stage 4 (severe): Secondary | ICD-10-CM | POA: Diagnosis present

## 2018-01-12 LAB — POCT HEMOGLOBIN-HEMACUE: Hemoglobin: 11.4 g/dL — ABNORMAL LOW (ref 12.0–15.0)

## 2018-01-12 MED ORDER — EPOETIN ALFA-EPBX 10000 UNIT/ML IJ SOLN
20000.0000 [IU] | INTRAMUSCULAR | Status: DC
  Administered 2018-01-12: 20000 [IU] via SUBCUTANEOUS
  Filled 2018-01-12: qty 2

## 2018-01-29 ENCOUNTER — Encounter (HOSPITAL_COMMUNITY): Payer: Medicare HMO

## 2018-02-02 ENCOUNTER — Encounter (HOSPITAL_COMMUNITY)
Admission: RE | Admit: 2018-02-02 | Discharge: 2018-02-02 | Disposition: A | Discharge status: Home / Self Care | Payer: Medicare HMO | Source: Ambulatory Visit | Attending: Nephrology | Admitting: Nephrology

## 2018-02-02 VITALS — BP 142/94 | HR 79 | Temp 98.6°F | Resp 20

## 2018-02-02 DIAGNOSIS — Z79899 Other long term (current) drug therapy: Secondary | ICD-10-CM | POA: Diagnosis not present

## 2018-02-02 DIAGNOSIS — N184 Chronic kidney disease, stage 4 (severe): Secondary | ICD-10-CM | POA: Diagnosis not present

## 2018-02-02 DIAGNOSIS — D631 Anemia in chronic kidney disease: Secondary | ICD-10-CM | POA: Insufficient documentation

## 2018-02-02 DIAGNOSIS — Z5181 Encounter for therapeutic drug level monitoring: Secondary | ICD-10-CM | POA: Insufficient documentation

## 2018-02-02 DIAGNOSIS — T451X5A Adverse effect of antineoplastic and immunosuppressive drugs, initial encounter: Secondary | ICD-10-CM

## 2018-02-02 LAB — RENAL FUNCTION PANEL
ALBUMIN: 3.9 g/dL (ref 3.5–5.0)
Anion gap: 11 (ref 5–15)
BUN: 59 mg/dL — ABNORMAL HIGH (ref 6–20)
CALCIUM: 8.7 mg/dL — AB (ref 8.9–10.3)
CO2: 17 mmol/L — ABNORMAL LOW (ref 22–32)
Chloride: 117 mmol/L — ABNORMAL HIGH (ref 98–111)
Creatinine, Ser: 6.41 mg/dL — ABNORMAL HIGH (ref 0.44–1.00)
GFR calc Af Amer: 8 mL/min — ABNORMAL LOW (ref >60)
GFR calc non Af Amer: 7 mL/min — ABNORMAL LOW (ref >60)
GLUCOSE: 100 mg/dL — AB (ref 70–99)
PHOSPHORUS: 4.5 mg/dL (ref 2.5–4.6)
Potassium: 5.3 mmol/L — ABNORMAL HIGH (ref 3.5–5.1)
SODIUM: 145 mmol/L (ref 135–145)

## 2018-02-02 LAB — IRON AND TIBC
Iron: 154 ug/dL (ref 28–170)
SATURATION RATIOS: 71 % — AB (ref 10.4–31.8)
TIBC: 218 ug/dL — ABNORMAL LOW (ref 250–450)
UIBC: 64 ug/dL

## 2018-02-02 LAB — FERRITIN: FERRITIN: 654 ng/mL — AB (ref 11–307)

## 2018-02-02 MED ORDER — EPOETIN ALFA-EPBX 10000 UNIT/ML IJ SOLN
20000.0000 [IU] | INTRAMUSCULAR | Status: DC
  Administered 2018-02-02: 20000 [IU] via SUBCUTANEOUS
  Filled 2018-02-02: qty 2

## 2018-02-05 LAB — POCT HEMOGLOBIN-HEMACUE: HEMOGLOBIN: 10.7 g/dL — AB (ref 12.0–15.0)

## 2018-02-16 ENCOUNTER — Ambulatory Visit (HOSPITAL_COMMUNITY)
Admission: RE | Admit: 2018-02-16 | Discharge: 2018-02-16 | Disposition: A | Discharge status: Home / Self Care | Payer: Medicare HMO | Source: Ambulatory Visit | Attending: Nephrology | Admitting: Nephrology

## 2018-02-16 VITALS — BP 131/82 | HR 84 | Temp 98.4°F | Resp 20

## 2018-02-16 DIAGNOSIS — N184 Chronic kidney disease, stage 4 (severe): Secondary | ICD-10-CM | POA: Insufficient documentation

## 2018-02-16 DIAGNOSIS — T451X5A Adverse effect of antineoplastic and immunosuppressive drugs, initial encounter: Secondary | ICD-10-CM | POA: Insufficient documentation

## 2018-02-16 LAB — POCT HEMOGLOBIN-HEMACUE: Hemoglobin: 10.3 g/dL — ABNORMAL LOW (ref 12.0–15.0)

## 2018-02-16 MED ORDER — EPOETIN ALFA-EPBX 10000 UNIT/ML IJ SOLN
20000.0000 [IU] | INTRAMUSCULAR | Status: DC
  Administered 2018-02-16: 20000 [IU] via SUBCUTANEOUS
  Filled 2018-02-16: qty 2

## 2018-02-17 LAB — PTH, INTACT AND CALCIUM
Calcium, Total (PTH): 8.6 mg/dL — ABNORMAL LOW (ref 8.7–10.2)
PTH: 556 pg/mL — ABNORMAL HIGH (ref 15–65)

## 2018-03-02 ENCOUNTER — Encounter (HOSPITAL_COMMUNITY): Payer: Medicare HMO

## 2018-03-07 ENCOUNTER — Ambulatory Visit (HOSPITAL_COMMUNITY)
Admission: RE | Admit: 2018-03-07 | Discharge: 2018-03-07 | Disposition: A | Discharge status: Home / Self Care | Payer: Medicare HMO | Source: Ambulatory Visit | Attending: Nephrology | Admitting: Nephrology

## 2018-03-07 VITALS — BP 132/79 | HR 83 | Temp 99.0°F | Resp 20

## 2018-03-07 DIAGNOSIS — N184 Chronic kidney disease, stage 4 (severe): Secondary | ICD-10-CM | POA: Insufficient documentation

## 2018-03-07 DIAGNOSIS — T451X5A Adverse effect of antineoplastic and immunosuppressive drugs, initial encounter: Secondary | ICD-10-CM | POA: Insufficient documentation

## 2018-03-07 LAB — IRON AND TIBC
Iron: 188 ug/dL — ABNORMAL HIGH (ref 28–170)
Saturation Ratios: 90 % — ABNORMAL HIGH (ref 10.4–31.8)
TIBC: 209 ug/dL — ABNORMAL LOW (ref 250–450)
UIBC: 21 ug/dL

## 2018-03-07 LAB — RENAL FUNCTION PANEL
Albumin: 3.6 g/dL (ref 3.5–5.0)
Anion gap: 9 (ref 5–15)
BUN: 52 mg/dL — ABNORMAL HIGH (ref 6–20)
CHLORIDE: 111 mmol/L (ref 98–111)
CO2: 21 mmol/L — ABNORMAL LOW (ref 22–32)
Calcium: 8.8 mg/dL — ABNORMAL LOW (ref 8.9–10.3)
Creatinine, Ser: 6.43 mg/dL — ABNORMAL HIGH (ref 0.44–1.00)
GFR calc Af Amer: 8 mL/min — ABNORMAL LOW (ref >60)
GFR calc non Af Amer: 6 mL/min — ABNORMAL LOW (ref >60)
Glucose, Bld: 103 mg/dL — ABNORMAL HIGH (ref 70–99)
POTASSIUM: 4.8 mmol/L (ref 3.5–5.1)
Phosphorus: 4.6 mg/dL (ref 2.5–4.6)
Sodium: 141 mmol/L (ref 135–145)

## 2018-03-07 LAB — POCT HEMOGLOBIN-HEMACUE: Hemoglobin: 10.2 g/dL — ABNORMAL LOW (ref 12.0–15.0)

## 2018-03-07 LAB — FERRITIN: Ferritin: 760 ng/mL — ABNORMAL HIGH (ref 11–307)

## 2018-03-07 MED ORDER — EPOETIN ALFA-EPBX 10000 UNIT/ML IJ SOLN
20000.0000 [IU] | INTRAMUSCULAR | Status: DC
  Administered 2018-03-07: 20000 [IU] via SUBCUTANEOUS
  Filled 2018-03-07: qty 2

## 2018-03-21 ENCOUNTER — Ambulatory Visit (HOSPITAL_COMMUNITY)
Admission: RE | Admit: 2018-03-21 | Discharge: 2018-03-21 | Disposition: A | Discharge status: Home / Self Care | Payer: Medicare HMO | Source: Ambulatory Visit | Attending: Nephrology | Admitting: Nephrology

## 2018-03-21 VITALS — BP 120/75 | HR 76 | Temp 97.7°F | Resp 20

## 2018-03-21 DIAGNOSIS — T451X5A Adverse effect of antineoplastic and immunosuppressive drugs, initial encounter: Secondary | ICD-10-CM | POA: Insufficient documentation

## 2018-03-21 DIAGNOSIS — N184 Chronic kidney disease, stage 4 (severe): Secondary | ICD-10-CM | POA: Diagnosis present

## 2018-03-21 LAB — POCT HEMOGLOBIN-HEMACUE: Hemoglobin: 9.6 g/dL — ABNORMAL LOW (ref 12.0–15.0)

## 2018-03-21 MED ORDER — EPOETIN ALFA-EPBX 10000 UNIT/ML IJ SOLN
20000.0000 [IU] | INTRAMUSCULAR | Status: DC
  Administered 2018-03-21: 20000 [IU] via SUBCUTANEOUS
  Filled 2018-03-21: qty 2

## 2018-03-22 LAB — CMV DNA, QUANTITATIVE, PCR
CMV DNA Quant: NEGATIVE IU/mL
Log10 CMV Qn DNA Pl: UNDETERMINED log10 IU/mL

## 2018-03-23 LAB — BK QUANT PCR (PLASMA/SERUM)
BK Quantitaion PCR: NEGATIVE copies/mL
Log10 BK Qn PCR: UNDETERMINED log10copy/mL

## 2018-03-23 LAB — EPSTEIN BARR VRS(EBV DNA BY PCR)
EBV DNA QN BY PCR: 218 {copies}/mL
log10 EBV DNA Qn PCR: 2.338 log10 copy/mL

## 2018-04-04 ENCOUNTER — Ambulatory Visit (HOSPITAL_COMMUNITY)
Admission: RE | Admit: 2018-04-04 | Discharge: 2018-04-04 | Disposition: A | Discharge status: Home / Self Care | Payer: Medicare HMO | Source: Ambulatory Visit | Attending: Nephrology | Admitting: Nephrology

## 2018-04-04 VITALS — BP 135/87 | HR 78 | Temp 98.5°F | Resp 20

## 2018-04-04 DIAGNOSIS — T451X5A Adverse effect of antineoplastic and immunosuppressive drugs, initial encounter: Secondary | ICD-10-CM | POA: Diagnosis present

## 2018-04-04 DIAGNOSIS — N184 Chronic kidney disease, stage 4 (severe): Secondary | ICD-10-CM | POA: Diagnosis present

## 2018-04-04 LAB — RENAL FUNCTION PANEL
ALBUMIN: 3.7 g/dL (ref 3.5–5.0)
Anion gap: 14 (ref 5–15)
BUN: 64 mg/dL — ABNORMAL HIGH (ref 6–20)
CO2: 19 mmol/L — ABNORMAL LOW (ref 22–32)
Calcium: 8.4 mg/dL — ABNORMAL LOW (ref 8.9–10.3)
Chloride: 110 mmol/L (ref 98–111)
Creatinine, Ser: 6.3 mg/dL — ABNORMAL HIGH (ref 0.44–1.00)
GFR calc Af Amer: 8 mL/min — ABNORMAL LOW (ref >60)
GFR calc non Af Amer: 7 mL/min — ABNORMAL LOW (ref >60)
Glucose, Bld: 95 mg/dL (ref 70–99)
Phosphorus: 4.6 mg/dL (ref 2.5–4.6)
Potassium: 5 mmol/L (ref 3.5–5.1)
Sodium: 143 mmol/L (ref 135–145)

## 2018-04-04 LAB — IRON AND TIBC
Iron: 139 ug/dL (ref 28–170)
Saturation Ratios: 59 % — ABNORMAL HIGH (ref 10.4–31.8)
TIBC: 237 ug/dL — ABNORMAL LOW (ref 250–450)
UIBC: 98 ug/dL

## 2018-04-04 LAB — POCT HEMOGLOBIN-HEMACUE: Hemoglobin: 9.4 g/dL — ABNORMAL LOW (ref 12.0–15.0)

## 2018-04-04 LAB — FERRITIN: FERRITIN: 687 ng/mL — AB (ref 11–307)

## 2018-04-04 MED ORDER — EPOETIN ALFA-EPBX 10000 UNIT/ML IJ SOLN
20000.0000 [IU] | INTRAMUSCULAR | Status: DC
  Administered 2018-04-04: 20000 [IU] via SUBCUTANEOUS
  Filled 2018-04-04: qty 2

## 2018-04-06 LAB — TACROLIMUS LEVEL: Tacrolimus (FK506) - LabCorp: 7.4 ng/mL (ref 2.0–20.0)

## 2018-04-18 ENCOUNTER — Ambulatory Visit (HOSPITAL_COMMUNITY)
Admission: RE | Admit: 2018-04-18 | Discharge: 2018-04-18 | Disposition: A | Discharge status: Home / Self Care | Payer: Medicare HMO | Source: Ambulatory Visit | Attending: Nephrology | Admitting: Nephrology

## 2018-04-18 VITALS — BP 117/96 | HR 72 | Temp 98.9°F | Resp 20

## 2018-04-18 DIAGNOSIS — N184 Chronic kidney disease, stage 4 (severe): Secondary | ICD-10-CM | POA: Diagnosis present

## 2018-04-18 DIAGNOSIS — T451X5A Adverse effect of antineoplastic and immunosuppressive drugs, initial encounter: Secondary | ICD-10-CM | POA: Insufficient documentation

## 2018-04-18 LAB — POCT HEMOGLOBIN-HEMACUE: Hemoglobin: 9.1 g/dL — ABNORMAL LOW (ref 12.0–15.0)

## 2018-04-18 MED ORDER — EPOETIN ALFA-EPBX 10000 UNIT/ML IJ SOLN
20000.0000 [IU] | INTRAMUSCULAR | Status: DC
  Administered 2018-04-18: 20000 [IU] via SUBCUTANEOUS
  Filled 2018-04-18: qty 2

## 2018-05-02 ENCOUNTER — Encounter (HOSPITAL_COMMUNITY): Payer: Medicare HMO

## 2018-05-09 ENCOUNTER — Ambulatory Visit (HOSPITAL_COMMUNITY)
Admission: RE | Admit: 2018-05-09 | Discharge: 2018-05-09 | Disposition: A | Discharge status: Home / Self Care | Payer: Medicare HMO | Source: Ambulatory Visit | Attending: Nephrology | Admitting: Nephrology

## 2018-05-09 ENCOUNTER — Other Ambulatory Visit: Payer: Self-pay

## 2018-05-09 ENCOUNTER — Encounter (HOSPITAL_COMMUNITY): Payer: Medicare HMO

## 2018-05-09 VITALS — BP 138/78 | HR 78 | Temp 98.3°F | Resp 20

## 2018-05-09 DIAGNOSIS — T451X5A Adverse effect of antineoplastic and immunosuppressive drugs, initial encounter: Secondary | ICD-10-CM | POA: Insufficient documentation

## 2018-05-09 DIAGNOSIS — N184 Chronic kidney disease, stage 4 (severe): Secondary | ICD-10-CM | POA: Diagnosis present

## 2018-05-09 LAB — RENAL FUNCTION PANEL
Albumin: 3.7 g/dL (ref 3.5–5.0)
Anion gap: 8 (ref 5–15)
BUN: 75 mg/dL — ABNORMAL HIGH (ref 6–20)
CO2: 19 mmol/L — AB (ref 22–32)
Calcium: 8.3 mg/dL — ABNORMAL LOW (ref 8.9–10.3)
Chloride: 116 mmol/L — ABNORMAL HIGH (ref 98–111)
Creatinine, Ser: 6.59 mg/dL — ABNORMAL HIGH (ref 0.44–1.00)
GFR calc Af Amer: 7 mL/min — ABNORMAL LOW (ref >60)
GFR calc non Af Amer: 6 mL/min — ABNORMAL LOW (ref >60)
GLUCOSE: 109 mg/dL — AB (ref 70–99)
Phosphorus: 6.4 mg/dL — ABNORMAL HIGH (ref 2.5–4.6)
Potassium: 5 mmol/L (ref 3.5–5.1)
Sodium: 143 mmol/L (ref 135–145)

## 2018-05-09 LAB — POCT HEMOGLOBIN-HEMACUE: Hemoglobin: 9.1 g/dL — ABNORMAL LOW (ref 12.0–15.0)

## 2018-05-09 LAB — IRON AND TIBC
IRON: 193 ug/dL — AB (ref 28–170)
Saturation Ratios: 87 % — ABNORMAL HIGH (ref 10.4–31.8)
TIBC: 223 ug/dL — AB (ref 250–450)
UIBC: 30 ug/dL

## 2018-05-09 LAB — FERRITIN: Ferritin: 713 ng/mL — ABNORMAL HIGH (ref 11–307)

## 2018-05-09 MED ORDER — EPOETIN ALFA-EPBX 10000 UNIT/ML IJ SOLN
20000.0000 [IU] | INTRAMUSCULAR | Status: DC
  Administered 2018-05-09: 20000 [IU] via SUBCUTANEOUS
  Filled 2018-05-09: qty 2

## 2018-05-11 LAB — TACROLIMUS LEVEL: Tacrolimus (FK506) - LabCorp: 3.3 ng/mL (ref 2.0–20.0)

## 2018-05-16 ENCOUNTER — Encounter (HOSPITAL_COMMUNITY): Payer: Medicare HMO

## 2018-05-23 ENCOUNTER — Inpatient Hospital Stay (HOSPITAL_COMMUNITY): Admission: RE | Admit: 2018-05-23 | Payer: Medicare HMO | Source: Ambulatory Visit

## 2018-06-04 ENCOUNTER — Ambulatory Visit (HOSPITAL_COMMUNITY)
Admission: RE | Admit: 2018-06-04 | Discharge: 2018-06-04 | Disposition: A | Discharge status: Home / Self Care | Payer: Medicare HMO | Source: Ambulatory Visit | Attending: Nephrology | Admitting: Nephrology

## 2018-06-04 ENCOUNTER — Other Ambulatory Visit: Payer: Self-pay

## 2018-06-04 VITALS — BP 177/95 | HR 98 | Temp 97.6°F | Resp 20

## 2018-06-04 DIAGNOSIS — T451X5A Adverse effect of antineoplastic and immunosuppressive drugs, initial encounter: Secondary | ICD-10-CM | POA: Insufficient documentation

## 2018-06-04 DIAGNOSIS — N184 Chronic kidney disease, stage 4 (severe): Secondary | ICD-10-CM | POA: Insufficient documentation

## 2018-06-04 LAB — RENAL FUNCTION PANEL
Albumin: 3.7 g/dL (ref 3.5–5.0)
Anion gap: 11 (ref 5–15)
BUN: 71 mg/dL — ABNORMAL HIGH (ref 6–20)
CO2: 19 mmol/L — ABNORMAL LOW (ref 22–32)
Calcium: 8.7 mg/dL — ABNORMAL LOW (ref 8.9–10.3)
Chloride: 111 mmol/L (ref 98–111)
Creatinine, Ser: 6.73 mg/dL — ABNORMAL HIGH (ref 0.44–1.00)
GFR calc Af Amer: 7 mL/min — ABNORMAL LOW (ref >60)
GFR calc non Af Amer: 6 mL/min — ABNORMAL LOW (ref >60)
Glucose, Bld: 94 mg/dL (ref 70–99)
Phosphorus: 6.5 mg/dL — ABNORMAL HIGH (ref 2.5–4.6)
Potassium: 5.1 mmol/L (ref 3.5–5.1)
Sodium: 141 mmol/L (ref 135–145)

## 2018-06-04 LAB — POCT HEMOGLOBIN-HEMACUE: Hemoglobin: 8.8 g/dL — ABNORMAL LOW (ref 12.0–15.0)

## 2018-06-04 LAB — FERRITIN: Ferritin: 727 ng/mL — ABNORMAL HIGH (ref 11–307)

## 2018-06-04 LAB — IRON AND TIBC
Iron: 183 ug/dL — ABNORMAL HIGH (ref 28–170)
Saturation Ratios: 79 % — ABNORMAL HIGH (ref 10.4–31.8)
TIBC: 231 ug/dL — ABNORMAL LOW (ref 250–450)
UIBC: 48 ug/dL

## 2018-06-04 MED ORDER — EPOETIN ALFA-EPBX 10000 UNIT/ML IJ SOLN: 20000.0000 [IU] | Freq: Once | INTRAMUSCULAR | Status: DC

## 2018-06-04 MED ORDER — EPOETIN ALFA-EPBX 10000 UNIT/ML IJ SOLN
20000.0000 [IU] | INTRAMUSCULAR | Status: DC
  Administered 2018-06-04: 20000 [IU] via SUBCUTANEOUS
  Filled 2018-06-04: qty 2

## 2018-06-05 LAB — PTH, INTACT AND CALCIUM
Calcium, Total (PTH): 8.9 mg/dL (ref 8.7–10.2)
PTH: 1501 pg/mL — ABNORMAL HIGH (ref 15–65)

## 2018-06-06 ENCOUNTER — Encounter (HOSPITAL_COMMUNITY): Payer: Medicare HMO

## 2018-06-18 ENCOUNTER — Other Ambulatory Visit: Payer: Self-pay

## 2018-06-18 ENCOUNTER — Ambulatory Visit (HOSPITAL_COMMUNITY)
Admission: RE | Admit: 2018-06-18 | Discharge: 2018-06-18 | Disposition: A | Discharge status: Home / Self Care | Payer: Medicare HMO | Source: Ambulatory Visit | Attending: Nephrology | Admitting: Nephrology

## 2018-06-18 VITALS — BP 156/81 | HR 88 | Temp 97.7°F | Resp 18

## 2018-06-18 DIAGNOSIS — N184 Chronic kidney disease, stage 4 (severe): Secondary | ICD-10-CM | POA: Insufficient documentation

## 2018-06-18 DIAGNOSIS — T451X5A Adverse effect of antineoplastic and immunosuppressive drugs, initial encounter: Secondary | ICD-10-CM | POA: Insufficient documentation

## 2018-06-18 LAB — POCT HEMOGLOBIN-HEMACUE: Hemoglobin: 8.5 g/dL — ABNORMAL LOW (ref 12.0–15.0)

## 2018-06-18 MED ORDER — EPOETIN ALFA-EPBX 10000 UNIT/ML IJ SOLN
20000.0000 [IU] | INTRAMUSCULAR | Status: DC
  Administered 2018-06-18: 20000 [IU] via SUBCUTANEOUS
  Filled 2018-06-18: qty 2

## 2018-06-19 LAB — TACROLIMUS LEVEL: Tacrolimus (FK506) - LabCorp: 2.1 ng/mL (ref 2.0–20.0)

## 2018-06-20 LAB — EPSTEIN BARR VRS(EBV DNA BY PCR)
EBV DNA QN by PCR: 191 copies/mL
log10 EBV DNA Qn PCR: 2.281 log10 copy/mL

## 2018-06-20 LAB — CMV DNA, QUANTITATIVE, PCR
CMV DNA Quant: NEGATIVE IU/mL
Log10 CMV Qn DNA Pl: UNDETERMINED log10 IU/mL

## 2018-06-20 LAB — BK QUANT PCR (PLASMA/SERUM)
BK Quantitaion PCR: NEGATIVE copies/mL
Log10 BK Qn PCR: UNDETERMINED log10copy/mL

## 2018-07-02 ENCOUNTER — Encounter (HOSPITAL_COMMUNITY): Payer: Medicare HMO

## 2018-07-09 ENCOUNTER — Other Ambulatory Visit: Payer: Self-pay

## 2018-07-09 ENCOUNTER — Encounter (HOSPITAL_COMMUNITY)
Admission: RE | Admit: 2018-07-09 | Discharge: 2018-07-09 | Disposition: A | Discharge status: Home / Self Care | Payer: Medicare HMO | Source: Ambulatory Visit | Attending: Nephrology | Admitting: Nephrology

## 2018-07-09 VITALS — BP 156/109 | HR 85 | Temp 97.6°F | Resp 18

## 2018-07-09 DIAGNOSIS — T451X5A Adverse effect of antineoplastic and immunosuppressive drugs, initial encounter: Secondary | ICD-10-CM | POA: Diagnosis not present

## 2018-07-09 DIAGNOSIS — N184 Chronic kidney disease, stage 4 (severe): Secondary | ICD-10-CM | POA: Insufficient documentation

## 2018-07-09 LAB — IRON AND TIBC
Iron: 197 ug/dL — ABNORMAL HIGH (ref 28–170)
Saturation Ratios: 83 % — ABNORMAL HIGH (ref 10.4–31.8)
TIBC: 238 ug/dL — ABNORMAL LOW (ref 250–450)
UIBC: 41 ug/dL

## 2018-07-09 LAB — RENAL FUNCTION PANEL
Albumin: 3.8 g/dL (ref 3.5–5.0)
Anion gap: 16 — ABNORMAL HIGH (ref 5–15)
BUN: 70 mg/dL — ABNORMAL HIGH (ref 6–20)
CO2: 17 mmol/L — ABNORMAL LOW (ref 22–32)
Calcium: 8.6 mg/dL — ABNORMAL LOW (ref 8.9–10.3)
Chloride: 110 mmol/L (ref 98–111)
Creatinine, Ser: 7.34 mg/dL — ABNORMAL HIGH (ref 0.44–1.00)
GFR calc Af Amer: 6 mL/min — ABNORMAL LOW (ref >60)
GFR calc non Af Amer: 6 mL/min — ABNORMAL LOW (ref >60)
Glucose, Bld: 82 mg/dL (ref 70–99)
Phosphorus: 7.4 mg/dL — ABNORMAL HIGH (ref 2.5–4.6)
Potassium: 4.4 mmol/L (ref 3.5–5.1)
Sodium: 143 mmol/L (ref 135–145)

## 2018-07-09 LAB — FERRITIN: Ferritin: 697 ng/mL — ABNORMAL HIGH (ref 11–307)

## 2018-07-09 LAB — POCT HEMOGLOBIN-HEMACUE: Hemoglobin: 8.4 g/dL — ABNORMAL LOW (ref 12.0–15.0)

## 2018-07-09 MED ORDER — EPOETIN ALFA-EPBX 10000 UNIT/ML IJ SOLN
20000.0000 [IU] | INTRAMUSCULAR | Status: DC
  Administered 2018-07-09: 14:00:00 20000 [IU] via SUBCUTANEOUS
  Filled 2018-07-09: qty 2

## 2018-07-11 ENCOUNTER — Other Ambulatory Visit: Payer: Self-pay

## 2018-07-12 ENCOUNTER — Ambulatory Visit (INDEPENDENT_AMBULATORY_CARE_PROVIDER_SITE_OTHER): Payer: Medicare HMO | Admitting: Obstetrics & Gynecology

## 2018-07-12 ENCOUNTER — Encounter: Payer: Self-pay | Admitting: Obstetrics & Gynecology

## 2018-07-12 VITALS — BP 134/86 | Ht 62.0 in | Wt 172.0 lb

## 2018-07-12 DIAGNOSIS — Z780 Asymptomatic menopausal state: Secondary | ICD-10-CM

## 2018-07-12 DIAGNOSIS — T8611 Kidney transplant rejection: Secondary | ICD-10-CM

## 2018-07-12 DIAGNOSIS — N184 Chronic kidney disease, stage 4 (severe): Secondary | ICD-10-CM | POA: Diagnosis not present

## 2018-07-12 DIAGNOSIS — Z6831 Body mass index (BMI) 31.0-31.9, adult: Secondary | ICD-10-CM

## 2018-07-12 DIAGNOSIS — E6609 Other obesity due to excess calories: Secondary | ICD-10-CM | POA: Diagnosis not present

## 2018-07-12 DIAGNOSIS — Z13820 Encounter for screening for osteoporosis: Secondary | ICD-10-CM | POA: Diagnosis not present

## 2018-07-12 DIAGNOSIS — Z01419 Encounter for gynecological examination (general) (routine) without abnormal findings: Secondary | ICD-10-CM | POA: Diagnosis not present

## 2018-07-12 NOTE — Progress Notes (Signed)
[name] January 16, 1961 [id number]   History:    58 y.o. G3P1A2L1  Divorced.  Stable boyfriend x 2008.  Son is 87 yo, has 2 children.  RP:  New patient presenting for annual gyn exam     HPI:  Menopause, well on no HRT.  No PMB.  No pelvic pain.  Abstinent x 3 years.  Breasts normal.  BMI 31.46.  Mild physical activity.  Kidney transplant 2005, rejected.  Not on dialysis at this time, but will probably need a transplant again.  Active on the transplant list at [facility].  Dr Posey Pronto Nephrologist following her.  Colonoscopy 2017.  Past medical history,surgical history, family history and social history were all reviewed and documented in the EPIC chart.  Gynecologic History No LMP recorded. Patient is postmenopausal. Contraception: abstinence and post menopausal status Last Pap: 2018. Results were: normal per patient. Last mammogram: 09/2017. Results were: normal per patient, will obtain report. Bone Density: No recent BD, will schedule here now. Colonoscopy: 2017  Obstetric History OB History  Gravida Para Term Preterm AB Living  3 1     2 1   SAB TAB Ectopic Multiple Live Births               # Outcome Date GA Lbr Len/2nd Weight Sex Delivery Anes PTL Lv  3 AB           2 AB           1 Para              ROS: A ROS was performed and pertinent positives and negatives are included in the history.  GENERAL: No fevers or chills. HEENT: No change in vision, no earache, sore throat or sinus congestion. NECK: No pain or stiffness. CARDIOVASCULAR: No chest pain or pressure. No palpitations. PULMONARY: No shortness of breath, cough or wheeze. GASTROINTESTINAL: No abdominal pain, nausea, vomiting or diarrhea, melena or bright red blood per rectum. GENITOURINARY: No urinary frequency, urgency, hesitancy or dysuria. MUSCULOSKELETAL: No joint or muscle pain, no back pain, no recent trauma. DERMATOLOGIC: No rash, no itching, no lesions. ENDOCRINE: No polyuria, polydipsia, no heat or cold intolerance.  No recent change in weight. HEMATOLOGICAL: No anemia or easy bruising or bleeding. NEUROLOGIC: No headache, seizures, numbness, tingling or weakness. PSYCHIATRIC: No depression, no loss of interest in normal activity or change in sleep pattern.     Exam:   BP 134/86   Ht 5\' 2"  (1.575 m)   Wt 172 lb (78 kg)   BMI 31.46 kg/m   Body mass index is 31.46 kg/m.  General appearance : Well developed well nourished female. No acute distress HEENT: Eyes: no retinal hemorrhage or exudates,  Neck supple, trachea midline, no carotid bruits, no thyroidmegaly Lungs: Clear to auscultation, no rhonchi or wheezes, or rib retractions  Heart: Regular rate and rhythm, no murmurs or gallops Breast:Examined in sitting and supine position were symmetrical in appearance, no palpable masses or tenderness,  no skin retraction, no nipple inversion, no nipple discharge, no skin discoloration, no axillary or supraclavicular lymphadenopathy Abdomen: no palpable masses or tenderness, no rebound or guarding Extremities: no edema or skin discoloration or tenderness  Pelvic: Vulva: Normal             Vagina: No gross lesions or discharge  Cervix: No gross lesions or discharge.  Pap reflex done.  Uterus  AV, normal size, shape and consistency, non-tender and mobile  Adnexa  Without masses or tenderness  Anus:  Normal   Assessment/Plan:  58 y.o. female for annual exam   1. Encounter for routine gynecological examination with Papanicolaou smear of cervix Normal gynecologic exam in menopause.  Pap reflex done today.  Breast exam normal.  Screening mammogram August 2019 was normal per patient, will obtain results.  Colonoscopy in 2017.  Health labs with Dr. Posey Pronto.    2. Postmenopause Well on no hormone replacement therapy.  No postmenopausal bleeding.  3. Screening for osteoporosis Will schedule a bone density here now.  Vitamin D supplements, calcium intake of 1200 to 1500 mg daily and regular weightbearing physical  activity is recommended. - DG Bone Density; Future  4. Kidney transplant failure and rejection Active on the [facility] waiting list for kidney transplant.  Followed by Dr. Posey Pronto nephrologist.  5. Chronic kidney disease, stage IV (severe) (HCC)31 As above.   Followed by Dr. Posey Pronto nephrologist.  6. Class 1 obesity due to excess calories with serious comorbidity and body mass index (BMI) of 31.0 to 31.9 in adult Probably a component of water retention associated with stage IV chronic kidney disease, but at least overweight if not obese.  Recommend a healthy lower calorie diet and regular physical activities as capable of.  Princess Bruins MD, 2:29 PM 07/12/2018

## 2018-07-13 ENCOUNTER — Encounter: Payer: Self-pay | Admitting: Obstetrics & Gynecology

## 2018-07-13 LAB — PAP IG W/ RFLX HPV ASCU

## 2018-07-13 NOTE — Patient Instructions (Addendum)
1. Encounter for routine gynecological examination with Papanicolaou smear of cervix Normal gynecologic exam in menopause.  Pap reflex done today.  Breast exam normal.  Screening mammogram August 2019 was normal per patient, will obtain results.  Colonoscopy in 2017.  Health labs with Dr. Posey Pronto.    2. Postmenopause Well on no hormone replacement therapy.  No postmenopausal bleeding.  3. Screening for osteoporosis Will schedule a bone density here now.  Vitamin D supplements, calcium intake of 1200 to 1500 mg daily and regular weightbearing physical activity is recommended. - DG Bone Density; Future  4. Kidney transplant failure and rejection Active on the [facility] waiting list for kidney transplant.  Followed by Dr. Posey Pronto nephrologist.  5. Chronic kidney disease, stage IV (severe) (HCC)31 As above.   Followed by Dr. Posey Pronto nephrologist.  6. Class 1 obesity due to excess calories with serious comorbidity and body mass index (BMI) of 31.0 to 31.9 in adult Probably a component of water retention associated with stage IV chronic kidney disease, but at least overweight if not obese.  Recommend a healthy lower calorie diet and regular physical activities as capable of.  Natallia, it was a pleasure meeting you today!  I will inform you of your results as soon as they are available.

## 2018-07-16 ENCOUNTER — Encounter (HOSPITAL_COMMUNITY): Payer: Medicare HMO

## 2018-07-16 ENCOUNTER — Encounter (HOSPITAL_COMMUNITY): Payer: Self-pay

## 2018-07-24 ENCOUNTER — Ambulatory Visit (HOSPITAL_COMMUNITY)
Admission: RE | Admit: 2018-07-24 | Discharge: 2018-07-24 | Disposition: A | Discharge status: Home / Self Care | Payer: Medicare HMO | Source: Ambulatory Visit | Attending: Nephrology | Admitting: Nephrology

## 2018-07-24 ENCOUNTER — Other Ambulatory Visit: Payer: Self-pay

## 2018-07-24 VITALS — BP 143/67 | HR 83 | Temp 97.7°F | Resp 18

## 2018-07-24 DIAGNOSIS — N184 Chronic kidney disease, stage 4 (severe): Secondary | ICD-10-CM | POA: Insufficient documentation

## 2018-07-24 DIAGNOSIS — T451X5A Adverse effect of antineoplastic and immunosuppressive drugs, initial encounter: Secondary | ICD-10-CM | POA: Diagnosis not present

## 2018-07-24 LAB — POCT HEMOGLOBIN-HEMACUE: Hemoglobin: 8 g/dL — ABNORMAL LOW (ref 12.0–15.0)

## 2018-07-24 MED ORDER — EPOETIN ALFA-EPBX 10000 UNIT/ML IJ SOLN
20000.0000 [IU] | INTRAMUSCULAR | Status: DC
  Administered 2018-07-24: 20000 [IU] via SUBCUTANEOUS
  Filled 2018-07-24: qty 2

## 2018-07-25 LAB — TACROLIMUS LEVEL: Tacrolimus (FK506) - LabCorp: 5 ng/mL (ref 2.0–20.0)

## 2018-08-07 ENCOUNTER — Ambulatory Visit (HOSPITAL_COMMUNITY)
Admission: RE | Admit: 2018-08-07 | Discharge: 2018-08-07 | Disposition: A | Discharge status: Home / Self Care | Payer: Medicare HMO | Source: Ambulatory Visit | Attending: Nephrology | Admitting: Nephrology

## 2018-08-07 ENCOUNTER — Other Ambulatory Visit: Payer: Self-pay

## 2018-08-07 VITALS — BP 112/83 | HR 80 | Temp 97.6°F | Resp 18

## 2018-08-07 DIAGNOSIS — T451X5A Adverse effect of antineoplastic and immunosuppressive drugs, initial encounter: Secondary | ICD-10-CM | POA: Diagnosis present

## 2018-08-07 DIAGNOSIS — N184 Chronic kidney disease, stage 4 (severe): Secondary | ICD-10-CM | POA: Diagnosis not present

## 2018-08-07 LAB — RENAL FUNCTION PANEL
Albumin: 3.6 g/dL (ref 3.5–5.0)
Anion gap: 14 (ref 5–15)
BUN: 59 mg/dL — ABNORMAL HIGH (ref 6–20)
CO2: 20 mmol/L — ABNORMAL LOW (ref 22–32)
Calcium: 8.2 mg/dL — ABNORMAL LOW (ref 8.9–10.3)
Chloride: 109 mmol/L (ref 98–111)
Creatinine, Ser: 7.8 mg/dL — ABNORMAL HIGH (ref 0.44–1.00)
GFR calc Af Amer: 6 mL/min — ABNORMAL LOW (ref >60)
GFR calc non Af Amer: 5 mL/min — ABNORMAL LOW (ref >60)
Glucose, Bld: 98 mg/dL (ref 70–99)
Phosphorus: 7 mg/dL — ABNORMAL HIGH (ref 2.5–4.6)
Potassium: 4.7 mmol/L (ref 3.5–5.1)
Sodium: 143 mmol/L (ref 135–145)

## 2018-08-07 LAB — FERRITIN: Ferritin: 753 ng/mL — ABNORMAL HIGH (ref 11–307)

## 2018-08-07 LAB — IRON AND TIBC
Iron: 107 ug/dL (ref 28–170)
Saturation Ratios: 47 % — ABNORMAL HIGH (ref 10.4–31.8)
TIBC: 228 ug/dL — ABNORMAL LOW (ref 250–450)
UIBC: 121 ug/dL

## 2018-08-07 LAB — POCT HEMOGLOBIN-HEMACUE: Hemoglobin: 7.8 g/dL — ABNORMAL LOW (ref 12.0–15.0)

## 2018-08-07 MED ORDER — EPOETIN ALFA-EPBX 40000 UNIT/ML IJ SOLN: 40000.0000 [IU] | Freq: Once | INTRAMUSCULAR | Status: DC

## 2018-08-07 MED ORDER — EPOETIN ALFA-EPBX 10000 UNIT/ML IJ SOLN
40000.0000 [IU] | Freq: Once | INTRAMUSCULAR | Status: AC
  Administered 2018-08-07: 40000 [IU] via SUBCUTANEOUS
  Filled 2018-08-07: qty 4

## 2018-08-07 MED ORDER — EPOETIN ALFA-EPBX 10000 UNIT/ML IJ SOLN
20000.0000 [IU] | INTRAMUSCULAR | Status: DC
  Filled 2018-08-07: qty 2

## 2018-08-20 ENCOUNTER — Other Ambulatory Visit (HOSPITAL_COMMUNITY): Payer: Self-pay | Admitting: *Deleted

## 2018-08-21 ENCOUNTER — Encounter (HOSPITAL_COMMUNITY): Payer: Medicare HMO

## 2018-08-22 ENCOUNTER — Ambulatory Visit: Payer: Medicare HMO | Admitting: Obstetrics & Gynecology

## 2018-08-23 ENCOUNTER — Telehealth: Payer: Self-pay

## 2018-08-23 NOTE — Telephone Encounter (Signed)
Patient called because you recommended a colposcopy based on Pap smear result recently. She scheduled colpo but then cancelled.  She said she is concerned because she has kidney failure and wondered if something "less invasive" could be done.  I called her back and explained colposcopy again to her and that it is not invasive and that Dr. Dellis Filbert will insert a speculum in her vagina and look at her cervix through a microscope. I did mention the possibility of a biopsy that would be a small piece of tissue removed from the cervix.  She said she is fine with this as long as Dr. 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  Appt scheduled.

## 2018-09-04 ENCOUNTER — Encounter (HOSPITAL_COMMUNITY): Payer: Medicare HMO

## 2018-09-14 ENCOUNTER — Inpatient Hospital Stay (HOSPITAL_COMMUNITY)
Admission: RE | Admit: 2018-09-14 | Discharge: 2018-09-14 | Disposition: A | Discharge status: Home / Self Care | Payer: Medicare HMO | Source: Ambulatory Visit | Attending: Nephrology | Admitting: Nephrology

## 2018-09-20 ENCOUNTER — Ambulatory Visit (HOSPITAL_COMMUNITY)
Admission: RE | Admit: 2018-09-20 | Discharge: 2018-09-20 | Disposition: A | Discharge status: Home / Self Care | Payer: Medicare HMO | Source: Ambulatory Visit | Attending: Nephrology | Admitting: Nephrology

## 2018-09-20 ENCOUNTER — Other Ambulatory Visit: Payer: Self-pay

## 2018-09-20 ENCOUNTER — Telehealth: Payer: Self-pay

## 2018-09-20 VITALS — BP 129/71 | HR 81 | Temp 97.2°F | Resp 18

## 2018-09-20 DIAGNOSIS — T451X5A Adverse effect of antineoplastic and immunosuppressive drugs, initial encounter: Secondary | ICD-10-CM | POA: Diagnosis present

## 2018-09-20 DIAGNOSIS — N184 Chronic kidney disease, stage 4 (severe): Secondary | ICD-10-CM | POA: Insufficient documentation

## 2018-09-20 LAB — URINALYSIS, COMPLETE (UACMP) WITH MICROSCOPIC
Bilirubin Urine: NEGATIVE
Glucose, UA: 50 mg/dL — AB
Hgb urine dipstick: NEGATIVE
Ketones, ur: NEGATIVE mg/dL
Nitrite: NEGATIVE
Protein, ur: >=300 mg/dL — AB
Specific Gravity, Urine: 1.014 (ref 1.005–1.030)
pH: 7 (ref 5.0–8.0)

## 2018-09-20 LAB — CBC WITH DIFFERENTIAL/PLATELET
Abs Immature Granulocytes: 0.02 10*3/uL (ref 0.00–0.07)
Basophils Absolute: 0 10*3/uL (ref 0.0–0.1)
Basophils Relative: 1 %
Eosinophils Absolute: 0 10*3/uL (ref 0.0–0.5)
Eosinophils Relative: 1 %
HCT: 21.6 % — ABNORMAL LOW (ref 36.0–46.0)
Hemoglobin: 6.6 g/dL — CL (ref 12.0–15.0)
Immature Granulocytes: 1 %
Lymphocytes Relative: 20 %
Lymphs Abs: 0.7 10*3/uL (ref 0.7–4.0)
MCH: 25.1 pg — ABNORMAL LOW (ref 26.0–34.0)
MCHC: 30.6 g/dL (ref 30.0–36.0)
MCV: 82.1 fL (ref 80.0–100.0)
Monocytes Absolute: 0.2 10*3/uL (ref 0.1–1.0)
Monocytes Relative: 6 %
Neutro Abs: 2.6 10*3/uL (ref 1.7–7.7)
Neutrophils Relative %: 71 %
Platelets: 176 10*3/uL (ref 150–400)
RBC: 2.63 MIL/uL — ABNORMAL LOW (ref 3.87–5.11)
RDW: 14.6 % (ref 11.5–15.5)
WBC: 3.6 10*3/uL — ABNORMAL LOW (ref 4.0–10.5)
nRBC: 0 % (ref 0.0–0.2)

## 2018-09-20 LAB — RENAL FUNCTION PANEL
Albumin: 3.6 g/dL (ref 3.5–5.0)
Anion gap: 12 (ref 5–15)
BUN: 61 mg/dL — ABNORMAL HIGH (ref 6–20)
CO2: 19 mmol/L — ABNORMAL LOW (ref 22–32)
Calcium: 8.6 mg/dL — ABNORMAL LOW (ref 8.9–10.3)
Chloride: 112 mmol/L — ABNORMAL HIGH (ref 98–111)
Creatinine, Ser: 7.89 mg/dL — ABNORMAL HIGH (ref 0.44–1.00)
GFR calc Af Amer: 6 mL/min — ABNORMAL LOW (ref >60)
GFR calc non Af Amer: 5 mL/min — ABNORMAL LOW (ref >60)
Glucose, Bld: 112 mg/dL — ABNORMAL HIGH (ref 70–99)
Phosphorus: 5.3 mg/dL — ABNORMAL HIGH (ref 2.5–4.6)
Potassium: 5 mmol/L (ref 3.5–5.1)
Sodium: 143 mmol/L (ref 135–145)

## 2018-09-20 LAB — MAGNESIUM: Magnesium: 2.2 mg/dL (ref 1.7–2.4)

## 2018-09-20 LAB — IRON AND TIBC
Iron: 139 ug/dL (ref 28–170)
Saturation Ratios: 62 % — ABNORMAL HIGH (ref 10.4–31.8)
TIBC: 223 ug/dL — ABNORMAL LOW (ref 250–450)
UIBC: 84 ug/dL

## 2018-09-20 LAB — FERRITIN: Ferritin: 795 ng/mL — ABNORMAL HIGH (ref 11–307)

## 2018-09-20 LAB — POCT HEMOGLOBIN-HEMACUE: Hemoglobin: 6.6 g/dL — CL (ref 12.0–15.0)

## 2018-09-20 MED ORDER — EPOETIN ALFA-EPBX 10000 UNIT/ML IJ SOLN
40000.0000 [IU] | INTRAMUSCULAR | Status: DC
  Administered 2018-09-20: 40000 [IU] via SUBCUTANEOUS
  Filled 2018-09-20: qty 4

## 2018-09-20 NOTE — Telephone Encounter (Signed)
Scheduled for colposcopy tomorrow.  She has kidney failure and was seeing those doctors. Her hgb is down to 6.6.  She said they were going to send her to ER but because of Covid being handled differently and she will go back next week. She required a wheel chair yesterday.  She was calling asking me if she should cancel her colposcopy for tomorrow.  She mentioned bleeding from it and even though she assumes minimal was concerned about it.

## 2018-09-20 NOTE — Progress Notes (Signed)
Hemocue 6.6 today. Called Dr. Serita Grit office and informed Amber. Pt had visit with Dr. Posey Pronto prior to this appointment. No acute distress noted. Pt denied chest pain, shortness of breath, dizziness. Per Dr. Posey Pronto, proceed with Retacrit 40,000 but frequency to be changed to every week. Pt verbalized understanding and appointment changed. Pt also verbalized understanding to go to ED with worsening symptoms.

## 2018-09-21 ENCOUNTER — Ambulatory Visit: Payer: Medicare HMO | Admitting: Obstetrics & Gynecology

## 2018-09-21 LAB — PTH, INTACT AND CALCIUM
Calcium, Total (PTH): 8.5 mg/dL — ABNORMAL LOW (ref 8.7–10.2)
PTH: 751 pg/mL — ABNORMAL HIGH (ref 15–65)

## 2018-09-21 LAB — VITAMIN D 25 HYDROXY (VIT D DEFICIENCY, FRACTURES): Vit D, 25-Hydroxy: 26.7 ng/mL — ABNORMAL LOW (ref 30.0–100.0)

## 2018-09-21 NOTE — Telephone Encounter (Signed)
Patient rescheduled colpo to 11/01/18 on advice of PCP.

## 2018-09-22 LAB — CMV DNA, QUANTITATIVE, PCR
CMV DNA Quant: POSITIVE IU/mL
Log10 CMV Qn DNA Pl: UNDETERMINED log10 IU/mL

## 2018-09-23 LAB — TACROLIMUS LEVEL: Tacrolimus (FK506) - LabCorp: 2.9 ng/mL (ref 2.0–20.0)

## 2018-09-24 LAB — EPSTEIN BARR VRS(EBV DNA BY PCR)
EBV DNA QN by PCR: 527 copies/mL
log10 EBV DNA Qn PCR: 2.722 log10 copy/mL

## 2018-09-28 ENCOUNTER — Other Ambulatory Visit: Payer: Self-pay

## 2018-09-28 ENCOUNTER — Encounter (HOSPITAL_COMMUNITY)
Admission: RE | Admit: 2018-09-28 | Discharge: 2018-09-28 | Disposition: A | Discharge status: Home / Self Care | Payer: Medicare HMO | Source: Ambulatory Visit | Attending: Nephrology | Admitting: Nephrology

## 2018-09-28 ENCOUNTER — Encounter (HOSPITAL_COMMUNITY): Payer: Medicare HMO

## 2018-09-28 VITALS — BP 134/71 | HR 84 | Temp 97.0°F | Resp 18

## 2018-09-28 DIAGNOSIS — N184 Chronic kidney disease, stage 4 (severe): Secondary | ICD-10-CM | POA: Insufficient documentation

## 2018-09-28 DIAGNOSIS — T451X5A Adverse effect of antineoplastic and immunosuppressive drugs, initial encounter: Secondary | ICD-10-CM | POA: Diagnosis not present

## 2018-09-28 LAB — POCT HEMOGLOBIN-HEMACUE: Hemoglobin: 6.5 g/dL — CL (ref 12.0–15.0)

## 2018-09-28 MED ORDER — EPOETIN ALFA 40000 UNIT/ML IJ SOLN
INTRAMUSCULAR | Status: AC
  Filled 2018-09-28: qty 1

## 2018-09-28 MED ORDER — EPOETIN ALFA-EPBX 10000 UNIT/ML IJ SOLN
40000.0000 [IU] | INTRAMUSCULAR | Status: DC
  Administered 2018-09-28: 40000 [IU] via SUBCUTANEOUS
  Filled 2018-09-28: qty 4

## 2018-10-01 MED FILL — Epoetin Alfa Inj 40000 Unit/ML: INTRAMUSCULAR | Qty: 1 | Status: AC

## 2018-10-03 ENCOUNTER — Encounter (HOSPITAL_COMMUNITY): Payer: Medicare HMO

## 2018-10-04 ENCOUNTER — Encounter (HOSPITAL_COMMUNITY): Payer: Medicare HMO

## 2018-10-04 ENCOUNTER — Other Ambulatory Visit: Payer: Self-pay

## 2018-10-04 ENCOUNTER — Ambulatory Visit (HOSPITAL_COMMUNITY)
Admission: RE | Admit: 2018-10-04 | Discharge: 2018-10-04 | Disposition: A | Discharge status: Home / Self Care | Payer: Medicare HMO | Source: Ambulatory Visit | Attending: Nephrology | Admitting: Nephrology

## 2018-10-04 VITALS — BP 128/73 | HR 77 | Temp 97.6°F | Resp 18

## 2018-10-04 DIAGNOSIS — T451X5A Adverse effect of antineoplastic and immunosuppressive drugs, initial encounter: Secondary | ICD-10-CM | POA: Insufficient documentation

## 2018-10-04 DIAGNOSIS — N184 Chronic kidney disease, stage 4 (severe): Secondary | ICD-10-CM | POA: Insufficient documentation

## 2018-10-04 LAB — POCT HEMOGLOBIN-HEMACUE: Hemoglobin: 6.9 g/dL — CL (ref 12.0–15.0)

## 2018-10-04 MED ORDER — EPOETIN ALFA-EPBX 10000 UNIT/ML IJ SOLN
40000.0000 [IU] | Freq: Once | INTRAMUSCULAR | Status: AC
  Administered 2018-10-04: 16:00:00 40000 [IU] via SUBCUTANEOUS
  Filled 2018-10-04: qty 4

## 2018-10-04 MED ORDER — EPOETIN ALFA-EPBX 40000 UNIT/ML IJ SOLN: 40000.0000 [IU] | INTRAMUSCULAR | Status: DC

## 2018-10-04 NOTE — Progress Notes (Signed)
Hemocue 6.9 today.  Pt denies chest pain, shortness of breath, or seeing any bleeding.  Pt stated she is just tired but does not feel bad.  Called and reported the above to [employer] at Dr [employer] office.  No new orders received.

## 2018-10-05 ENCOUNTER — Encounter (HOSPITAL_COMMUNITY): Payer: Medicare HMO

## 2018-10-08 ENCOUNTER — Encounter (HOSPITAL_COMMUNITY): Payer: Medicare HMO

## 2018-10-08 LAB — BK QUANT PCR (PLASMA/SERUM)
BK Quantitaion PCR: NEGATIVE copies/mL
Log10 BK Qn PCR: UNDETERMINED log10copy/mL

## 2018-10-11 ENCOUNTER — Encounter (HOSPITAL_COMMUNITY)
Admission: RE | Admit: 2018-10-11 | Discharge: 2018-10-11 | Disposition: A | Discharge status: Home / Self Care | Payer: Medicare HMO | Source: Ambulatory Visit | Attending: Nephrology | Admitting: Nephrology

## 2018-10-11 ENCOUNTER — Other Ambulatory Visit: Payer: Self-pay

## 2018-10-11 VITALS — BP 140/85 | HR 76 | Temp 98.0°F | Resp 18

## 2018-10-11 DIAGNOSIS — T451X5A Adverse effect of antineoplastic and immunosuppressive drugs, initial encounter: Secondary | ICD-10-CM | POA: Diagnosis not present

## 2018-10-11 DIAGNOSIS — N185 Chronic kidney disease, stage 5: Secondary | ICD-10-CM | POA: Diagnosis present

## 2018-10-11 DIAGNOSIS — D631 Anemia in chronic kidney disease: Secondary | ICD-10-CM | POA: Diagnosis not present

## 2018-10-11 DIAGNOSIS — N184 Chronic kidney disease, stage 4 (severe): Secondary | ICD-10-CM

## 2018-10-11 LAB — POCT HEMOGLOBIN-HEMACUE: Hemoglobin: 7.5 g/dL — ABNORMAL LOW (ref 12.0–15.0)

## 2018-10-11 MED ORDER — EPOETIN ALFA-EPBX 10000 UNIT/ML IJ SOLN
40000.0000 [IU] | INTRAMUSCULAR | Status: DC
  Administered 2018-10-11: 14:00:00 40000 [IU] via SUBCUTANEOUS
  Filled 2018-10-11: qty 4

## 2018-10-18 ENCOUNTER — Other Ambulatory Visit: Payer: Self-pay

## 2018-10-18 ENCOUNTER — Encounter (HOSPITAL_COMMUNITY)
Admission: RE | Admit: 2018-10-18 | Discharge: 2018-10-18 | Disposition: A | Discharge status: Home / Self Care | Payer: Medicare HMO | Source: Ambulatory Visit | Attending: Nephrology | Admitting: Nephrology

## 2018-10-18 VITALS — BP 143/74 | HR 95 | Temp 97.3°F | Resp 18

## 2018-10-18 DIAGNOSIS — T451X5A Adverse effect of antineoplastic and immunosuppressive drugs, initial encounter: Secondary | ICD-10-CM | POA: Diagnosis not present

## 2018-10-18 DIAGNOSIS — N185 Chronic kidney disease, stage 5: Secondary | ICD-10-CM | POA: Diagnosis not present

## 2018-10-18 DIAGNOSIS — N184 Chronic kidney disease, stage 4 (severe): Secondary | ICD-10-CM

## 2018-10-18 LAB — RENAL FUNCTION PANEL
Albumin: 3.9 g/dL (ref 3.5–5.0)
Anion gap: 16 — ABNORMAL HIGH (ref 5–15)
BUN: 56 mg/dL — ABNORMAL HIGH (ref 6–20)
CO2: 15 mmol/L — ABNORMAL LOW (ref 22–32)
Calcium: 8.5 mg/dL — ABNORMAL LOW (ref 8.9–10.3)
Chloride: 111 mmol/L (ref 98–111)
Creatinine, Ser: 7.87 mg/dL — ABNORMAL HIGH (ref 0.44–1.00)
GFR calc Af Amer: 6 mL/min — ABNORMAL LOW (ref >60)
GFR calc non Af Amer: 5 mL/min — ABNORMAL LOW (ref >60)
Glucose, Bld: 103 mg/dL — ABNORMAL HIGH (ref 70–99)
Phosphorus: 5.4 mg/dL — ABNORMAL HIGH (ref 2.5–4.6)
Potassium: 4.6 mmol/L (ref 3.5–5.1)
Sodium: 142 mmol/L (ref 135–145)

## 2018-10-18 LAB — IRON AND TIBC
Iron: 85 ug/dL (ref 28–170)
Saturation Ratios: 36 % — ABNORMAL HIGH (ref 10.4–31.8)
TIBC: 234 ug/dL — ABNORMAL LOW (ref 250–450)
UIBC: 149 ug/dL

## 2018-10-18 LAB — FERRITIN: Ferritin: 593 ng/mL — ABNORMAL HIGH (ref 11–307)

## 2018-10-18 LAB — POCT HEMOGLOBIN-HEMACUE: Hemoglobin: 8 g/dL — ABNORMAL LOW (ref 12.0–15.0)

## 2018-10-18 MED ORDER — EPOETIN ALFA-EPBX 10000 UNIT/ML IJ SOLN
40000.0000 [IU] | Freq: Once | INTRAMUSCULAR | Status: AC
  Administered 2018-10-18: 40000 [IU] via SUBCUTANEOUS
  Filled 2018-10-18: qty 4

## 2018-10-18 MED ORDER — EPOETIN ALFA-EPBX 40000 UNIT/ML IJ SOLN: 40000.0000 [IU] | INTRAMUSCULAR | Status: DC

## 2018-10-20 LAB — TACROLIMUS LEVEL: Tacrolimus (FK506) - LabCorp: 6.7 ng/mL (ref 2.0–20.0)

## 2018-10-25 ENCOUNTER — Other Ambulatory Visit: Payer: Self-pay

## 2018-10-25 ENCOUNTER — Ambulatory Visit (HOSPITAL_COMMUNITY)
Admission: RE | Admit: 2018-10-25 | Discharge: 2018-10-25 | Disposition: A | Discharge status: Home / Self Care | Payer: Medicare HMO | Source: Ambulatory Visit | Attending: Nephrology | Admitting: Nephrology

## 2018-10-25 VITALS — BP 164/82 | HR 101 | Temp 97.3°F | Resp 18

## 2018-10-25 DIAGNOSIS — T451X5A Adverse effect of antineoplastic and immunosuppressive drugs, initial encounter: Secondary | ICD-10-CM | POA: Diagnosis present

## 2018-10-25 DIAGNOSIS — N184 Chronic kidney disease, stage 4 (severe): Secondary | ICD-10-CM | POA: Insufficient documentation

## 2018-10-25 LAB — POCT HEMOGLOBIN-HEMACUE: Hemoglobin: 8 g/dL — ABNORMAL LOW (ref 12.0–15.0)

## 2018-10-25 MED ORDER — EPOETIN ALFA-EPBX 40000 UNIT/ML IJ SOLN
40000.0000 [IU] | INTRAMUSCULAR | Status: DC
  Administered 2018-10-25: 40000 [IU] via SUBCUTANEOUS
  Filled 2018-10-25: qty 1

## 2018-11-01 ENCOUNTER — Ambulatory Visit (HOSPITAL_COMMUNITY)
Admission: RE | Admit: 2018-11-01 | Discharge: 2018-11-01 | Disposition: A | Discharge status: Home / Self Care | Payer: Medicare HMO | Source: Ambulatory Visit | Attending: Nephrology | Admitting: Nephrology

## 2018-11-01 ENCOUNTER — Other Ambulatory Visit: Payer: Self-pay

## 2018-11-01 ENCOUNTER — Ambulatory Visit: Payer: Medicare HMO | Admitting: Obstetrics & Gynecology

## 2018-11-01 VITALS — BP 139/87 | HR 78 | Temp 97.0°F | Resp 18

## 2018-11-01 DIAGNOSIS — N184 Chronic kidney disease, stage 4 (severe): Secondary | ICD-10-CM | POA: Insufficient documentation

## 2018-11-01 DIAGNOSIS — T451X5A Adverse effect of antineoplastic and immunosuppressive drugs, initial encounter: Secondary | ICD-10-CM | POA: Diagnosis present

## 2018-11-01 LAB — POCT HEMOGLOBIN-HEMACUE: Hemoglobin: 8.6 g/dL — ABNORMAL LOW (ref 12.0–15.0)

## 2018-11-01 MED ORDER — EPOETIN ALFA-EPBX 40000 UNIT/ML IJ SOLN
40000.0000 [IU] | INTRAMUSCULAR | Status: DC
  Administered 2018-11-01: 40000 [IU] via SUBCUTANEOUS
  Filled 2018-11-01: qty 1

## 2018-11-07 ENCOUNTER — Encounter (HOSPITAL_COMMUNITY): Payer: Medicare HMO

## 2018-11-08 ENCOUNTER — Encounter (HOSPITAL_COMMUNITY): Payer: Medicare HMO

## 2018-11-14 ENCOUNTER — Encounter (HOSPITAL_COMMUNITY): Payer: Medicare HMO

## 2018-11-19 ENCOUNTER — Ambulatory Visit (HOSPITAL_COMMUNITY)
Admission: RE | Admit: 2018-11-19 | Discharge: 2018-11-19 | Disposition: A | Discharge status: Home / Self Care | Payer: Medicare HMO | Source: Ambulatory Visit | Attending: Nephrology | Admitting: Nephrology

## 2018-11-19 ENCOUNTER — Other Ambulatory Visit: Payer: Self-pay

## 2018-11-19 VITALS — BP 146/84 | HR 80 | Temp 97.2°F | Resp 18

## 2018-11-19 DIAGNOSIS — N184 Chronic kidney disease, stage 4 (severe): Secondary | ICD-10-CM | POA: Insufficient documentation

## 2018-11-19 DIAGNOSIS — T451X5A Adverse effect of antineoplastic and immunosuppressive drugs, initial encounter: Secondary | ICD-10-CM | POA: Diagnosis not present

## 2018-11-19 LAB — IRON AND TIBC
Iron: 133 ug/dL (ref 28–170)
Saturation Ratios: 62 % — ABNORMAL HIGH (ref 10.4–31.8)
TIBC: 214 ug/dL — ABNORMAL LOW (ref 250–450)
UIBC: 81 ug/dL

## 2018-11-19 LAB — RENAL FUNCTION PANEL
Albumin: 3.7 g/dL (ref 3.5–5.0)
Anion gap: 12 (ref 5–15)
BUN: 70 mg/dL — ABNORMAL HIGH (ref 6–20)
CO2: 18 mmol/L — ABNORMAL LOW (ref 22–32)
Calcium: 8.6 mg/dL — ABNORMAL LOW (ref 8.9–10.3)
Chloride: 114 mmol/L — ABNORMAL HIGH (ref 98–111)
Creatinine, Ser: 8.93 mg/dL — ABNORMAL HIGH (ref 0.44–1.00)
GFR calc Af Amer: 5 mL/min — ABNORMAL LOW (ref >60)
GFR calc non Af Amer: 4 mL/min — ABNORMAL LOW (ref >60)
Glucose, Bld: 101 mg/dL — ABNORMAL HIGH (ref 70–99)
Phosphorus: 7 mg/dL — ABNORMAL HIGH (ref 2.5–4.6)
Potassium: 4.5 mmol/L (ref 3.5–5.1)
Sodium: 144 mmol/L (ref 135–145)

## 2018-11-19 LAB — POCT HEMOGLOBIN-HEMACUE: Hemoglobin: 9.8 g/dL — ABNORMAL LOW (ref 12.0–15.0)

## 2018-11-19 LAB — FERRITIN: Ferritin: 520 ng/mL — ABNORMAL HIGH (ref 11–307)

## 2018-11-19 MED ORDER — EPOETIN ALFA-EPBX 40000 UNIT/ML IJ SOLN
40000.0000 [IU] | INTRAMUSCULAR | Status: DC
  Administered 2018-11-19: 40000 [IU] via SUBCUTANEOUS
  Filled 2018-11-19: qty 1

## 2018-11-20 LAB — TACROLIMUS LEVEL: Tacrolimus (FK506) - LabCorp: 3.8 ng/mL (ref 2.0–20.0)

## 2018-12-04 ENCOUNTER — Other Ambulatory Visit: Payer: Self-pay

## 2018-12-04 ENCOUNTER — Ambulatory Visit (HOSPITAL_COMMUNITY)
Admission: RE | Admit: 2018-12-04 | Discharge: 2018-12-04 | Disposition: A | Discharge status: Home / Self Care | Payer: Medicare HMO | Source: Ambulatory Visit | Attending: Nephrology | Admitting: Nephrology

## 2018-12-04 VITALS — BP 148/86 | HR 80 | Temp 97.0°F | Resp 18

## 2018-12-04 DIAGNOSIS — T451X5A Adverse effect of antineoplastic and immunosuppressive drugs, initial encounter: Secondary | ICD-10-CM | POA: Diagnosis not present

## 2018-12-04 DIAGNOSIS — N184 Chronic kidney disease, stage 4 (severe): Secondary | ICD-10-CM | POA: Insufficient documentation

## 2018-12-04 LAB — POCT HEMOGLOBIN-HEMACUE: Hemoglobin: 9.6 g/dL — ABNORMAL LOW (ref 12.0–15.0)

## 2018-12-04 MED ORDER — EPOETIN ALFA-EPBX 40000 UNIT/ML IJ SOLN
40000.0000 [IU] | INTRAMUSCULAR | Status: DC
  Administered 2018-12-04: 40000 [IU] via SUBCUTANEOUS
  Filled 2018-12-04: qty 1
